# Patient Record
Sex: Female | Born: 1950
Health system: Southern US, Community
[De-identification: ages and names within clinical notes are randomized; demographics above are authoritative.]

## PROBLEM LIST (undated history)

## (undated) DIAGNOSIS — I10 Essential (primary) hypertension: Secondary | ICD-10-CM

## (undated) DIAGNOSIS — E7800 Pure hypercholesterolemia, unspecified: Secondary | ICD-10-CM

## (undated) HISTORY — PX: PARTIAL HYSTERECTOMY: SHX80

---

## 1998-03-11 ENCOUNTER — Ambulatory Visit (HOSPITAL_COMMUNITY): Admission: RE | Admit: 1998-03-11 | Discharge: 1998-03-11 | Payer: Self-pay | Admitting: Cardiology

## 1998-03-31 ENCOUNTER — Emergency Department (HOSPITAL_COMMUNITY): Admission: EM | Admit: 1998-03-31 | Discharge: 1998-03-31 | Payer: Self-pay | Admitting: Internal Medicine

## 1999-07-16 ENCOUNTER — Emergency Department (HOSPITAL_COMMUNITY): Admission: EM | Admit: 1999-07-16 | Discharge: 1999-07-16 | Payer: Self-pay | Admitting: Emergency Medicine

## 1999-11-15 ENCOUNTER — Encounter: Payer: Self-pay | Admitting: Cardiology

## 1999-11-15 ENCOUNTER — Ambulatory Visit (HOSPITAL_COMMUNITY): Admission: RE | Admit: 1999-11-15 | Discharge: 1999-11-15 | Payer: Self-pay | Admitting: Cardiology

## 1999-11-25 ENCOUNTER — Encounter: Admission: RE | Admit: 1999-11-25 | Discharge: 1999-11-25 | Payer: Self-pay | Admitting: Cardiology

## 1999-11-25 ENCOUNTER — Encounter: Payer: Self-pay | Admitting: Cardiology

## 2000-01-12 ENCOUNTER — Observation Stay (HOSPITAL_COMMUNITY): Admission: AD | Admit: 2000-01-12 | Discharge: 2000-01-13 | Payer: Self-pay | Admitting: *Deleted

## 2000-01-12 ENCOUNTER — Encounter: Payer: Self-pay | Admitting: *Deleted

## 2000-03-28 ENCOUNTER — Encounter: Payer: Self-pay | Admitting: Emergency Medicine

## 2000-03-28 ENCOUNTER — Inpatient Hospital Stay (HOSPITAL_COMMUNITY): Admission: EM | Admit: 2000-03-28 | Discharge: 2000-03-31 | Payer: Self-pay | Admitting: Emergency Medicine

## 2000-03-29 ENCOUNTER — Encounter: Payer: Self-pay | Admitting: Cardiovascular Disease

## 2000-03-30 ENCOUNTER — Encounter: Payer: Self-pay | Admitting: Cardiovascular Disease

## 2000-04-25 ENCOUNTER — Encounter: Payer: Self-pay | Admitting: Emergency Medicine

## 2000-04-25 ENCOUNTER — Emergency Department (HOSPITAL_COMMUNITY): Admission: EM | Admit: 2000-04-25 | Discharge: 2000-04-25 | Payer: Self-pay | Admitting: Emergency Medicine

## 2000-05-02 ENCOUNTER — Ambulatory Visit (HOSPITAL_COMMUNITY): Admission: RE | Admit: 2000-05-02 | Discharge: 2000-05-02 | Payer: Self-pay | Admitting: Cardiology

## 2001-01-20 ENCOUNTER — Observation Stay (HOSPITAL_COMMUNITY): Admission: EM | Admit: 2001-01-20 | Discharge: 2001-01-22 | Payer: Self-pay | Admitting: *Deleted

## 2001-01-22 ENCOUNTER — Encounter: Payer: Self-pay | Admitting: Cardiovascular Disease

## 2001-10-11 ENCOUNTER — Encounter: Payer: Self-pay | Admitting: Emergency Medicine

## 2001-10-11 ENCOUNTER — Emergency Department (HOSPITAL_COMMUNITY): Admission: EM | Admit: 2001-10-11 | Discharge: 2001-10-11 | Payer: Self-pay | Admitting: Emergency Medicine

## 2001-12-26 ENCOUNTER — Encounter: Payer: Self-pay | Admitting: Cardiology

## 2001-12-26 ENCOUNTER — Encounter: Admission: RE | Admit: 2001-12-26 | Discharge: 2001-12-26 | Payer: Self-pay | Admitting: Cardiology

## 2002-06-23 ENCOUNTER — Inpatient Hospital Stay (HOSPITAL_COMMUNITY): Admission: EM | Admit: 2002-06-23 | Discharge: 2002-07-02 | Payer: Self-pay | Admitting: Emergency Medicine

## 2002-06-23 ENCOUNTER — Encounter: Payer: Self-pay | Admitting: Emergency Medicine

## 2002-06-25 ENCOUNTER — Encounter: Payer: Self-pay | Admitting: Cardiology

## 2002-06-27 ENCOUNTER — Encounter: Payer: Self-pay | Admitting: Cardiology

## 2002-06-29 ENCOUNTER — Encounter: Payer: Self-pay | Admitting: Cardiology

## 2002-07-01 ENCOUNTER — Encounter: Payer: Self-pay | Admitting: Gastroenterology

## 2002-12-13 ENCOUNTER — Ambulatory Visit (HOSPITAL_COMMUNITY): Admission: RE | Admit: 2002-12-13 | Discharge: 2002-12-13 | Payer: Self-pay | Admitting: Gastroenterology

## 2003-07-09 ENCOUNTER — Encounter: Admission: RE | Admit: 2003-07-09 | Discharge: 2003-07-09 | Payer: Self-pay | Admitting: Cardiology

## 2004-04-24 ENCOUNTER — Emergency Department (HOSPITAL_COMMUNITY): Admission: EM | Admit: 2004-04-24 | Discharge: 2004-04-24 | Payer: Self-pay | Admitting: Emergency Medicine

## 2004-08-06 ENCOUNTER — Ambulatory Visit (HOSPITAL_COMMUNITY): Admission: RE | Admit: 2004-08-06 | Discharge: 2004-08-06 | Payer: Self-pay | Admitting: Cardiology

## 2005-06-13 ENCOUNTER — Ambulatory Visit (HOSPITAL_COMMUNITY): Admission: RE | Admit: 2005-06-13 | Discharge: 2005-06-13 | Payer: Self-pay | Admitting: Cardiology

## 2005-07-13 ENCOUNTER — Encounter: Admission: RE | Admit: 2005-07-13 | Discharge: 2005-07-13 | Payer: Self-pay | Admitting: Occupational Medicine

## 2005-07-18 ENCOUNTER — Ambulatory Visit (HOSPITAL_COMMUNITY): Admission: RE | Admit: 2005-07-18 | Discharge: 2005-07-18 | Payer: Self-pay | Admitting: Cardiology

## 2005-09-05 ENCOUNTER — Ambulatory Visit (HOSPITAL_COMMUNITY): Admission: RE | Admit: 2005-09-05 | Discharge: 2005-09-05 | Payer: Self-pay | Admitting: Sports Medicine

## 2005-09-26 ENCOUNTER — Encounter: Admission: RE | Admit: 2005-09-26 | Discharge: 2005-09-26 | Payer: Self-pay | Admitting: Sports Medicine

## 2005-10-31 ENCOUNTER — Encounter: Admission: RE | Admit: 2005-10-31 | Discharge: 2005-10-31 | Payer: Self-pay | Admitting: Cardiology

## 2006-05-21 ENCOUNTER — Inpatient Hospital Stay (HOSPITAL_COMMUNITY): Admission: EM | Admit: 2006-05-21 | Discharge: 2006-05-23 | Payer: Self-pay | Admitting: Emergency Medicine

## 2006-05-29 ENCOUNTER — Encounter: Admission: RE | Admit: 2006-05-29 | Discharge: 2006-05-29 | Payer: Self-pay | Admitting: Cardiology

## 2006-07-10 ENCOUNTER — Ambulatory Visit (HOSPITAL_BASED_OUTPATIENT_CLINIC_OR_DEPARTMENT_OTHER): Admission: RE | Admit: 2006-07-10 | Discharge: 2006-07-10 | Payer: Self-pay | Admitting: Cardiology

## 2006-07-16 ENCOUNTER — Ambulatory Visit: Payer: Self-pay | Admitting: Internal Medicine

## 2007-06-18 ENCOUNTER — Encounter: Admission: RE | Admit: 2007-06-18 | Discharge: 2007-06-18 | Payer: Self-pay | Admitting: Cardiology

## 2007-12-24 ENCOUNTER — Inpatient Hospital Stay (HOSPITAL_COMMUNITY): Admission: EM | Admit: 2007-12-24 | Discharge: 2007-12-27 | Payer: Self-pay | Admitting: Emergency Medicine

## 2008-01-11 ENCOUNTER — Ambulatory Visit (HOSPITAL_COMMUNITY): Admission: RE | Admit: 2008-01-11 | Discharge: 2008-01-11 | Payer: Self-pay | Admitting: Gastroenterology

## 2008-07-28 ENCOUNTER — Encounter: Admission: RE | Admit: 2008-07-28 | Discharge: 2008-07-28 | Payer: Self-pay | Admitting: Cardiology

## 2008-10-29 ENCOUNTER — Ambulatory Visit (HOSPITAL_COMMUNITY): Admission: RE | Admit: 2008-10-29 | Discharge: 2008-10-29 | Payer: Self-pay | Admitting: Cardiology

## 2009-09-28 ENCOUNTER — Encounter: Admission: RE | Admit: 2009-09-28 | Discharge: 2009-09-28 | Payer: Self-pay | Admitting: Cardiology

## 2010-07-08 ENCOUNTER — Emergency Department (HOSPITAL_COMMUNITY)
Admission: EM | Admit: 2010-07-08 | Discharge: 2010-07-09 | Payer: Self-pay | Source: Home / Self Care | Admitting: Emergency Medicine

## 2010-08-21 ENCOUNTER — Encounter: Payer: Self-pay | Admitting: Cardiology

## 2010-08-22 ENCOUNTER — Encounter: Payer: Self-pay | Admitting: Cardiology

## 2010-08-23 ENCOUNTER — Encounter: Payer: Self-pay | Admitting: Cardiology

## 2010-10-12 LAB — BASIC METABOLIC PANEL
CO2: 27 mEq/L (ref 19–32)
Calcium: 9.6 mg/dL (ref 8.4–10.5)
GFR calc Af Amer: 35 mL/min — ABNORMAL LOW (ref >60)
Sodium: 141 mEq/L (ref 135–145)

## 2010-10-12 LAB — DIFFERENTIAL
Eosinophils Absolute: 0.5 10*3/uL (ref 0.0–0.7)
Lymphocytes Relative: 34 % (ref 12–46)
Lymphs Abs: 2.5 10*3/uL (ref 0.7–4.0)
Monocytes Relative: 10 % (ref 3–12)
Neutro Abs: 3.7 10*3/uL (ref 1.7–7.7)
Neutrophils Relative %: 50 % (ref 43–77)

## 2010-10-12 LAB — CBC
Hemoglobin: 12.7 g/dL (ref 12.0–15.0)
MCH: 29.3 pg (ref 26.0–34.0)
RBC: 4.33 MIL/uL (ref 3.87–5.11)
WBC: 7.4 10*3/uL (ref 4.0–10.5)

## 2010-10-19 ENCOUNTER — Other Ambulatory Visit: Payer: Self-pay | Admitting: Cardiology

## 2010-10-19 DIAGNOSIS — Z1231 Encounter for screening mammogram for malignant neoplasm of breast: Secondary | ICD-10-CM

## 2010-10-27 ENCOUNTER — Ambulatory Visit
Admission: RE | Admit: 2010-10-27 | Discharge: 2010-10-27 | Disposition: A | Discharge status: Home / Self Care | Payer: PRIVATE HEALTH INSURANCE | Source: Ambulatory Visit | Attending: Cardiology | Admitting: Cardiology

## 2010-10-27 DIAGNOSIS — Z1231 Encounter for screening mammogram for malignant neoplasm of breast: Secondary | ICD-10-CM

## 2010-12-14 NOTE — Consult Note (Signed)
Tamara Friedman, REDDOCH                ACCOUNT NO.:  [id number]   MEDICAL RECORD NO.:  [id number]          PATIENT TYPE:  INP   LOCATION:  [id number]                         FACILITY:  [facility]   PHYSICIAN:  Tory Emerald. Benson Norway, MD    DATE OF BIRTH:  07-17-1951   DATE OF CONSULTATION:  12/26/2007  DATE OF DISCHARGE:                                 CONSULTATION   REFERRING PHYSICIAN:  Dr. Wallene Huh.   REASON FOR CONSULTATION:  Hematochezia.   HISTORY OF PRESENT ILLNESS:  This is a 60 year old female with a past  medical history of malignant hypertension and renal insufficiency who is  admitted to the hospital with complaints of acute nausea and vomiting.  Apparently the patient was at a cookout, and at that time she started to  feel ill.  She subsequently developed nausea and vomiting, and she  thought it would pass.  Unfortunately her symptoms continued to persist,  and she presented to the emergency room for further evaluation and  treatment at that time.  She was found to have an elevated blood  pressure with systolic at 123XX123 and diastolic at Q000111Q.  Because of her  symptoms she was subsequently admitted by Dr. Montez Morita, and transferred  to the intensive care unit for a hypertensive emergency.  Since being  hospitalized the patient does complain of having issues with  constipation and hematochezia that started approximately two months ago.  In the past she had a colonoscopy performed for her family history of  colon cancer.  During that examination she reports negative finding.  She denies having any prior hematochezia before this time, and she felt  that it would resolve on its own.  However, it continues to persist  every time she has a difficult bowel movement.  She does have a history  of constipation in the past.  At this time the patient does report  feeling better in regards to having her nausea and vomiting, although  nausea is still slightly persistent.  Additionally she complains of  having bilateral lower abdominal pain which started this past Sunday.  No prior pain before this hypertensive emergency.   PAST MEDICAL HISTORY/PAST SURGICAL HISTORY:  As stated above.   FAMILY HISTORY:  Significant for colon cancer in her mother.   SOCIAL HISTORY:  Negative for alcohol, tobacco or illicit drug use.   ALLERGIES:  No known drug allergies.   MEDICATIONS:  1. Avapro 300 mg p.o. daily.  2. Catapres 0.2 mg p.o. b.i.d.  3. Potassium chloride 20 mEq one p.o. daily.  4. Lasix 40 mg one p.o. daily.  5. Lovenox 70 mg subcu q.12 h.  6. Norvasc 5 mg one p.o. daily.  7. Tekturna 300 mg one p.o. daily.  8. Toprol 50 mg one p.o. b.i.d.  9. Ambien 10 mg one p.o. q.h.s.  10.Xanax 1 mg one p.o. t.i.d.  11.Zofran 4 mg IV q.6 h. p.r.n.   REVIEW OF SYSTEMS:  As stated above in the History of Present Illness;  otherwise, negative.   PHYSICAL EXAMINATION:  VITAL SIGNS:  Blood pressure 124/68, heart rate  60,  respirations 18, temperature 97.8.  GENERAL:  The patient is in no acute distress, alert and oriented.  HEENT:  Normocephalic, atraumatic, extraocular muscles intact.  NECK:  Supple, no lymphadenopathy.  LUNGS:  Clear to auscultation bilaterally.  CARDIOVASCULAR:  Regular rate and rhythm.  ABDOMEN:  Obese, soft, mild tenderness in the bilateral lower quadrants,  no rebound or rigidity, positive bowel sounds.  RECTAL:  Heme-negative.  There is solid stool palpated in the rectal  vault, no masses.  EXTREMITIES:  No clubbing, cyanosis or edema.   LABORATORY VALUES:  White blood cell count 7, hemoglobin 12.3, platelets  210.  Sodium 142, potassium 4.6, chloride 107, CO2 22, BUN 36,  creatinine 1.6, glucose 139.   IMPRESSION:  1. Hematochezia.  2. Constipation.  3. Family history of colon cancer.  4. Malignant hypertension.   PLAN:  1. At the time it appears that she is hemodynamically stable.  I was      not able to detect any blood at this time.  However, further       evaluation is required.  She does have a family history of colon      cancer.  I believe her bleeding is a result of her straining from      her constipation issues.  The pain is new in onset, and I do not      believe it is associated with her current complaints of      hematochezia.  I am not certain as to the exact origin of this pain      at this time.  Her blood pressure is under much better control.      However, I would like to ensure that this is on a consistent basis.      The GI workup does not have to be performed emergently as her      hemoglobin is relatively stable.  I feel that the drop from 15 to      12 in her hemoglobin is as a result of dilutional process and not      actual GI bleeding.  Plan at this time is to hold off on      colonoscopy until blood pressure is under stable control, and again      the colonoscopy will be performed on an outpatient basis.  2. During this hospitalization I will start her on MiraLax 17 g one      p.o. b.i.d. to help with the constipation issue.  At this time I      will follow along.      Tory Emerald Benson Norway, MD  Electronically Signed     PDH/MEDQ  D:  12/26/2007  T:  12/26/2007  Job:  [id number]   cc:   Ardyth Gal. Spruill, M.D.  Fax: [phone]

## 2010-12-17 NOTE — Discharge Summary (Signed)
NAME:  Tamara Friedman, Tamara Friedman                          ACCOUNT NO.:  [id number]   MEDICAL RECORD NO.:  [id number]                   PATIENT TYPE:  INP   LOCATION:  [id number]                                 FACILITY:  [facility]   PHYSICIAN:  Ardyth Gal. Spruill, M.D.             DATE OF BIRTH:  1951-04-29   DATE OF ADMISSION:  06/23/2002  DATE OF DISCHARGE:  07/02/2002                                 DISCHARGE SUMMARY   ADMISSION DIAGNOSES:  1. Malignant hypertension.  2. Acute pancreatitis.  3. Mild renal insufficiency.  4. Leg pain.  5. Ethanol abuse.   DISCHARGE DIAGNOSES:  1. Accelerated malignant hypertension.  2. Acute pancreatitis.  3. ETOH abuse.  4. Mild renal insufficiency.  5. Noncompliance.  6. Nonocclusive coronary artery disease.   BRIEF HISTORY/REASON FOR ADMISSION:  This 60 year old white woman who is  rather noncompliant presented to the hospital emergency room complaining of  abdominal pain.  The patient was admitted and evaluated by Dr. Kevan Ny in  my absence.  The patient had been prescribed medications of clonidine,  hydrochlorothiazide, and Toprol, of which she took sporadically.  On initial  presentation the patient was found to have a blood pressure of 254/154 and  only decreased to 170/110 after some initial treatment.  The patient  complained of some mild headache and some other minor difficulties on  presentation and also complained of some right lower quadrant abdominal  pain.  Due to this patient's history and her initial complaints, she was  admitted to the hospital for further evaluation.  The patient's laboratory  studies revealed the following:  On November 23 white blood cell count 5100,  hemoglobin 14.4, hematocrit 43.2.  At discharge the white blood cell count  was 4000, hemoglobin 12.6, and hematocrit 36.8.  The serum sodium on initial  presentation was 139, potassium 3.8, chloride 103, CO2 content 29, glucose  74, BUN 39, creatinine 1.9.  At discharge  the patient's serum sodium was  134, potassium 4.3, chloride 102, CO2 content 26, glucose 113, BUN 24.  Serum amylase on June 23, 2002 was 297.  Serum lipase was 63.  The  amylase initially increased from 237 to a high of 386, then began to  decrease on December 2 down to 303.   The patient had cardiac enzymes which were unremarkable.  Her lipid profile  revealed a low HDL of 39.  The patient had urine for metanephrines and  catecholamines, which were unremarkable.  Her urine creatinine was 71.  She  had a urine volume of 1200 cc.  The patient's initial urinalysis on June 23, 2002, revealed protein 100 mg.  Subsequent repeat on the following day  generally revealed evidence of protein.  She had greater than 100,000  colonies of urine on the urine culture.  Multiple species were identified,  however.   Electrocardiogram revealed normal sinus rhythm with a first degree AV block,  left atrial enlargement, left ventricular hypertrophy, and a T-wave  abnormality consistent with an inferior ischemia.  Subsequent  electrocardiograms failed to reveal any significant changes.   HOSPITAL COURSE:  The patient was admitted to the hospital with decelerated  hypertension.  Multiple medication adjustments were made to control the  patient's very high blood pressure.  She was initially admitted to the  intensive care unit for acute treatment and extensive follow-up.   The patient was admitted by Dr. Marlou Sa, started on nitroglycerin patches as  well as multiple other medications for her blood pressure.  She was given  clonidine 0.2 mg b.i.d., Norvasc 10 mg daily, hydrochlorothiazide 25 mg  daily, and Toprol XL 50 mg p.o. b.i.d.   A serum amylase was obtained as the patient has a family history of  alcoholism and did complain of some midepigastric and right lower quadrant  abdominal pain.   Additional efforts were made to evaluate whether or not the patient had some  treatable cause of  hypertension, and included vanillylmandelic acid  catecholamines, which were essentially unremarkable.   Diagnostic evaluation of the patient's problem also included radiologic  studies which included an abdominal ultrasound, which revealed no evidence  of gallstones and normal echogenic kidneys.  No specific problems were  identified, even on the patient's acute abdominal series, which revealed a  moderate amount of fecal material within the colon.  The patient's chest x-  ray, however, did reveal mild cardiomegaly, mild probable bronchitis.  No  acute disease was appreciated.   The patient gradually began to respond very slowly to medications for her  hypertension.  Her clonidine had to be increased to 0.4 mg at night and 0.2  mg in the day.  Her Toprol was also increased.   The patient had some mild renal insufficiency on initial presentation.  Eventually the patient's medication became quite effective and began to  control her pressure.   She gradually improved and was able to take in some nutrition, as initially  the patient had become quite anorexic.   The patient slowly began to improve with time.  She began to eat more  without additional episodes of abdominal pain, and it was felt the patient  had likely reached maximum hospital benefit.   The patient on a telemetry report, seemed to have experienced a short run of  semiventricular tachycardia which was seen in the hospitalization but was  not repeated.  This is noted on one occasion while hospitalized, and the  date of this tracing was reported as June 24, 2002.   The patient had no additional episodes of this particular phenomenon.   The patient at the time of discharge appeared stable.  Blood pressure was  stable.  She stated that she would not consume any additional alcohol and  would be sure to be compliant with her medication, as this particular episode frightened her.  The patient will be released home with  office  follow-up in approximately one week and additional evaluation and  adjustments on medications will be performed at that time.  She will be  discharged on Norvasc, clonidine, and Toprol at this particular time.  She  will also have potassium chloride 20 mg, hydrochlorothiazide 25 mg, Protonix  40 mg, Ativan 1 mg, Phenergan and Darvocet as well.  The patient was  scheduled to see Dr. Vennie Homans for additional evaluation after her discharge.   In brief summary this 60 year old woman was admitted to the hospital for  acute pancreatitis,  also found to have severe malignant hypertension.  All  of these problems were addressed while the patient was hospitalized and she  had  also some difficulty with alcohol abuse and depression, and some mild renal  insufficiency while hospitalized.  She has improved, was released home, will  be followed up by gastroenterologist, and will be seen in my office in  approximately 2-3 weeks.                                               Ardyth Gal. Spruill, M.D.    JOS/MEDQ  D:  08/14/2002  T:  08/14/2002  Job:  [id number]

## 2010-12-17 NOTE — Discharge Summary (Signed)
Tamara Friedman, Tamara Friedman                ACCOUNT NO.:  [id number]   MEDICAL RECORD NO.:  [id number]          PATIENT TYPE:  INP   LOCATION:  [place]                         FACILITY:  [facility]   PHYSICIAN:  Ardyth Gal. Spruill, M.D.DATE OF BIRTH:  02-Nov-1950   DATE OF ADMISSION:  05/21/2006  DATE OF DISCHARGE:  05/23/2006                               DISCHARGE SUMMARY   ADMISSION/PROVISIONAL DIAGNOSES:  1. Malignant hypertension.  2. Congestive heart failure.  3. Nausea and vomiting.  4. Headaches.  5. Hypokalemia.  6. Abnormal electrocardiogram.  7. Medications noncompliance.  8. Renal insufficiency.  9. Increased CBG's.   DISCHARGE DIAGNOSES:  1. Malignant hypertension.  2. Congestive heart failure.  3. Hypokalemia.  4. Abnormal electrocardiogram.  5. Renal insufficiency.   BRIEF HISTORY AND REASON FOR ADMISSION:  This is one of several  admissions for this 60 year old black woman with a history of severe  hypertension and noncompliance.  The patient has been on multiple  medications for her hypertension and episodically takes them.  She  reports that she had ran out of medicine approximately two weeks prior  to coming to the hospital.   The patient complained of mild to severe headache, also had complaints  of weakness and some dyspnea and some mild leg swelling.  Due to these  complaints, she was admitted to the hospital for further evaluation and  treatment. When the patient initially presented to the hospital  emergency room her pressure was found to be extremely high and was  recorded in the emergency department well over [id number] systolic and 123456  diastolic blood pressure.  Her weight was 144 pounds.  The patient's  blood pressure was actually 202/135.  By the time the patient left the  emergency department the blood pressure had dipped down to 159/99.   LABORATORY DATA:  Initial laboratory studies revealed the following:  Troponin-I was 0.08.  The urinalysis revealed trace  myoglobin, greater  than 300 mg of protein, a few epithelial cells.  The cholesterol was  recorded at 164 for the patient's total, the LDL cholesterol was 98.  The liver enzymes were all unremarkable.  The B-naturetic peptide was  1,690.  The white blood cell count on May 21, 2006 was 5.4,  hemoglobin 13.6, hematocrit 40.8, and at discharge the hemoglobin was  12.0, hematocrit 35.7, white blood cell count was 5,300.  On initial  presentation as well, on May 21, 2006, the serum sodium was 138,  potassium was 3.0, chloride 101, CO2 content 27, glucose 108, BUN 24,  creatinine 1.7.  At the time of discharge on May 23, 2006 the serum  sodium was 138, potassium 4.1, chloride 109, CO2 content 24, glucose  100, BUN 33, creatinine was 1.9.  The electrocardiogram on initial  presentation revealed biatrial enlargement, left ventricular  hypertrophy, T wave abnormalities consistent with inferior ischemia, T  wave abnormality possible anterior lateral ischemia as well, slightly  prolonged QT interval.   HOSPITAL COURSE:  The patient was admitted to the hospital by Dr. Kevan Ny in my absence.  Dr. Marlou Sa initially evaluated the patient with  baseline laboratory studies which are given in the body of the chart.  She was started on a full liquid diet and was started on a drip for her  blood pressure.  The patient was also given Diovan 320 mg daily,  Clonidine 0.2 mg p.o. b.i.d., Norvasc 5 mg p.o. daily and had a dramatic  decrease in her blood pressure.   She was also given medication for pain and as she was treated for her  blood pressure, her headaches and other symptoms improved.  The patient  was placed on a Nipride drip to keep her systolic between Q000111Q and 0000000  mmHg and diastolic between 90 and 123XX123.   Cardiac enzymes were obtained and did reveal a slight increase in the  patient's troponin-I but this was not thought to be due to myocardial  infarction but may be evidence rather of  heart failure and renal  insufficiency as contributing causes.   The patient's nitroprusside was subsequently tapered down and turned off  as she continued to improve.  She was also noted to have hypokalemia and  this was addressed by Dr. Marlou Sa and given IV potassium.   The patient was placed on Catapres and her blood pressure responded  quite well to this regimen.  She was subsequently transferred to  telemetry on May 22, 2006.  Foley catheter was discontinued.  Followup laboratory did reveal evidence of hypokalemia which was now  corrected and the patient felt she had reached maximal hospital benefit.   It is of note that the patient had a questionable mass in her breast.  A  mammogram was requested.  In addition to this, the nurses reported  apneic periods and an outpatient sleep study was also scheduled.   The patient gradually improved.  Her blood pressure came under better  control and it was felt that she could be released home with office  followup and sample administration by me in the office.  She was  discharged on May 23, 2006 on the following medications:   DISCHARGE MEDICATIONS:  1. Clonidine 0.2 mg p.o. b.i.d.  2. K-Dur 20 mEq p.o. b.i.d.  3. Norvasc 5 mg p.o. q.a.m.  4. Diovan 320 mg p.o. daily.  5. Lopressor 12.5 mg p.o. daily.  6. Protonix 40 mg p.o. daily.   DIET:  She also will be discharged on a 2 gram sodium restricted diet.  No fluid restrictions have been used at this time.   FOLLOWUP:  The patient was given instructions to come by my office for  samples and we will follow her up in my office within one to two weeks.  She will also be given home health care to monitor her blood pressure as  well.     Ardyth Gal. Spruill, M.D.  Electronically Signed    JOS/MEDQ  D:  06/07/2006  T:  06/08/2006  Job:  [id number]

## 2010-12-17 NOTE — Discharge Summary (Signed)
[facility]  Patient:    Tamara Friedman, Tamara Friedman                       MRN: [id number] Adm. Date:  [id number] Disc. Date: [id number] Attending:  Birdie Riddle CC:         Ardyth Gal. Spruill, M.D.   Discharge Summary  PRINCIPAL DIAGNOSES: 1. Accelerated hypertension. 2. Cervical and lumbar sprain. 3. Anxiety.  DISCHARGE MEDICATIONS: 1. Clonidine 0.2 mg 1 three times daily. 2. Hydrochlorothiazide 12.5 mg 1 daily. 3. Xanax 0.25 mg 1 daily. 4. K-Dur 10 mEq 1 daily. 5. Flexeril 10 mg 1/2 tablet 1 twice daily. 6. Percocet 1 p.o. q.i.d. p.r.n. 7. Norvasc 10 mg 1 p.o. daily. 8. Ambien 10 mg 1 at bedtime.  DISCHARGE ACTIVITY:  The patient is to ambulate as tolerated.  DISCHARGE DIET:  Low-fat, low-salt diet.  SPECIAL INSTRUCTIONS:  The patient is to use heating pad at medium heat for 30 minutes twice daily to low back area.  FOLLOWUP:  Dr. Awanda Mink Spruill in two weeks.  The patient is to call [phone] for an appointment.  HISTORY:  This 60 year old black female presented to the emergency room with dizziness, shoulder and back pain.  The patient was found to have blood pressure of 230/133.  She admitted to dietary noncompliance.  PHYSICAL EXAMINATION:  VITAL SIGNS:  Temperature 98, pulse 73, respirations 20, blood pressure 230/133.  Height 5 feet 6 inches, weight 135 pounds.  GENERAL:  The patient is alert and oriented x 3.  HEAD:  Normocephalic, atraumatic.  EYES: Dark Leven with pupils are equal and reactive to light.  Extraocular movements intact.  Conjunctivae pink.  Sclerae nonicteric.  EARS, NOSE, THROAT:  Mucous membranes pink and moist.  NECK:  No JVD, no carotid bruits.  LUNGS:  Clear to auscultation.  HEART:  Normal S1, S2 without murmur, gallop, or rub.  ABDOMEN:  Soft and nontender.  EXTREMITIES:  No edema, cyanosis, clubbing.  CNS:  Cranial nerves II-XII grossly intact.  Bilaterally full grips.  The patient moves all four  extremities.  LABORATORY DATA:  Normal hemoglobin, hematocrit, WBC and platelet count. Normal electrolytes.  BUN slightly high at 39.  Creatinine slightly high at 1.7.  Troponin I 0.03, CK 172, MB 2.9.  Subsequent CK and MB were low also.  Chest x-ray: No acute process.  Chest CT: No dissection.  X-ray of the C spine and lumbar spine essentially unremarkable with a mild degenerative joint disease.  HOSPITAL COURSE:  The patient was admitted to telemetry unit.  She was started on IV nitrite drip.  She responded very well to the nitrite drip overnight, and her home medications of Clonidine, Norvasc, and additionally Toprol XL were started.  The patient had good blood pressure control; however, she continued to have neck pain and back pain with suspicion of atypical angina. The patient underwent adenosine Cardiolite stress test that showed 30% ejection fraction with no perfusion defect.  The patient had daily counselling of dietary compliance; and as a matter of fact, she works in the dietary department.  Her medication doses were adjusted with the finding of first degree and second degree heart blocks, and on March 31, 2000, she was discharged home in satisfactory condition with followup by Dr. Wallene Huh in two weeks. DD:  03/31/00 TD:  04/01/00 Job: [id number]

## 2010-12-17 NOTE — Procedures (Signed)
[place]. [facility]  Patient:    Tamara Friedman, Tamara Friedman                       MRN: [id number] Proc. Date: 01/12/00 Adm. Date:  [id number] Disc. Date: [id number] Attending:  Woody Seller                           Procedure Report  SUBJECTIVE:  This pleasant 60 year old female was brought into the hospital for a family history of colon neoplasm.  The patient has been relatively stable, but a family member was recently diagnosed as having colon cancer. Because of this, the patient came in for an evaluation.  There has been no major history of any change in stool character or color.  No history of any weight loss or abnormalities that were noted at this time.  The family members was a first-generation relative to the patient, which made for increased concern for them from this standpoint.  OBJECTIVE:  She was a pleasant female in no acute distress.  Her vital signs were stable.  Her HEENT examination was anicteric.  The neck was supple.  The lungs were clear.  The heart had a regular rate and rhythm without any murmurs, rubs, or gallops.  The abdomen was soft with no tenderness and no hepatosplenomegaly.  The extremities were within normal limits.  PLAN:  I am presently going to proceed with the colonoscopic examination at this time to ensure no evidence of any neoplastic process that is ongoing. Depending on the results will determine the course of therapy.  INFORMED CONSENT:  The patient was advised of the procedure and the indications and risks involved.  A video was reviewed and consent form obtained.  PREOPERATIVE PREPARATION:  The patient was brought into the endoscopy unit where an IV for IV sedating medication was started.  A monitor was placed on the patient to monitor the patients vital signs and oxygen saturation.  Nasal oxygen at 2 L/min was used and after adequate sedation was performed, the procedure was begun.  BOWEL PREPARATION:  The patient was  given GoLYTELY and Reglan as a bowel prep. The patient tolerated the prep well without any complications.  The quality of the prep was excellent.  DESCRIPTION OF PROCEDURE:  The instrument was advanced with the patient lying in the left lateral position.  There were two polyps approximately 65-70 cm from the proximal colon to the cecal region.  This was confirmed by palpation, transillumination, as well as visualization of the ileoappendiceal orifice.  There appeared to be no gross abnormalities, such as masses, polyps, or stricture lesions appreciated.  The vascular pattern appeared to be well within normal limits throughout the entire colon.  The mucosal pattern showed no evidence of any granular changes and no diverticular changes at this time.  There was evidence of increased tortuosity, especially in the more distal portion of the colon area so that the regular colonoscope was difficult to manipulate.  After advancing approximately 30 cm, the instrument was retracted back and replaced with the pediatric colonoscope for advancement of the instrument all the way through to the cecal region.  The patient tolerated the procedure well with some discomfort, but no major complications noted at this time.  The instrument was removed per rectum without any difficulty.  No evidence of any internal or external hemorrhoids noted.  TREATMENT: 1. Conservative management at this time. 2. Will have  the patient follow up with me in the office. 3. Will recommend repeating the procedure in approximately three years during    the interim. DD:  01/12/00 TD:  01/17/00 Job: [id number]

## 2010-12-17 NOTE — H&P (Signed)
[place]. [facility]  Patient:    Tamara Friedman, Tamara Friedman                       MRN: [id number] Adm. Date:  [id number] Attending:  Woody Seller                         History and Physical  HISTORY OF PRESENT ILLNESS:  This pleasant 59 year old female is admitted into the hospital for 23-hour observation post colonoscopy. She was brought in for evaluation for a primary family history of colon cancer that is noted. She has had no major discomforts that is present at this time. She was unable to arrange transportation to come pick her up at this time; and thus, the patient will be admitted until such time that someone can get her and take her home between tonight and tomorrow morning.  PHYSICAL EXAMINATION:  GENERAL:  She is a pleasant female who appears to be in no acute distress at this point.  VITAL SIGNS:  Stable.  HEENT:  Anicteric.  NECK:  Supple.  LUNGS:  Clear.  HEART:  Regular rate and rhythm without any thrills, murmurs, or gallops.  ABDOMEN:  Soft. No tenderness. No hepatosplenomegaly.  EXTREMITIES:  Appear to be unremarkable.  PROCEDURE:  The patient underwent a colonoscopic examination. Results showed evidence of a normal colon but increased tortuosity that is noted at this time. No biopsies were taken at this time.  PLAN:  The patient will be admitted in for 23-hour observation at this time. She will be discharged when transportation arrives between tonight and tomorrow morning if there are no complications at this point. Routine labs will be obtained; and if the results are okay, discharge will be imminent. DD:  01/12/00 TD:  01/12/00 Job: [id number]

## 2010-12-17 NOTE — Op Note (Signed)
NAME:  Tamara Friedman, Tamara Friedman                          ACCOUNT NO.:  [id number]   MEDICAL RECORD NO.:  [id number]                   PATIENT TYPE:  AMB   LOCATION:  ENDO                                 FACILITY:  [facility]   PHYSICIAN:  Nelwyn Salisbury, M.D.               DATE OF BIRTH:  04/03/1951   DATE OF PROCEDURE:  12/13/2002  DATE OF DISCHARGE:                                 OPERATIVE REPORT   PROCEDURE:  Screening colonoscopy.   ENDOSCOPIST:  Nelwyn Salisbury, M.D.   INSTRUMENT USED:  Olympus video colonoscope.   INDICATION FOR PROCEDURE:  A 60 year old African-American female with a  family history of colon cancer and recent rectal bleeding.  Rule out colonic  polyps, masses, etc.   PREPROCEDURE PREPARATION:  Informed consent was procured from the patient.  The patient had fasted for eight hours prior to the procedure and prepped  with a bottle of magnesium citrate and a gallon of GoLYTELY the night prior  to the procedure.   PREPROCEDURE PHYSICAL:  VITAL SIGNS:  The patient had stable vital signs  except for a blood pressure of 170-180/116-120.  NECK:  Supple.  CHEST:  Clear to auscultation.  S1, S2 regular.  ABDOMEN:  Soft with normal bowel sounds.   DESCRIPTION OF PROCEDURE:  The patient was placed in the left lateral  decubitus position and sedated with 70 mg of Demerol and 7 mg of Versed  intravenously.  Once the patient was adequately sedate and maintained on low-  flow oxygen and continuous cardiac monitoring, the Olympus video colonoscope  was advanced from the rectum to the cecum without difficulties.  The  appendiceal orifice and the ileocecal valve were clearly visualized and  photographed.  Small internal hemorrhoids were seen on retroflexion.  No  masses, polyps, erosions, ulcerations, or diverticula were appreciated.   IMPRESSION:  Essentially normal colonoscopy up to the cecum except for small  internal hemorrhoids.   RECOMMENDATIONS:  1. A high-fiber diet has  been recommended with liberal fluid intake.  Stool     softeners are to be used for bouts of constipation as needed.  2. Repeat CRC screening is recommended in the next five years unless the     patient develops any abnormal symptoms in the interim.  3.     Outpatient follow-up in the next two weeks or earlier if need be.  4. Follow up with Dr. Wallene Huh ASAP for better control of blood     pressure and maybe adjustment of her antihypertensive medication.                                               Nelwyn Salisbury, M.D.    JNM/MEDQ  D:  12/13/2002  T:  12/14/2002  Job:  [id number]   cc:   Ardyth Gal. Spruill, M.D.  [address] [address]  Fax: [phone]

## 2010-12-17 NOTE — Discharge Summary (Signed)
NAMEALLAYA, SONG                ACCOUNT NO.:  [id number]   MEDICAL RECORD NO.:  [id number]          PATIENT TYPE:  INP   LOCATION:  [id number]                         FACILITY:  [facility]   PHYSICIAN:  Ardyth Gal. Spruill, M.D.DATE OF BIRTH:  1950-12-24   DATE OF ADMISSION:  12/24/2007  DATE OF DISCHARGE:  12/27/2007                               DISCHARGE SUMMARY   ADMISSION DIAGNOSES:  1. Malignant hypertension with hypertensive emergency.  2. Questionable renal artery stenosis.  3. Gastrointestinal bleeding.   DISCHARGE DIAGNOSIS:  1. Malignant hypertension.  2. Blood in the stool.  3. Nausea and vomiting.  4. Unspecified constipation.   BRIEF HISTORY AMD REASON FOR ADMISSION:  This is one of several  admissions for this 60 year old black woman who presented to the  hospital complaining of nausea and vomiting.  The patient was initially  evaluated by the emergency room physician with a complaint of abdominal  pain, nausea and vomiting.  While in the emergency room, she was found  to have a blood pressure of 275/172.  She denied any headache or chest  pain.  She was subsequently admitted to the hospital for control of her  severely elevated blood pressure and further evaluation of her abdominal  pain, and her complaint of blood in her stool.   LABORATORY STUDIES:  Laboratory studies revealed the following, the  patient's urinalysis on Dec 26, 2007 revealed small amount of leukocyte  esterase.  She had serum myoglobin on Dec 24, 2007 was 174, CK-MB was  2.6 and troponin was less than 0.05   The patient had VMA and metanephrine testing.  The vanillymandelic acid  was 4.0.  This is within the normal reference range of 1.8-6.7.  Urine  volume was 1350 mL.  Metanephrines in the urine revealed 102.  This was  in the normal range of 90-315 mcg per 24 hours, normal metanephrines  285, reference range 122-676 and total metanephrines in the urine was  387, normal range 224-832.   The  patient had a urine drug screen, which revealed a positive result  for benzodiazepines, a positive result for opiate.  Urine microalbumin  1.15 within the normal range, serum cholesterol revealed HDL cholesterol  of 34, VLDL cholesterol of 17 and a LDL cholesterol of 107, and  triglycerides 85.   Cardiac enzymes revealed total CK of 106, relative index was 3.0.  Subsequent CKs 116 in 1997, no evidence of myocardial infarction.   Serum sodium 142, potassium 4.6, chloride 107, CO2 content 22, glucose  139, BUN 36 and creatinine 1.60.  Estimated glomerular filtration rate  was 33 mL per minute.  Total protein was 8.1, albumin 4.2, AST 22, ALT  20, and ALP 74.  B-type natriuretic peptide 148, normal 0-100.   White blood cell count 10,700 on admission, 7000 at discharge.  Hemoglobin on admission 15, at discharge 12.3.  Serum platelet count 282  on admission, 210 on discharge.  D-dimer was less than 0.22.   RADIOLOGIC STUDIES:  The patient had a renal ultrasound on Dec 26, 2007,  revealed no hydronephrosis, echogenic kidney compatible with chronic  medical kidney renal disease.  Electrocardiogram revealed voltage  criteria for left ventricular hypertrophy, prolonged QT interval.   HOSPITAL COURSE:  The patient was admitted to the hospital to the  intensive care unit because when she was in the emergency room she was  started on IV medication to reduce her blood pressure.  She was started  on nitroprusside by the emergency room physician.   A telemetry bed was obtained.  She was placed in ICU because of the need  for the nitroprusside drip.  She was started on Toprol 50 mg p.o.  b.i.d., clonidine 0.2 mg p.o. t.i.d., Tekturna 300 mg p.o. q.a.m.,  amlodipine 5 mg p.o. q.a.m., Avapro 300 mg p.o. q.a.m., potassium 20 mg  p.o. daily and Lasix 40 mg p.o. q.a.m.  After the oral medication took  effect, the patient's nitroprusside was subsequently discontinued.   An MRI of the patient's renal  artery was requested, however, the patient  had some type of implant in her ear, which precluded her ability to get  the MRA of the renal arteries to exclude renal artery stenosis.   Urine metanephrines were obtained and were found to be essentially  unrevealing.  Eventually, the patient's blood pressure came under better  control.  Urine microalbumin was obtained and surprisingly not very  elevated.   The renal ultrasound was also obtained to assess the renal size and this  was essentially unremarkable.  No evidence of atrophic kidney.   On May 27, the patient's Foley was discontinued.  She began to improve.  She had no abdominal pain and plans were made to release the patient  from the hospital.   The patient did complain of some constipation.  She was given a soapsuds  enema, which was effective and relieved her abdominal pain and she was  released home.  She was started on some MiraLax by Dr. Benson Norway while  hospitalized.  The patient's overall condition improved as her blood  pressure came under better control.  The patient could be released home  and was discharged on the following medications.  1. Toprol XL 50 mg p.o. q.a.m.  2. Avapro 300 mg p.o. q.a.m.  3. Lasix 40 mg p.o. q.a.m.  4. K-Dur 20 mEq p.o. q.a.m.  5. Clonidine 0.2 mg p.o. b.i.d.  6. Tekturna 300 mg p.o. q.a.m.  7. Norvasc 5 mg p.o. q.a.m.   DISCHARGE DIET:  A 4-gram sodium restricted diet.  The patient was given  instructions to return to my office in approximately 2 weeks for  additional followup.   CONDITION ON DISCHARGE:  Improved.  The patient was discharged home  without sequelae.        Ardyth Gal. Spruill, M.D.  Electronically Signed     JOS/MEDQ  D:  01/23/2008  T:  01/24/2008  Job:  [id number]

## 2010-12-17 NOTE — Cardiovascular Report (Signed)
[place]. [facility]  Patient:    Tamara Friedman, Tamara Friedman                       MRN: [id number] Proc. Date: 05/02/00 Adm. Date:  [id number] Attending:  Clent Demark CC:         Ardyth Gal. Spruill, M.D.  Cardiac Catheterization Laboratory   Cardiac Catheterization  PROCEDURE: 1. Left cardiac catheterization with selective right and left coronary    angiography, left ventriculography via right groin using Judkins technique. 2. Aortography and bilateral renal angiogram.  INDICATION FOR PROCEDURE:  Tamara Friedman is a 60 year old black female with a past medical history significant for hypertension, dilated cardiomyopathy, referred by Dr. Montez Morita for cardiac catheterization.  The patient complains of right-sided chest pain radiating to both shoulders associated with nausea. 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  The patient denies any paroxysmal nocturnal dyspnea, orthopnea, or leg swelling.  Denies palpitations, lightheadedness or syncope.  The patient recently had Persantine Cardiolite which showed hypokinesia with EF of 32% with no evidence of ischemia.  The patient denies recent URI or alcohol abuse.  The patient denies fever, chills, cough.  Due to recurrent chest pain and significant LV dysfunction, the patient is referred for invasive evaluation.  PAST MEDICAL HISTORY:  As above.  PAST SURGICAL HISTORY:  She had partial hysterectomy in 1975.  She had ear surgery in 1980.  ALLERGIES:  She is allergic to PENICILLIN.  MEDICATIONS:  She takes Diovan 80/12.5 mg p.o. b.i.d., Toprol XL 25 mg p.o. q.d., Norvasc 10 mg p.o. q.d., clonidine 0.2 mg t.i.d., enteric-coated aspirin one daily, Ambien 10 mg p.o. h.s.  SOCIAL HISTORY:  She is widowed and has one daughter.  Denies tobacco abuse. Drinks a beer occasionally, socially.  Works in [employer].  Born and lives in [place].  FAMILY  HISTORY:  Mother died of colon cancer at the age of 11.  Father, she does not know his cause of death.  One brother is diabetic.  One sister has leukemia.  One son died of stomach cancer.  PHYSICAL EXAMINATION:  On examination, she is alert, awake and oriented x 3 in no acute distress.  Blood pressure was 180/100, pulse was 80, regular. Conjunctiva pink.   Neck:  Supple, no JVD, no bruits.  Lungs clear to auscultation without rhonchi or rales.  Cardiovascular examination: S1 and S1, S2 was normal.  There was a soft systolic murmur.  There was a S4 gallop at the apex.  Abdomen:  Soft, bowel sounds are present, nontender.  Extremities: There is no clubbing, cyanosis or edema.  IMPRESSION:  Recurrent chest pain due to coronary insufficiency, dilated cardiomyopathy.  Rule out ischemic cause.  Uncontrolled hypertension.  Mild renal insufficiency.  Discussed with patient regarding left catheterization, possible PTCA and stenting, its risks, i.e., death, MI, stroke, need for emergency CABG, risk of restenosis, local vascular complications, etc., and consented for the procedure.  DESCRIPTION OF PROCEDURE:  After obtaining the informed consent, the patient was brought to the catheterization lab and was placed on the fluoroscopy table.  The right groin was prepped and draped in the usual fashion. Xylocaine 2% was used for local anesthesia in the right groin.  With the help of thin-walled needle, a 6 French arterial sheath was placed.  The sheath was aspirated and flushed.  Next, a 6 French left Judkins catheter was advanced over the wire under fluoroscopic  guidance up to the ascending aorta.  The wire was pulled out.  The catheter was aspirated and connected to the manifold. The catheter was further advanced and engaged into the left coronary ostium. Multiple views of the left system were taken.  Next, the catheter was disengaged and was pulled out over the wire and was replaced with a 6  French right Judkins catheter which was advanced over the wire under fluoroscopic guidance up to the ascending aorta.  The wire was pulled out.  The catheter was aspirated and connected to the manifold.  The catheter was further advanced and engaged into the right coronary ostium.  Multiple views of the right system were taken.  Next, the catheter was disengaged and was pulled out over the wire and was replaced with a 6 French pigtail catheter which was advanced over the wire under fluoroscopic guidance up to the ascending aorta. The wire was pulled out.  The catheter was aspirated and connected to the manifold.  The catheter was further advanced across the aortic valve into the LV.  LV pressures were recorded.  Next, left ventriculography was done in 30 degree RAO position.  Post angiographic pressures were recorded from LV and then pullback pressures were recorded from aorta.  There was no gradient across the aortic valve.  Next, the pigtail catheter was pulled down into the descending aorta.  Aortography was done in PA position.  Then the pigtail catheter was pulled out over the wire.  The sheaths were aspirated and flushed.  The patient tolerated the procedure well.  FINDINGS:  LV:  The patient has global hypokinesia, EF of 35-40%.  Left main was patent.  LAD has 15-20% mid stenosis beyond diagonal #1.  Ramus was large which was patent.  Left circumflex was small which was patent.  Right coronary artery was patent.  AO-graphy showed bilateral patent renal arteries.  DISPOSITION:  The patient tolerated the procedure well.  There were no complications.  The patient was transferred to recovery in stable condition. DD:  05/02/00 TD:  05/02/00 Job: [id number]

## 2010-12-17 NOTE — Procedures (Signed)
NAME:  Tamara Friedman, Tamara Friedman                ACCOUNT NO.:  [id number]   MEDICAL RECORD NO.:  [id number]          PATIENT TYPE:  OUT   LOCATION:  SLEEP CENTER                 [facility]ACILITY[facility]  [facility]   PHYSICIAN:  Clinton D. Annamaria Boots, MD, FCCP, FACPDATE OF BIRTH:  01-[birth year]-1949   DATE OF STUDY:  07/10/2006                            NOCTURNAL POLYSOMNOGRAM   INDICATION FOR STUDY:  Hypersomnia with sleep apnea.   EPWORTH SLEEPINESS SCORE:  10/24, BMI 24, weight 150 pounds.   MEDICATIONS:  Home medications are listed and reviewed.   SLEEP ARCHITECTURE:  Total sleep time 303 minutes, with sleep efficiency  79%.  Stage I was 4%, stage II 79%, stages III and IV 1%, REM 17% of  total sleep time.  Sleep latency 18 minutes, REM latency 96 minutes,  awake after sleep onset 35 minutes, arousal index 10.5.  Patient took  Zolpidem, Ecotrin, Diclofenac, vitamin E, Exforge, Metoprolol and  Clonidine at 10:00 p.m.   RESPIRATORY DATA:  Apnea/hypopnea index (AHI, RDI), 2.4 obstructive  events per hour, which is within normal limits (normal range 0.5 per  hour).  There were  6 obstructive apneas and 6 hypopneas.  Events were  not positional.  REM AHI 10.6.  There were insufficient events to  qualify for CPAP titration on the study night.   OXYGEN DATA:  Mild to moderate snoring with oxygen desaturation to a  nadir of 88%.  Mean oxygen saturation through the study was 97% on room  air.   CARDIAC DATA:  Variable second-degree AV block, including intervals of  Wenckebach.   MOVEMENT-PARASOMNIA:  A total of 37 limb jerks were recorded of which 13  were associated with arousal or awakening for a periodic limb movement  with arousal index of 2.6 per hour, which is mildly increased and of  uncertain significance.   IMPRESSIONS-RECOMMENDATIONS:  1. Adequate sleep time at architecture.  2. Occasional sleep disorder breathing events, AHI 2.4 per hour      (normal range 0-5 per hour).  Events were not positional and were  associated with mild to moderate snoring with oxygen desaturation      transiently to a nadir of 88%.  3. Intervals of second-degree heart block, including Wenckebach.  4. Mild periodic limb movement with arousal, 2.6 per hour.      Clinton D. Annamaria Boots, MD, [facility], FACP  Diplomate, Tax adviser of Sleep Medicine  Electronically Signed     CDY/MEDQ  D:  07/16/2006 09:15:00  T:  07/16/2006 18:45:48  Job:  [id number]

## 2011-04-27 LAB — CARDIAC PANEL(CRET KIN+CKTOT+MB+TROPI)
CK, MB: 2.4
CK, MB: 3.2
Relative Index: 2.8 — ABNORMAL HIGH
Relative Index: INVALID
Total CK: 116
Total CK: 97

## 2011-04-27 LAB — DIFFERENTIAL
Basophils Absolute: 0.4 — ABNORMAL HIGH
Basophils Relative: 4 — ABNORMAL HIGH
Eosinophils Absolute: 0
Eosinophils Relative: 0
Lymphocytes Relative: 13
Lymphs Abs: 1.4
Monocytes Absolute: 0.3
Monocytes Relative: 3
Neutro Abs: 8.6 — ABNORMAL HIGH
Neutrophils Relative %: 80 — ABNORMAL HIGH

## 2011-04-27 LAB — LIPID PANEL
Cholesterol: 158
HDL: 34 — ABNORMAL LOW
LDL Cholesterol: 107 — ABNORMAL HIGH
Total CHOL/HDL Ratio: 4.6

## 2011-04-27 LAB — MICROALBUMIN, URINE: Microalb, Ur: 1.15

## 2011-04-27 LAB — RAPID URINE DRUG SCREEN, HOSP PERFORMED
Barbiturates: NOT DETECTED
Cocaine: NOT DETECTED

## 2011-04-27 LAB — D-DIMER, QUANTITATIVE

## 2011-04-27 LAB — COMPREHENSIVE METABOLIC PANEL
Albumin: 4.2
Alkaline Phosphatase: 74
BUN: 36 — ABNORMAL HIGH
Calcium: 10.2
Creatinine, Ser: 1.6 — ABNORMAL HIGH
Glucose, Bld: 139 — ABNORMAL HIGH
Total Protein: 8.1

## 2011-04-27 LAB — URINALYSIS, ROUTINE W REFLEX MICROSCOPIC
Ketones, ur: NEGATIVE
Nitrite: NEGATIVE
Protein, ur: NEGATIVE

## 2011-04-27 LAB — CBC
HCT: 36.5
HCT: 45.3
Hemoglobin: 12.3
Hemoglobin: 15
MCHC: 33.1
MCHC: 33.7
MCV: 85.4
MCV: 86.2
Platelets: 210
Platelets: 282
RBC: 4.23
RDW: 13.7
RDW: 14.1
WBC: 10.7 — ABNORMAL HIGH
WBC: 7

## 2011-04-27 LAB — METANEPHRINES, URINE, 24 HOUR
Metaneph Total, Ur: 387 ug/(24.h) (ref 224–832)
Metanephrines, Ur: 102 ug/(24.h) (ref 90–315)
Normetanephrine, 24H Ur: 285 ug/(24.h) (ref 122–676)
Volume, Urine-METAN: 1350

## 2011-04-27 LAB — LIPASE, BLOOD: Lipase: 52

## 2011-04-27 LAB — CK TOTAL AND CKMB (NOT AT ARMC)
CK, MB: 3.2
Relative Index: 3 — ABNORMAL HIGH
Total CK: 106

## 2011-04-27 LAB — B-NATRIURETIC PEPTIDE (CONVERTED LAB): Pro B Natriuretic peptide (BNP): 148 — ABNORMAL HIGH

## 2011-04-27 LAB — POCT CARDIAC MARKERS
Operator id: 294591
Troponin i, poc: <0.05

## 2011-04-27 LAB — TROPONIN I: Troponin I: 0.04

## 2011-04-27 LAB — URINE MICROSCOPIC-ADD ON

## 2011-06-09 ENCOUNTER — Encounter: Payer: Self-pay | Admitting: *Deleted

## 2011-06-09 ENCOUNTER — Emergency Department (HOSPITAL_COMMUNITY)
Admission: EM | Admit: 2011-06-09 | Discharge: 2011-06-09 | Disposition: A | Discharge status: Home / Self Care | Payer: Medicaid Other | Attending: Emergency Medicine | Admitting: Emergency Medicine

## 2011-06-09 DIAGNOSIS — R109 Unspecified abdominal pain: Secondary | ICD-10-CM | POA: Insufficient documentation

## 2011-06-09 DIAGNOSIS — N390 Urinary tract infection, site not specified: Secondary | ICD-10-CM | POA: Insufficient documentation

## 2011-06-09 DIAGNOSIS — I10 Essential (primary) hypertension: Secondary | ICD-10-CM | POA: Insufficient documentation

## 2011-06-09 DIAGNOSIS — Z79899 Other long term (current) drug therapy: Secondary | ICD-10-CM | POA: Insufficient documentation

## 2011-06-09 HISTORY — DX: Essential (primary) hypertension: I10

## 2011-06-09 LAB — URINALYSIS, ROUTINE W REFLEX MICROSCOPIC
Bilirubin Urine: NEGATIVE
Protein, ur: 100 mg/dL — AB
Specific Gravity, Urine: 1.01 (ref 1.005–1.030)
Urobilinogen, UA: 0.2 mg/dL (ref 0.0–1.0)

## 2011-06-09 LAB — URINE MICROSCOPIC-ADD ON

## 2011-06-09 MED ORDER — ONDANSETRON 8 MG PO TBDP
8.0000 mg | ORAL_TABLET | Freq: Once | ORAL | Status: AC
  Administered 2011-06-09: 8 mg via ORAL
  Filled 2011-06-09: qty 1

## 2011-06-09 MED ORDER — ONDANSETRON HCL 4 MG PO TABS: 4.0000 mg | ORAL_TABLET | Freq: Four times a day (QID) | ORAL | Status: AC

## 2011-06-09 MED ORDER — IBUPROFEN 800 MG PO TABS
800.0000 mg | ORAL_TABLET | Freq: Once | ORAL | Status: AC
  Administered 2011-06-09: 800 mg via ORAL
  Filled 2011-06-09: qty 1

## 2011-06-09 MED ORDER — SULFAMETHOXAZOLE-TMP DS 800-160 MG PO TABS
1.0000 | ORAL_TABLET | Freq: Once | ORAL | Status: AC
  Administered 2011-06-09: 1 via ORAL
  Filled 2011-06-09: qty 1

## 2011-06-09 MED ORDER — SULFAMETHOXAZOLE-TRIMETHOPRIM 800-160 MG PO TABS: 1.0000 | ORAL_TABLET | Freq: Two times a day (BID) | ORAL | Status: AC

## 2011-06-09 NOTE — ED Provider Notes (Signed)
History     CSN: [id number] Arrival date & time: 06/09/2011  1:22 AM   First MD Initiated Contact with Patient 06/09/11 0153      Chief Complaint  Patient presents with  . Flank Pain    (Consider location/radiation/quality/duration/timing/severity/associated sxs/prior treatment) HPI  60 year old female presents to emergency department with complaint of bilateral lower back pain and nausea for one week. Patient reports her last bowel movement was Saturday, 5 days ago. Patient reports she's had some pain with urination and has noticed increased frequency with decreased amounts of urine. No fevers no vomiting. Patient reports symptoms similar to prior urinary tract infections.  Past Medical History  Diagnosis Date  . Hypertension     History reviewed. No pertinent past surgical history.  History reviewed. No pertinent family history.  History  Substance Use Topics  . Smoking status: Never Smoker   . Smokeless tobacco: Not on file  . Alcohol Use: No    OB History    Grav Para Term Preterm Abortions TAB SAB Ect Mult Living                  Review of Systems  Constitutional: Negative.   HENT: Negative.   Eyes: Negative.   Respiratory: Negative.   Cardiovascular: Negative.   Gastrointestinal: Positive for nausea and constipation.  Genitourinary: Positive for dysuria, urgency, decreased urine volume and difficulty urinating.  Musculoskeletal: Negative.   Skin: Negative.   Neurological: Negative.   Hematological: Negative.   Psychiatric/Behavioral: Negative.   All other systems reviewed and are negative.    Allergies  Review of patient's allergies indicates no known allergies.  Home Medications   Current Outpatient Rx  Name Route Sig Dispense Refill  . AMLODIPINE BESYLATE PO Oral Take 30 mg by mouth once.     . ASPIRIN 81 MG PO TABS Oral Take 81 mg by mouth daily.     Marland Kitchen CALTRATE 600 PLUS-VIT D PO Oral Take 600 mg by mouth once.      Marland Kitchen CLONIDINE HCL PO Oral  Take 0.2 mg by mouth 3 (three) times daily.     Marland Kitchen LASIX PO Oral Take 40 mg by mouth once.     Marland Kitchen LISINOPRIL PO Oral Take 40 mg by mouth 2 (two) times daily.     Marland Kitchen COZAAR PO Oral Take 100 mg by mouth.     . METOPROLOL SUCCINATE 50 MG PO TB24 Oral Take 50 mg by mouth daily.      Marland Kitchen TEMAZEPAM PO Oral Take 15 mg by mouth once.       BP 109/75  Pulse 93  Temp(Src) 97.6 F (36.4 C) (Oral)  Resp 20  SpO2 100%  Physical Exam  Nursing note and vitals reviewed. Constitutional: She is oriented to person, place, and time. She appears well-developed and well-nourished.       Uncomfortable appearing  HENT:  Head: Normocephalic and atraumatic.  Right Ear: External ear normal.  Left Ear: External ear normal.  Nose: Nose normal.  Mouth/Throat: Oropharynx is clear and moist.  Eyes: Conjunctivae and EOM are normal. Pupils are equal, round, and reactive to light.  Neck: Normal range of motion. Neck supple. No JVD present. No tracheal deviation present. No thyromegaly present.  Cardiovascular: Normal rate, regular rhythm, normal heart sounds and intact distal pulses.  Exam reveals no gallop and no friction rub.   No murmur heard. Pulmonary/Chest: Effort normal and breath sounds normal. No stridor. No respiratory distress. She has no wheezes. She has  no rales. She exhibits no tenderness.  Abdominal: Soft. Bowel sounds are normal. She exhibits no distension and no mass. There is tenderness. There is no rebound and no guarding.       Mild tenderness in suprapubic area  Musculoskeletal: Normal range of motion. She exhibits no edema and no tenderness.  Lymphadenopathy:    She has no cervical adenopathy.  Neurological: She is oriented to person, place, and time. She has normal reflexes. No cranial nerve deficit. She exhibits normal muscle tone. Coordination normal.  Skin: Skin is dry. No rash noted. No erythema. No pallor.  Psychiatric: She has a normal mood and affect. Her behavior is normal. Judgment and  thought content normal.    ED Course  Procedures (including critical care time)  Labs Reviewed  URINALYSIS, ROUTINE W REFLEX MICROSCOPIC - Abnormal; Notable for the following:    Appearance CLOUDY (*)    Hgb urine dipstick SMALL (*)    Protein, ur 100 (*)    Leukocytes, UA SMALL (*)    All other components within normal limits  URINE MICROSCOPIC-ADD ON - Abnormal; Notable for the following:    Squamous Epithelial / LPF MANY (*)    Bacteria, UA MANY (*)    All other components within normal limits        MDM  60 year old female with findings consistent with urinary tract infection. Patient feeling better after medications. We'll discharge him with same       Kalman Drape, MD 06/09/11 [phone]

## 2011-06-09 NOTE — ED Notes (Signed)
Pt in c/o right flank pain with nausea x1 week

## 2011-07-11 ENCOUNTER — Ambulatory Visit: Payer: Medicaid Other | Admitting: Family Medicine

## 2011-08-15 ENCOUNTER — Ambulatory Visit: Payer: Medicaid Other | Admitting: Family Medicine

## 2011-09-19 ENCOUNTER — Ambulatory Visit: Payer: Medicaid Other | Admitting: Sports Medicine

## 2012-06-04 ENCOUNTER — Encounter (HOSPITAL_COMMUNITY): Payer: Self-pay | Admitting: *Deleted

## 2012-06-04 ENCOUNTER — Emergency Department (HOSPITAL_COMMUNITY)
Admission: EM | Admit: 2012-06-04 | Discharge: 2012-06-05 | Disposition: A | Discharge status: Home / Self Care | Payer: Medicaid Other | Attending: Emergency Medicine | Admitting: Emergency Medicine

## 2012-06-04 DIAGNOSIS — Z7982 Long term (current) use of aspirin: Secondary | ICD-10-CM | POA: Insufficient documentation

## 2012-06-04 DIAGNOSIS — R05 Cough: Secondary | ICD-10-CM | POA: Insufficient documentation

## 2012-06-04 DIAGNOSIS — R6883 Chills (without fever): Secondary | ICD-10-CM | POA: Insufficient documentation

## 2012-06-04 DIAGNOSIS — IMO0001 Reserved for inherently not codable concepts without codable children: Secondary | ICD-10-CM | POA: Insufficient documentation

## 2012-06-04 DIAGNOSIS — M545 Low back pain, unspecified: Secondary | ICD-10-CM | POA: Insufficient documentation

## 2012-06-04 DIAGNOSIS — M791 Myalgia, unspecified site: Secondary | ICD-10-CM

## 2012-06-04 DIAGNOSIS — Z79899 Other long term (current) drug therapy: Secondary | ICD-10-CM | POA: Insufficient documentation

## 2012-06-04 DIAGNOSIS — I10 Essential (primary) hypertension: Secondary | ICD-10-CM | POA: Insufficient documentation

## 2012-06-04 DIAGNOSIS — E7800 Pure hypercholesterolemia, unspecified: Secondary | ICD-10-CM | POA: Insufficient documentation

## 2012-06-04 DIAGNOSIS — R059 Cough, unspecified: Secondary | ICD-10-CM | POA: Insufficient documentation

## 2012-06-04 DIAGNOSIS — M25559 Pain in unspecified hip: Secondary | ICD-10-CM | POA: Insufficient documentation

## 2012-06-04 HISTORY — DX: Pure hypercholesterolemia, unspecified: E78.00

## 2012-06-04 NOTE — ED Notes (Signed)
Pt states has had a chest cold x 1 month, went to see her doctor x 1 month ago was put on medication but it has not gotten any better, Pt states "I just can't breathe", pt states coughing at times, denies any mucus, denies chest pain, states having L shoulder/back pain, pt states having nasal congestion and drainage.

## 2012-06-05 ENCOUNTER — Emergency Department (HOSPITAL_COMMUNITY): Payer: Medicaid Other

## 2012-06-05 LAB — URINE MICROSCOPIC-ADD ON

## 2012-06-05 LAB — URINALYSIS, ROUTINE W REFLEX MICROSCOPIC
Glucose, UA: NEGATIVE mg/dL
Nitrite: NEGATIVE
Specific Gravity, Urine: 1.013 (ref 1.005–1.030)
pH: 5.5 (ref 5.0–8.0)

## 2012-06-05 LAB — POCT I-STAT, CHEM 8
BUN: 37 mg/dL — ABNORMAL HIGH (ref 6–23)
Chloride: 110 mEq/L (ref 96–112)
HCT: 39 % (ref 36.0–46.0)
Sodium: 142 mEq/L (ref 135–145)

## 2012-06-05 MED ORDER — ONDANSETRON 8 MG PO TBDP
8.0000 mg | ORAL_TABLET | Freq: Once | ORAL | Status: AC
  Administered 2012-06-05: 8 mg via ORAL
  Filled 2012-06-05: qty 1

## 2012-06-05 MED ORDER — OXYCODONE-ACETAMINOPHEN 5-325 MG PO TABS
1.0000 | ORAL_TABLET | Freq: Once | ORAL | Status: AC
  Administered 2012-06-05: 1 via ORAL
  Filled 2012-06-05: qty 1

## 2012-06-05 MED ORDER — HYDROCODONE-ACETAMINOPHEN 5-325 MG PO TABS: 1.0000 | ORAL_TABLET | Freq: Four times a day (QID) | ORAL | Status: DC | PRN

## 2012-06-05 NOTE — ED Provider Notes (Signed)
History     CSN: [id number]  Arrival date & time 06/04/12  2326   First MD Initiated Contact with Patient 06/05/12 0124      Chief Complaint  Patient presents with  . Nasal Congestion  . Chills     The history is provided by the patient.   The patient reports coughing congestion for one month.  She reports her symptoms continue.  She's now having aches in her low back as well as her bilateral hips.  She also reports aching in her upper back this evening.  She denies neck pain.  She has no recent falls or trauma.  She has no active chest pain at this time.  She reports her chest hurts when she coughs.  She has a history of asthma or CBD.  She has a history of hypertension hypercholesterolemia.  No coronary artery disease history.  She has not called her primary care physician about this.  She denies dysuria and urinary frequency.  She denies nausea vomiting and diarrhea.  Her symptoms are mild in severity.  Nothing worsens or improves her symptoms   Past Medical History  Diagnosis Date  . Hypertension   . High cholesterol     Past Surgical History  Procedure Date  . Partial hysterectomy     History reviewed. No pertinent family history.  History  Substance Use Topics  . Smoking status: Never Smoker   . Smokeless tobacco: Never Used  . Alcohol Use: No    OB History    Grav Para Term Preterm Abortions TAB SAB Ect Mult Living                  Review of Systems  All other systems reviewed and are negative.    Allergies  Review of patient's allergies indicates no known allergies.  Home Medications   Current Outpatient Rx  Name  Route  Sig  Dispense  Refill  . AMLODIPINE BESYLATE 10 MG PO TABS   Oral   Take 10 mg by mouth daily.         . ASPIRIN 81 MG PO TABS   Oral   Take 81 mg by mouth daily.          . ATORVASTATIN CALCIUM 10 MG PO TABS   Oral   Take 10 mg by mouth daily.         Marland Kitchen CLONIDINE HCL 0.2 MG PO TABS   Oral   Take 0.2 mg by mouth 3  (three) times daily.         . FUROSEMIDE 40 MG PO TABS   Oral   Take 40 mg by mouth daily.         Marland Kitchen LISINOPRIL 40 MG PO TABS   Oral   Take 40 mg by mouth 2 (two) times daily.         Marland Kitchen LOSARTAN POTASSIUM 100 MG PO TABS   Oral   Take 100 mg by mouth daily.         Marland Kitchen METOPROLOL SUCCINATE ER 50 MG PO TB24   Oral   Take 50 mg by mouth daily.           Marland Kitchen TEMAZEPAM 15 MG PO CAPS   Oral   Take 15 mg by mouth at bedtime as needed. sleep           BP 110/65  Pulse 78  Temp 97.4 F (36.3 C) (Oral)  Resp 18  SpO2 99%  Physical Exam  Nursing note and vitals reviewed. Constitutional: She is oriented to person, place, and time. She appears well-developed and well-nourished. No distress.  HENT:  Head: Normocephalic and atraumatic.  Eyes: EOM are normal.  Neck: Normal range of motion.       Full range of motion of neck.  No cervical spine tenderness.  No meningeal signs.  Cardiovascular: Normal rate, regular rhythm and normal heart sounds.   Pulmonary/Chest: Effort normal and breath sounds normal.  Abdominal: Soft. She exhibits no distension. There is no tenderness.  Musculoskeletal: Normal range of motion.       Full range of motion bilateral hips.  No thoracic or lumbar tenderness.  Neurological: She is alert and oriented to person, place, and time.  Skin: Skin is warm and dry.  Psychiatric: She has a normal mood and affect. Judgment normal.    ED Course  Procedures (including critical care time)  Labs Reviewed  URINALYSIS, ROUTINE W REFLEX MICROSCOPIC - Abnormal; Notable for the following:    APPearance CLOUDY (*)     Protein, ur 30 (*)     Leukocytes, UA SMALL (*)     All other components within normal limits  POCT I-STAT, CHEM 8 - Abnormal; Notable for the following:    BUN 37 (*)     Creatinine, Ser 1.70 (*)     All other components within normal limits  URINE MICROSCOPIC-ADD ON - Abnormal; Notable for the following:    Squamous Epithelial / LPF FEW  (*)     All other components within normal limits   Dg Chest 2 View  06/05/2012  *RADIOLOGY REPORT*  Clinical Data: Chills and nasal congestion.  CHEST - 2 VIEW  Comparison: Chest radiograph performed 07/08/2010  Findings: The lungs are well-aerated and clear.  There is no evidence of focal opacification, pleural effusion or pneumothorax.  The heart is normal in size; the mediastinal contour is within normal limits.  No acute osseous abnormalities are seen.  A moderate hiatal hernia is noted.  IMPRESSION:  1.  No acute cardiopulmonary process seen. 2.  Moderate hiatal hernia noted.   Original Report Authenticated By: Santa Lighter, M.D.     I personally reviewed the imaging tests through PACS system  I reviewed available ER/hospitalization records thought the EMR  1. Cough   2. Myalgia       MDM  The patient's chest x-ray is clear.  She has had cough.  She also appears to have myalgias.  Much of this sounds viral in nature.  This is not ACS.  Abdomen abdominal exam is benign.  Discharge home with close PCP followup.        Hoy Morn, MD 06/05/12 [phone]

## 2013-07-19 ENCOUNTER — Emergency Department (HOSPITAL_COMMUNITY): Payer: Medicare Other

## 2013-07-19 ENCOUNTER — Encounter (HOSPITAL_COMMUNITY): Payer: Self-pay | Admitting: Emergency Medicine

## 2013-07-19 ENCOUNTER — Emergency Department (HOSPITAL_COMMUNITY)
Admission: EM | Admit: 2013-07-19 | Discharge: 2013-07-20 | Disposition: A | Discharge status: Home / Self Care | Payer: Medicare Other | Attending: Emergency Medicine | Admitting: Emergency Medicine

## 2013-07-19 DIAGNOSIS — N390 Urinary tract infection, site not specified: Secondary | ICD-10-CM | POA: Insufficient documentation

## 2013-07-19 DIAGNOSIS — Z79899 Other long term (current) drug therapy: Secondary | ICD-10-CM | POA: Insufficient documentation

## 2013-07-19 DIAGNOSIS — Z90710 Acquired absence of both cervix and uterus: Secondary | ICD-10-CM | POA: Insufficient documentation

## 2013-07-19 DIAGNOSIS — Z7982 Long term (current) use of aspirin: Secondary | ICD-10-CM | POA: Insufficient documentation

## 2013-07-19 DIAGNOSIS — I10 Essential (primary) hypertension: Secondary | ICD-10-CM | POA: Insufficient documentation

## 2013-07-19 DIAGNOSIS — K5289 Other specified noninfective gastroenteritis and colitis: Secondary | ICD-10-CM | POA: Insufficient documentation

## 2013-07-19 DIAGNOSIS — R51 Headache: Secondary | ICD-10-CM | POA: Insufficient documentation

## 2013-07-19 DIAGNOSIS — E7800 Pure hypercholesterolemia, unspecified: Secondary | ICD-10-CM | POA: Insufficient documentation

## 2013-07-19 DIAGNOSIS — K529 Noninfective gastroenteritis and colitis, unspecified: Secondary | ICD-10-CM

## 2013-07-19 LAB — URINE MICROSCOPIC-ADD ON

## 2013-07-19 LAB — COMPREHENSIVE METABOLIC PANEL
ALT: 13 U/L (ref 0–35)
AST: 13 U/L (ref 0–37)
Alkaline Phosphatase: 107 U/L (ref 39–117)
Calcium: 10.2 mg/dL (ref 8.4–10.5)
Glucose, Bld: 100 mg/dL — ABNORMAL HIGH (ref 70–99)
Potassium: 4.4 mEq/L (ref 3.5–5.1)
Sodium: 139 mEq/L (ref 135–145)
Total Protein: 8.1 g/dL (ref 6.0–8.3)

## 2013-07-19 LAB — URINALYSIS, ROUTINE W REFLEX MICROSCOPIC
Bilirubin Urine: NEGATIVE
Glucose, UA: NEGATIVE mg/dL
Specific Gravity, Urine: 1.012 (ref 1.005–1.030)
pH: 5.5 (ref 5.0–8.0)

## 2013-07-19 LAB — CBC WITH DIFFERENTIAL/PLATELET
Basophils Absolute: 0.1 10*3/uL (ref 0.0–0.1)
Eosinophils Absolute: 0.3 10*3/uL (ref 0.0–0.7)
Eosinophils Relative: 4 % (ref 0–5)
Lymphocytes Relative: 43 % (ref 12–46)
Lymphs Abs: 3.5 10*3/uL (ref 0.7–4.0)
MCH: 29.1 pg (ref 26.0–34.0)
MCV: 89.6 fL (ref 78.0–100.0)
Neutrophils Relative %: 44 % (ref 43–77)
Platelets: 298 10*3/uL (ref 150–400)
RBC: 4.7 MIL/uL (ref 3.87–5.11)
RDW: 13.3 % (ref 11.5–15.5)
WBC: 8.3 10*3/uL (ref 4.0–10.5)

## 2013-07-19 MED ORDER — IOHEXOL 300 MG/ML  SOLN
100.0000 mL | Freq: Once | INTRAMUSCULAR | Status: AC | PRN
  Administered 2013-07-19: 100 mL via INTRAVENOUS

## 2013-07-19 MED ORDER — MORPHINE SULFATE 4 MG/ML IJ SOLN
4.0000 mg | Freq: Once | INTRAMUSCULAR | Status: AC
  Administered 2013-07-19: 4 mg via INTRAVENOUS
  Filled 2013-07-19: qty 1

## 2013-07-19 MED ORDER — ONDANSETRON HCL 4 MG/2ML IJ SOLN
4.0000 mg | Freq: Once | INTRAMUSCULAR | Status: AC
  Administered 2013-07-20: 4 mg via INTRAVENOUS
  Filled 2013-07-19: qty 2

## 2013-07-19 MED ORDER — IOHEXOL 300 MG/ML  SOLN
25.0000 mL | Freq: Once | INTRAMUSCULAR | Status: AC | PRN
  Administered 2013-07-19: 25 mL via ORAL

## 2013-07-19 MED ORDER — PROCHLORPERAZINE EDISYLATE 5 MG/ML IJ SOLN
10.0000 mg | Freq: Once | INTRAMUSCULAR | Status: AC
  Administered 2013-07-20: 10 mg via INTRAVENOUS
  Filled 2013-07-19: qty 2

## 2013-07-19 MED ORDER — MORPHINE SULFATE 4 MG/ML IJ SOLN
4.0000 mg | Freq: Once | INTRAMUSCULAR | Status: AC
  Administered 2013-07-20: 4 mg via INTRAVENOUS
  Filled 2013-07-19: qty 1

## 2013-07-19 MED ORDER — ONDANSETRON HCL 4 MG/2ML IJ SOLN
4.0000 mg | Freq: Once | INTRAMUSCULAR | Status: AC
  Administered 2013-07-19: 4 mg via INTRAVENOUS
  Filled 2013-07-19: qty 2

## 2013-07-19 MED ORDER — SODIUM CHLORIDE 0.9 % IV BOLUS (SEPSIS)
1000.0000 mL | Freq: Once | INTRAVENOUS | Status: AC
  Administered 2013-07-19: 1000 mL via INTRAVENOUS

## 2013-07-19 NOTE — ED Notes (Signed)
Patient states that she is having abodominal pain with N/V/D

## 2013-07-19 NOTE — ED Provider Notes (Signed)
CSN: [id number]     Arrival date & time 07/19/13  1750 History   First MD Initiated Contact with Patient 07/19/13 2010     Chief Complaint  Patient presents with  . Emesis  . Abdominal Pain   (Consider location/radiation/quality/duration/timing/severity/associated sxs/prior Treatment) HPI Comments: Patient is a 62 year old female who presents today for nausea, vomiting, abdominal pain, and diarrhea. This began suddenly at 2 AM. She reports that she had similar symptoms one week ago. When she had the symptoms lasted approximately 2 days. She also reports that she has a headache. The headache has gradually worsened since yesterday. It is a pounding quality and radiates from the back of her head up to her face. She has not taken any medication prior to arrival. She believes her emesis and diarrhea are nonbloody, but states that she has been too sick to notice. She denies fever, chills, shortness of breath, chest pain, photophobia, visual disturbance, urinary urgency, urinary frequency, dysuria.  The history is provided by the patient. No language interpreter was used.    Past Medical History  Diagnosis Date  . Hypertension   . High cholesterol    Past Surgical History  Procedure Laterality Date  . Partial hysterectomy     History reviewed. No pertinent family history. History  Substance Use Topics  . Smoking status: Never Smoker   . Smokeless tobacco: Never Used  . Alcohol Use: No   OB History   Grav Para Term Preterm Abortions TAB SAB Ect Mult Living                 Review of Systems  Constitutional: Negative for fever and chills.  Eyes: Negative for photophobia and visual disturbance.  Respiratory: Negative for shortness of breath.   Cardiovascular: Negative for chest pain.  Gastrointestinal: Positive for nausea, vomiting, abdominal pain and diarrhea.  Neurological: Positive for headaches. Negative for weakness.  All other systems reviewed and are negative.    Allergies   Review of patient's allergies indicates no known allergies.  Home Medications   Current Outpatient Rx  Name  Route  Sig  Dispense  Refill  . amLODipine (NORVASC) 10 MG tablet   Oral   Take 10 mg by mouth daily.         Marland Kitchen aspirin 81 MG tablet   Oral   Take 81 mg by mouth daily.          Marland Kitchen atorvastatin (LIPITOR) 10 MG tablet   Oral   Take 10 mg by mouth daily.         . cloNIDine (CATAPRES) 0.2 MG tablet   Oral   Take 0.2 mg by mouth 3 (three) times daily.         . furosemide (LASIX) 40 MG tablet   Oral   Take 40 mg by mouth daily.         Marland Kitchen HYDROcodone-acetaminophen (NORCO/VICODIN) 5-325 MG per tablet   Oral   Take 1 tablet by mouth every 6 (six) hours as needed for pain.   8 tablet   0   . lisinopril (PRINIVIL,ZESTRIL) 40 MG tablet   Oral   Take 40 mg by mouth 2 (two) times daily.         Marland Kitchen losartan (COZAAR) 100 MG tablet   Oral   Take 100 mg by mouth daily.         . metoprolol (TOPROL-XL) 50 MG 24 hr tablet   Oral   Take 50 mg by mouth  daily.           . temazepam (RESTORIL) 15 MG capsule   Oral   Take 15 mg by mouth at bedtime as needed. sleep          BP 153/63  Pulse 81  Temp(Src) 98.3 F (36.8 C) (Oral)  Resp 18  SpO2 96% Physical Exam  Nursing note and vitals reviewed. Constitutional: She is oriented to person, place, and time. She appears well-developed and well-nourished. No distress.  HENT:  Head: Normocephalic and atraumatic.  Right Ear: External ear normal.  Left Ear: External ear normal.  Nose: Right sinus exhibits frontal sinus tenderness. Right sinus exhibits no maxillary sinus tenderness. Left sinus exhibits frontal sinus tenderness. Left sinus exhibits no maxillary sinus tenderness.  Mouth/Throat: Oropharynx is clear and moist.  No temporal artery tenderness  Eyes: Conjunctivae and EOM are normal. Pupils are equal, round, and reactive to light.  Neck: Normal range of motion.  No nuchal rigidity or meningeal  signs.   Cardiovascular: Normal rate, regular rhythm and normal heart sounds.   Pulmonary/Chest: Effort normal and breath sounds normal. No stridor. No respiratory distress. She has no wheezes. She has no rales.  Abdominal: Soft. She exhibits no distension. There is generalized tenderness.  Musculoskeletal: Normal range of motion.  Neurological: She is alert and oriented to person, place, and time. She has normal strength. Coordination and gait normal.  Skin: Skin is warm and dry. She is not diaphoretic. No erythema.  Psychiatric: She has a normal mood and affect. Her behavior is normal.    ED Course  Procedures (including critical care time) Labs Review Labs Reviewed  COMPREHENSIVE METABOLIC PANEL - Abnormal; Notable for the following:    Glucose, Bld 100 (*)    BUN 24 (*)    Creatinine, Ser 1.42 (*)    GFR calc non Af Amer 38 (*)    GFR calc Af Amer 44 (*)    All other components within normal limits  LIPASE, BLOOD - Abnormal; Notable for the following:    Lipase 74 (*)    All other components within normal limits  URINALYSIS, ROUTINE W REFLEX MICROSCOPIC - Abnormal; Notable for the following:    APPearance CLOUDY (*)    Hgb urine dipstick TRACE (*)    Protein, ur 30 (*)    Leukocytes, UA LARGE (*)    All other components within normal limits  URINE MICROSCOPIC-ADD ON - Abnormal; Notable for the following:    Bacteria, UA MANY (*)    All other components within normal limits  URINE CULTURE  CBC WITH DIFFERENTIAL   Imaging Review Ct Abdomen Pelvis W Contrast  07/19/2013   CLINICAL DATA:  62 year old female with abdominal pelvic pain, nausea and vomiting.  EXAM: CT ABDOMEN AND PELVIS WITH CONTRAST  TECHNIQUE: Multidetector CT imaging of the abdomen and pelvis was performed using the standard protocol following bolus administration of intravenous contrast.  CONTRAST:  100 cc intravenous Omnipaque 300  COMPARISON:  06/29/2002 CT  FINDINGS: The liver, gallbladder, spleen, pancreas  and adrenal glands are unremarkable.  Bilateral renal cortical atrophy is present.  A 2 x 2.2 cm lesion along the lateral mid left kidney measure 75 Hounsfield units and is indeterminate.  Smaller bilateral renal cysts are present.  There is no evidence of free fluid, enlarged lymph nodes, biliary dilation or abdominal aortic aneurysm.  The patient is status post hysterectomy.  Colonic diverticulosis noted without evidence of diverticulitis.  A small umbilical hernia containing a  loop of small bowel is noted without evidence of obstruction.  The appendix and bladder are unremarkable.  Upper limits of normal caliber small bowel loops within the upper abdomen are noted and could represent an enteritis.  There is no evidence of bowel obstruction, pneumoperitoneum or abscess.  No acute or suspicious bony abnormalities are identified.  IMPRESSION: Mildly distended proximal small bowel loops which could represent enteritis.  Moderate to large hiatal hernia.  2 x 2.2 cm indeterminate left renal lesion. Ultrasound may be helpful to determine solid/cystic nature.  Small umbilical hernia containing a loop of small bowel -no evidence of obstruction.   Electronically Signed   By: Hassan Rowan M.D.   On: 07/19/2013 23:28    EKG Interpretation   None       MDM   1. Gastroenteritis   2. UTI (lower urinary tract infection)   3. Headache    Patient is nontoxic, nonseptic appearing, in no apparent distress.  Patient's pain and other symptoms adequately managed in emergency department.  Fluid bolus given.  Labs, imaging and vitals reviewed.  Patient does not meet the SIRS or Sepsis criteria.  On repeat exam patient does not have a surgical abdomen and there are no peritoneal signs.  No indication of appendicitis, bowel obstruction, bowel perforation, cholecystitis, diverticulitis, PID or ectopic pregnancy.  Patient discharged home with symptomatic treatment and given strict instructions for follow-up with their primary care  physician.  I have also discussed reasons to return immediately to the ER.  Patient expresses understanding and agrees with plan. Patient has an appointment with her PCP on Monday. At that time she will schedule an outpatient ultrasound to follow up on renal lesion.  Patient also diagnosed with a UTI. Patient is hemodynamically stable, afebrile, tolerating PO fluids. Stable for discharge with PO abx. Return instructions given.         Elwyn Lade, PA-C 07/20/13 1329

## 2013-07-20 MED ORDER — PROMETHAZINE HCL 25 MG PO TABS: 25.0000 mg | ORAL_TABLET | Freq: Four times a day (QID) | ORAL | Status: DC | PRN

## 2013-07-20 MED ORDER — CEPHALEXIN 500 MG PO CAPS: ORAL_CAPSULE | ORAL | Status: DC

## 2013-07-20 NOTE — ED Provider Notes (Signed)
Medical screening examination/treatment/procedure(s) were performed by non-physician practitioner and as supervising physician I was immediately available for consultation/collaboration.  EKG Interpretation   None         Osvaldo Shipper, MD 07/20/13 2302

## 2013-07-22 ENCOUNTER — Other Ambulatory Visit (HOSPITAL_COMMUNITY): Payer: Self-pay | Admitting: Cardiology

## 2013-07-22 DIAGNOSIS — N281 Cyst of kidney, acquired: Secondary | ICD-10-CM

## 2013-07-22 LAB — URINE CULTURE: Colony Count: >=100000

## 2013-07-24 ENCOUNTER — Ambulatory Visit (HOSPITAL_COMMUNITY)
Admission: RE | Admit: 2013-07-24 | Discharge: 2013-07-24 | Disposition: A | Discharge status: Home / Self Care | Payer: Medicare Other | Source: Ambulatory Visit | Attending: Cardiology | Admitting: Cardiology

## 2013-07-24 DIAGNOSIS — N281 Cyst of kidney, acquired: Secondary | ICD-10-CM | POA: Insufficient documentation

## 2013-09-07 ENCOUNTER — Encounter (HOSPITAL_COMMUNITY): Payer: Self-pay | Admitting: Emergency Medicine

## 2013-09-07 ENCOUNTER — Emergency Department (HOSPITAL_COMMUNITY)
Admission: EM | Admit: 2013-09-07 | Discharge: 2013-09-07 | Disposition: A | Discharge status: Home / Self Care | Payer: Medicare Other | Attending: Emergency Medicine | Admitting: Emergency Medicine

## 2013-09-07 ENCOUNTER — Emergency Department (HOSPITAL_COMMUNITY): Payer: Medicare Other

## 2013-09-07 DIAGNOSIS — I10 Essential (primary) hypertension: Secondary | ICD-10-CM | POA: Insufficient documentation

## 2013-09-07 DIAGNOSIS — J069 Acute upper respiratory infection, unspecified: Secondary | ICD-10-CM

## 2013-09-07 DIAGNOSIS — Z79899 Other long term (current) drug therapy: Secondary | ICD-10-CM | POA: Insufficient documentation

## 2013-09-07 DIAGNOSIS — Z7982 Long term (current) use of aspirin: Secondary | ICD-10-CM | POA: Insufficient documentation

## 2013-09-07 DIAGNOSIS — R0789 Other chest pain: Secondary | ICD-10-CM | POA: Insufficient documentation

## 2013-09-07 DIAGNOSIS — E7800 Pure hypercholesterolemia, unspecified: Secondary | ICD-10-CM | POA: Insufficient documentation

## 2013-09-07 LAB — CBC
HCT: 40.6 % (ref 36.0–46.0)
HEMOGLOBIN: 13.5 g/dL (ref 12.0–15.0)
MCH: 29.7 pg (ref 26.0–34.0)
MCHC: 33.3 g/dL (ref 30.0–36.0)
MCV: 89.2 fL (ref 78.0–100.0)
PLATELETS: 262 10*3/uL (ref 150–400)
RBC: 4.55 MIL/uL (ref 3.87–5.11)
RDW: 13.2 % (ref 11.5–15.5)
WBC: 8.6 10*3/uL (ref 4.0–10.5)

## 2013-09-07 LAB — BASIC METABOLIC PANEL
BUN: 20 mg/dL (ref 6–23)
CALCIUM: 9.3 mg/dL (ref 8.4–10.5)
CHLORIDE: 100 meq/L (ref 96–112)
CO2: 25 meq/L (ref 19–32)
CREATININE: 1.67 mg/dL — AB (ref 0.50–1.10)
GFR calc Af Amer: 36 mL/min — ABNORMAL LOW (ref >90)
GFR calc non Af Amer: 31 mL/min — ABNORMAL LOW (ref >90)
GLUCOSE: 103 mg/dL — AB (ref 70–99)
Potassium: 3.8 mEq/L (ref 3.7–5.3)
Sodium: 140 mEq/L (ref 137–147)

## 2013-09-07 LAB — PRO B NATRIURETIC PEPTIDE: PRO B NATRI PEPTIDE: 130.7 pg/mL — AB (ref 0–125)

## 2013-09-07 LAB — POCT I-STAT TROPONIN I
TROPONIN I, POC: 0 ng/mL (ref 0.00–0.08)
Troponin i, poc: 0.01 ng/mL (ref 0.00–0.08)

## 2013-09-07 MED ORDER — SALINE SPRAY 0.65 % NA SOLN: 1.0000 | NASAL | Status: DC | PRN

## 2013-09-07 MED ORDER — GUAIFENESIN-CODEINE 100-10 MG/5ML PO SOLN: 5.0000 mL | Freq: Four times a day (QID) | ORAL | Status: DC | PRN

## 2013-09-07 MED ORDER — GUAIFENESIN-CODEINE 100-10 MG/5ML PO SOLN
5.0000 mL | Freq: Once | ORAL | Status: AC
Start: 2013-09-07 — End: 2013-09-07
  Administered 2013-09-07: 5 mL via ORAL
  Filled 2013-09-07: qty 5

## 2013-09-07 NOTE — ED Notes (Addendum)
Pt c/o chest congestion, nonproductive cough and vomiting. Pt stated chest congestion and cough started Thursday and then today she started vomiting. Also c/o pain when coughing. Pt was also seen at [facility] ED for gastritis last month. Stated that she has been SOB as well.

## 2013-09-07 NOTE — Discharge Instructions (Signed)
Please follow up with your primary care physician in 1-2 days. If you do not have one please call the [place] number listed above. Your chest x-ray was normal. Your blood work at today's visit was also normal. You have been diagnosed with a viral upper respiratory infection. Please take medications as prescribed. Please do not drive after taking the Robitussin with Codeine as it does contain a narcotic and may make you drowsy. Please read all discharge instructions and return precautions.   Upper Respiratory Infection, Adult An upper respiratory infection (URI) is also sometimes known as the common cold. The upper respiratory tract includes the nose, sinuses, throat, trachea, and bronchi. Bronchi are the airways leading to the lungs. Most people improve within 1 week, but symptoms can last up to 2 weeks. A residual cough may last even longer.  CAUSES Many different viruses can infect the tissues lining the upper respiratory tract. The tissues become irritated and inflamed and often become very moist. Mucus production is also common. A cold is contagious. You can easily spread the virus to others by oral contact. This includes kissing, sharing a glass, coughing, or sneezing. Touching your mouth or nose and then touching a surface, which is then touched by another person, can also spread the virus. SYMPTOMS  Symptoms typically develop 1 to 3 days after you come in contact with a cold virus. Symptoms vary from person to person. They may include:  Runny nose.  Sneezing.  Nasal congestion.  Sinus irritation.  Sore throat.  Loss of voice (laryngitis).  Cough.  Fatigue.  Muscle aches.  Loss of appetite.  Headache.  Low-grade fever. DIAGNOSIS  You might diagnose your own cold based on familiar symptoms, since most people get a cold 2 to 3 times a year. Your caregiver can confirm this based on your exam. Most importantly, your caregiver can check that your symptoms are  not due to another disease such as strep throat, sinusitis, pneumonia, asthma, or epiglottitis. Blood tests, throat tests, and X-rays are not necessary to diagnose a common cold, but they may sometimes be helpful in excluding other more serious diseases. Your caregiver will decide if any further tests are required. RISKS AND COMPLICATIONS  You may be at risk for a more severe case of the common cold if you smoke cigarettes, have chronic heart disease (such as heart failure) or lung disease (such as asthma), or if you have a weakened immune system. The very young and very old are also at risk for more serious infections. Bacterial sinusitis, middle ear infections, and bacterial pneumonia can complicate the common cold. The common cold can worsen asthma and chronic obstructive pulmonary disease (COPD). Sometimes, these complications can require emergency medical care and may be life-threatening. PREVENTION  The best way to protect against getting a cold is to practice good hygiene. Avoid oral or hand contact with people with cold symptoms. Wash your hands often if contact occurs. There is no clear evidence that vitamin C, vitamin E, echinacea, or exercise reduces the chance of developing a cold. However, it is always recommended to get plenty of rest and practice good nutrition. TREATMENT  Treatment is directed at relieving symptoms. There is no cure. Antibiotics are not effective, because the infection is caused by a virus, not by bacteria. Treatment may include:  Increased fluid intake. Sports drinks offer valuable electrolytes, sugars, and fluids.  Breathing heated mist or steam (vaporizer or shower).  Eating chicken soup or other clear broths, and  maintaining good nutrition.  Getting plenty of rest.  Using gargles or lozenges for comfort.  Controlling fevers with ibuprofen or acetaminophen as directed by your caregiver.  Increasing usage of your inhaler if you have asthma. Zinc gel and zinc  lozenges, taken in the first 24 hours of the common cold, can shorten the duration and lessen the severity of symptoms. Pain medicines may help with fever, muscle aches, and throat pain. A variety of non-prescription medicines are available to treat congestion and runny nose. Your caregiver can make recommendations and may suggest nasal or lung inhalers for other symptoms.  HOME CARE INSTRUCTIONS   Only take over-the-counter or prescription medicines for pain, discomfort, or fever as directed by your caregiver.  Use a warm mist humidifier or inhale steam from a shower to increase air moisture. This may keep secretions moist and make it easier to breathe.  Drink enough water and fluids to keep your urine clear or pale yellow.  Rest as needed.  Return to work when your temperature has returned to normal or as your caregiver advises. You may need to stay home longer to avoid infecting others. You can also use a face mask and careful hand washing to prevent spread of the virus. SEEK MEDICAL CARE IF:   After the first few days, you feel you are getting worse rather than better.  You need your caregiver's advice about medicines to control symptoms.  You develop chills, worsening shortness of breath, or Vonstein or red sputum. These may be signs of pneumonia.  You develop yellow or [place] nasal discharge or pain in the face, especially when you bend forward. These may be signs of sinusitis.  You develop a fever, swollen neck glands, pain with swallowing, or white areas in the back of your throat. These may be signs of strep throat. SEEK IMMEDIATE MEDICAL CARE IF:   You have a fever.  You develop severe or persistent headache, ear pain, sinus pain, or chest pain.  You develop wheezing, a prolonged cough, cough up blood, or have a change in your usual mucus (if you have chronic lung disease).  You develop sore muscles or a stiff neck. Document Released: 01/11/2001 Document Revised: 10/10/2011  Document Reviewed: 11/19/2010 [facility] Patient Information 2014 [place], [place].

## 2013-09-07 NOTE — ED Provider Notes (Signed)
CSN: [id number]     Arrival date & time 09/07/13  1652 History   First MD Initiated Contact with Patient 09/07/13 1813     Chief Complaint  Patient presents with  . Cough  . Nasal Congestion  . Nausea   (Consider location/radiation/quality/duration/timing/severity/associated sxs/prior Treatment) HPI Comments: Patient is a 63 year old female past medical history significant for hypertension, hypercholesterolemia presenting to the emergency department for 4 days of sinus pressure and nasal congestion with associated rhinorrhea, productive cough with posttussive chest tightness and nausea with subjective fevers and chills. Patient states her symptoms are worse in the evening and laying down. She denies any alleviating factors. Patient denies any sick contacts. Patient denies any shortness of breath, vomiting, abdominal pain, diarrhea. Patient states she did not take her medications today. Denies any history of echocardiograms, stress test, cardiac catheterizations.   Patient is a 63 y.o. female presenting with cough.  Cough Associated symptoms: chills (subjective), fever (subjective) and rhinorrhea     Past Medical History  Diagnosis Date  . Hypertension   . High cholesterol    Past Surgical History  Procedure Laterality Date  . Partial hysterectomy     No family history on file. History  Substance Use Topics  . Smoking status: Never Smoker   . Smokeless tobacco: Never Used  . Alcohol Use: No   OB History   Grav Para Term Preterm Abortions TAB SAB Ect Mult Living                 Review of Systems  Constitutional: Positive for fever (subjective) and chills (subjective).  HENT: Positive for congestion, rhinorrhea and sinus pressure.   Respiratory: Positive for cough and chest tightness.   Gastrointestinal: Positive for nausea. Negative for vomiting, abdominal pain and diarrhea.  All other systems reviewed and are negative.    Allergies  Review of patient's allergies  indicates no known allergies.  Home Medications   Current Outpatient Rx  Name  Route  Sig  Dispense  Refill  . amLODipine (NORVASC) 10 MG tablet   Oral   Take 10 mg by mouth daily.         Marland Kitchen aspirin 81 MG tablet   Oral   Take 81 mg by mouth daily.          Marland Kitchen atorvastatin (LIPITOR) 10 MG tablet   Oral   Take 10 mg by mouth daily.         . carvedilol (COREG) 12.5 MG tablet   Oral   Take 12.5 mg by mouth 2 (two) times daily with a meal.         . cloNIDine (CATAPRES) 0.2 MG tablet   Oral   Take 0.2 mg by mouth 3 (three) times daily.         . furosemide (LASIX) 40 MG tablet   Oral   Take 40 mg by mouth daily.         Marland Kitchen lisinopril (PRINIVIL,ZESTRIL) 40 MG tablet   Oral   Take 40 mg by mouth 2 (two) times daily.         Marland Kitchen losartan (COZAAR) 100 MG tablet   Oral   Take 100 mg by mouth daily.         . cephALEXin (KEFLEX) 500 MG capsule   Oral   Take 500 mg by mouth See admin instructions. 2 caps po bid x 7 . Finished on 07-03-13         . guaiFENesin-codeine 100-10 MG/5ML syrup  Oral   Take 5 mLs by mouth every 6 (six) hours as needed for cough (chest tightness).   120 mL   0   . sodium chloride (OCEAN) 0.65 % SOLN nasal spray   Each Nare   Place 1 spray into both nostrils as needed for congestion.   60 mL   1    BP 138/84  Pulse 99  Temp(Src) 98.6 F (37 C) (Oral)  Resp 23  Ht 5\' 6"  (1.676 m)  Wt 160 lb (72.576 kg)  BMI 25.84 kg/m2  SpO2 99% Physical Exam  Constitutional: She is oriented to person, place, and time. She appears well-developed and well-nourished. No distress.  HENT:  Head: Normocephalic and atraumatic.  Right Ear: External ear normal.  Left Ear: External ear normal.  Nose: Right sinus exhibits maxillary sinus tenderness and frontal sinus tenderness. Left sinus exhibits maxillary sinus tenderness and frontal sinus tenderness.  Mouth/Throat: Uvula is midline, oropharynx is clear and moist and mucous membranes are normal.  No oropharyngeal exudate.  Eyes: Conjunctivae are normal.  Neck: Normal range of motion. Neck supple.  Cardiovascular: Normal rate, regular rhythm and normal heart sounds.   Pulmonary/Chest: Effort normal and breath sounds normal. No accessory muscle usage. No respiratory distress. She has no wheezes. She has no rales. She exhibits tenderness. She exhibits no bony tenderness, no edema, no deformity, no swelling and no retraction.    Abdominal: Soft. Bowel sounds are normal. There is no tenderness.  Musculoskeletal: Normal range of motion.  Lymphadenopathy:    She has no cervical adenopathy.  Neurological: She is alert and oriented to person, place, and time.  Skin: Skin is warm and dry. She is not diaphoretic.    ED Course  Procedures (including critical care time) Medications  guaiFENesin-codeine 100-10 MG/5ML solution 5 mL (5 mLs Oral Given 09/07/13 1918)    Labs Review Labs Reviewed  BASIC METABOLIC PANEL - Abnormal; Notable for the following:    Glucose, Bld 103 (*)    Creatinine, Ser 1.67 (*)    GFR calc non Af Amer 31 (*)    GFR calc Af Amer 36 (*)    All other components within normal limits  PRO B NATRIURETIC PEPTIDE - Abnormal; Notable for the following:    Pro B Natriuretic peptide (BNP) 130.7 (*)    All other components within normal limits  CBC  POCT I-STAT TROPONIN I  POCT I-STAT TROPONIN I   Imaging Review Dg Chest 2 View  09/07/2013   CLINICAL DATA:  Cough, nasal congestion, chest pressure.  EXAM: CHEST  2 VIEW  COMPARISON:  DG PNEUMONIA CHEST 2V dated 07/08/2010  FINDINGS: Heart is upper limits normal in size. No confluent airspace opacities or effusions. No acute bony abnormality.  IMPRESSION: No active cardiopulmonary disease.   Electronically Signed   By: Rolm Baptise M.D.   On: 09/07/2013 17:50    EKG Interpretation    Date/Time:  Saturday September 07 2013 17:11:52 EST Ventricular Rate:  95 PR Interval:  178 QRS Duration: 84 QT Interval:  364 QTC  Calculation: 457 R Axis:   52 Text Interpretation:  Normal sinus rhythm Possible Left atrial enlargement Nonspecific ST abnormality Abnormal ECG Confirmed by MASNERI  MD, DAVID ([id number]) on 09/07/2013 6:50:35 PM            MDM   1. URI (upper respiratory infection)     Filed Vitals:   09/07/13 2153  BP: 138/84  Pulse: 99  Temp:   Resp: 23  Afebrile, NAD, non-toxic appearing, AAOx4.   Patient is to be discharged with recommendation to follow up with PCP in regards to today's hospital visit. Chest pain is likely chest wall pain related to URI symptoms and post-tussive in nature. It is not likely of cardiac or pulmonary etiology d/t presentation, perc negative, VSS, no tracheal deviation, no JVD or new murmur, RRR, breath sounds equal bilaterally, EKG with non-specific EKG changes but, negative delta troponin after two to three days of CP prior to arrival, and negative CXR. Chest tightness improved after Robitussin with codeine use. Pt has been advised to use symptomatic care and return to the ED is CP becomes exertional, associated with diaphoresis or nausea, radiates to left jaw/arm, worsens or becomes concerning in any way. Pt appears reliable for follow up and is agreeable to discharge.   Case has been discussed with and seen by Dr. Curly Rim who agrees with the above plan to discharge.      Harlow Mares, PA-C 09/08/13 [phone]

## 2013-09-08 NOTE — ED Provider Notes (Signed)
Medical screening examination/treatment/procedure(s) were conducted as a shared visit with non-physician practitioner(s) and myself.  I personally evaluated the patient during the encounter.  EKG Interpretation    Date/Time:  Saturday September 07 2013 17:11:52 EST Ventricular Rate:  95 PR Interval:  178 QRS Duration: 84 QT Interval:  364 QTC Calculation: 457 R Axis:   52 Text Interpretation:  Normal sinus rhythm Possible Left atrial enlargement Nonspecific ST abnormality Abnormal ECG Confirmed by Andriana Casa  MD, Kuulei Kleier ([id number]) on 09/07/2013 6:50:35 PM            Agree with documented note. Patient seen by me as well. Has 2-3 days of chest pain. 2 set troponin negative. Vital signs stable. Patient comfortable with discharge home and outpatient followup. ER precautions given.  Elmer Sow, MD 09/08/13 [phone]

## 2013-12-03 ENCOUNTER — Other Ambulatory Visit: Payer: Self-pay

## 2013-12-03 DIAGNOSIS — Z1231 Encounter for screening mammogram for malignant neoplasm of breast: Secondary | ICD-10-CM

## 2014-02-24 ENCOUNTER — Ambulatory Visit: Payer: Medicare Other

## 2014-10-06 DIAGNOSIS — E785 Hyperlipidemia, unspecified: Secondary | ICD-10-CM | POA: Diagnosis not present

## 2014-10-06 DIAGNOSIS — I5020 Unspecified systolic (congestive) heart failure: Secondary | ICD-10-CM | POA: Diagnosis not present

## 2014-10-06 DIAGNOSIS — I10 Essential (primary) hypertension: Secondary | ICD-10-CM | POA: Diagnosis not present

## 2014-10-06 DIAGNOSIS — N189 Chronic kidney disease, unspecified: Secondary | ICD-10-CM | POA: Diagnosis not present

## 2015-08-17 DIAGNOSIS — I1310 Hypertensive heart and chronic kidney disease without heart failure, with stage 1 through stage 4 chronic kidney disease, or unspecified chronic kidney disease: Secondary | ICD-10-CM | POA: Diagnosis not present

## 2015-08-17 DIAGNOSIS — E785 Hyperlipidemia, unspecified: Secondary | ICD-10-CM | POA: Diagnosis not present

## 2015-08-17 DIAGNOSIS — N189 Chronic kidney disease, unspecified: Secondary | ICD-10-CM | POA: Diagnosis not present

## 2015-11-16 DIAGNOSIS — N189 Chronic kidney disease, unspecified: Secondary | ICD-10-CM | POA: Diagnosis not present

## 2015-11-16 DIAGNOSIS — E785 Hyperlipidemia, unspecified: Secondary | ICD-10-CM | POA: Diagnosis not present

## 2015-11-16 DIAGNOSIS — I1310 Hypertensive heart and chronic kidney disease without heart failure, with stage 1 through stage 4 chronic kidney disease, or unspecified chronic kidney disease: Secondary | ICD-10-CM | POA: Diagnosis not present

## 2016-01-25 DIAGNOSIS — E782 Mixed hyperlipidemia: Secondary | ICD-10-CM | POA: Diagnosis not present

## 2016-01-25 DIAGNOSIS — K51911 Ulcerative colitis, unspecified with rectal bleeding: Secondary | ICD-10-CM | POA: Diagnosis not present

## 2016-01-25 DIAGNOSIS — Z79899 Other long term (current) drug therapy: Secondary | ICD-10-CM | POA: Diagnosis not present

## 2016-01-25 DIAGNOSIS — I10 Essential (primary) hypertension: Secondary | ICD-10-CM | POA: Diagnosis not present

## 2016-02-08 DIAGNOSIS — Z800 Family history of malignant neoplasm of digestive organs: Secondary | ICD-10-CM | POA: Diagnosis not present

## 2016-02-08 DIAGNOSIS — K625 Hemorrhage of anus and rectum: Secondary | ICD-10-CM | POA: Diagnosis not present

## 2016-02-08 DIAGNOSIS — R1084 Generalized abdominal pain: Secondary | ICD-10-CM | POA: Diagnosis not present

## 2016-03-03 DIAGNOSIS — K222 Esophageal obstruction: Secondary | ICD-10-CM | POA: Diagnosis not present

## 2016-03-03 DIAGNOSIS — R1084 Generalized abdominal pain: Secondary | ICD-10-CM | POA: Diagnosis not present

## 2016-03-03 DIAGNOSIS — K5730 Diverticulosis of large intestine without perforation or abscess without bleeding: Secondary | ICD-10-CM | POA: Diagnosis not present

## 2016-03-03 DIAGNOSIS — R1013 Epigastric pain: Secondary | ICD-10-CM | POA: Diagnosis not present

## 2016-03-03 DIAGNOSIS — K625 Hemorrhage of anus and rectum: Secondary | ICD-10-CM | POA: Diagnosis not present

## 2016-03-03 DIAGNOSIS — D123 Benign neoplasm of transverse colon: Secondary | ICD-10-CM | POA: Diagnosis not present

## 2016-03-03 DIAGNOSIS — K449 Diaphragmatic hernia without obstruction or gangrene: Secondary | ICD-10-CM | POA: Diagnosis not present

## 2016-05-09 DIAGNOSIS — N189 Chronic kidney disease, unspecified: Secondary | ICD-10-CM | POA: Diagnosis not present

## 2016-05-09 DIAGNOSIS — I10 Essential (primary) hypertension: Secondary | ICD-10-CM | POA: Diagnosis not present

## 2016-05-09 DIAGNOSIS — Z0000 Encounter for general adult medical examination without abnormal findings: Secondary | ICD-10-CM | POA: Diagnosis not present

## 2016-05-09 DIAGNOSIS — Z23 Encounter for immunization: Secondary | ICD-10-CM | POA: Diagnosis not present

## 2016-05-30 DIAGNOSIS — I10 Essential (primary) hypertension: Secondary | ICD-10-CM | POA: Diagnosis not present

## 2016-05-30 DIAGNOSIS — N189 Chronic kidney disease, unspecified: Secondary | ICD-10-CM | POA: Diagnosis not present

## 2016-05-30 DIAGNOSIS — K51911 Ulcerative colitis, unspecified with rectal bleeding: Secondary | ICD-10-CM | POA: Diagnosis not present

## 2016-07-04 DIAGNOSIS — N189 Chronic kidney disease, unspecified: Secondary | ICD-10-CM | POA: Diagnosis not present

## 2016-07-04 DIAGNOSIS — I1310 Hypertensive heart and chronic kidney disease without heart failure, with stage 1 through stage 4 chronic kidney disease, or unspecified chronic kidney disease: Secondary | ICD-10-CM | POA: Diagnosis not present

## 2016-07-04 DIAGNOSIS — E785 Hyperlipidemia, unspecified: Secondary | ICD-10-CM | POA: Diagnosis not present

## 2016-09-05 DIAGNOSIS — N189 Chronic kidney disease, unspecified: Secondary | ICD-10-CM | POA: Diagnosis not present

## 2016-09-05 DIAGNOSIS — I10 Essential (primary) hypertension: Secondary | ICD-10-CM | POA: Diagnosis not present

## 2016-09-05 DIAGNOSIS — M130 Polyarthritis, unspecified: Secondary | ICD-10-CM | POA: Diagnosis not present

## 2016-09-08 ENCOUNTER — Ambulatory Visit
Admission: RE | Admit: 2016-09-08 | Discharge: 2016-09-08 | Disposition: A | Discharge status: Home / Self Care | Payer: Medicare Other | Source: Ambulatory Visit | Attending: Family Medicine | Admitting: Family Medicine

## 2016-09-08 ENCOUNTER — Other Ambulatory Visit: Payer: Self-pay | Admitting: Family Medicine

## 2016-09-08 DIAGNOSIS — M25552 Pain in left hip: Secondary | ICD-10-CM

## 2016-09-09 DIAGNOSIS — M130 Polyarthritis, unspecified: Secondary | ICD-10-CM | POA: Diagnosis not present

## 2016-09-09 DIAGNOSIS — I10 Essential (primary) hypertension: Secondary | ICD-10-CM | POA: Diagnosis not present

## 2016-10-03 DIAGNOSIS — N189 Chronic kidney disease, unspecified: Secondary | ICD-10-CM | POA: Diagnosis not present

## 2016-10-03 DIAGNOSIS — I1310 Hypertensive heart and chronic kidney disease without heart failure, with stage 1 through stage 4 chronic kidney disease, or unspecified chronic kidney disease: Secondary | ICD-10-CM | POA: Diagnosis not present

## 2016-10-03 DIAGNOSIS — E785 Hyperlipidemia, unspecified: Secondary | ICD-10-CM | POA: Diagnosis not present

## 2016-11-07 ENCOUNTER — Other Ambulatory Visit: Payer: Self-pay | Admitting: Obstetrics and Gynecology

## 2016-11-07 DIAGNOSIS — Z1231 Encounter for screening mammogram for malignant neoplasm of breast: Secondary | ICD-10-CM

## 2016-12-03 DIAGNOSIS — N189 Chronic kidney disease, unspecified: Secondary | ICD-10-CM | POA: Diagnosis not present

## 2016-12-03 DIAGNOSIS — E785 Hyperlipidemia, unspecified: Secondary | ICD-10-CM | POA: Diagnosis not present

## 2016-12-03 DIAGNOSIS — I1310 Hypertensive heart and chronic kidney disease without heart failure, with stage 1 through stage 4 chronic kidney disease, or unspecified chronic kidney disease: Secondary | ICD-10-CM | POA: Diagnosis not present

## 2016-12-12 DIAGNOSIS — M130 Polyarthritis, unspecified: Secondary | ICD-10-CM | POA: Diagnosis not present

## 2016-12-12 DIAGNOSIS — I10 Essential (primary) hypertension: Secondary | ICD-10-CM | POA: Diagnosis not present

## 2017-01-16 ENCOUNTER — Ambulatory Visit: Payer: Medicare Other

## 2017-03-13 ENCOUNTER — Ambulatory Visit: Payer: Medicare Other

## 2017-03-13 DIAGNOSIS — I1310 Hypertensive heart and chronic kidney disease without heart failure, with stage 1 through stage 4 chronic kidney disease, or unspecified chronic kidney disease: Secondary | ICD-10-CM | POA: Diagnosis not present

## 2017-03-13 DIAGNOSIS — N189 Chronic kidney disease, unspecified: Secondary | ICD-10-CM | POA: Diagnosis not present

## 2017-03-13 DIAGNOSIS — E785 Hyperlipidemia, unspecified: Secondary | ICD-10-CM | POA: Diagnosis not present

## 2017-06-05 DIAGNOSIS — H2513 Age-related nuclear cataract, bilateral: Secondary | ICD-10-CM | POA: Diagnosis not present

## 2017-06-05 DIAGNOSIS — H35033 Hypertensive retinopathy, bilateral: Secondary | ICD-10-CM | POA: Diagnosis not present

## 2017-06-20 DIAGNOSIS — I1310 Hypertensive heart and chronic kidney disease without heart failure, with stage 1 through stage 4 chronic kidney disease, or unspecified chronic kidney disease: Secondary | ICD-10-CM | POA: Diagnosis not present

## 2017-06-20 DIAGNOSIS — E785 Hyperlipidemia, unspecified: Secondary | ICD-10-CM | POA: Diagnosis not present

## 2017-06-20 DIAGNOSIS — R002 Palpitations: Secondary | ICD-10-CM | POA: Diagnosis not present

## 2017-06-20 DIAGNOSIS — R55 Syncope and collapse: Secondary | ICD-10-CM | POA: Diagnosis not present

## 2017-06-20 DIAGNOSIS — I509 Heart failure, unspecified: Secondary | ICD-10-CM | POA: Diagnosis not present

## 2017-06-20 DIAGNOSIS — N189 Chronic kidney disease, unspecified: Secondary | ICD-10-CM | POA: Diagnosis not present

## 2017-06-26 DIAGNOSIS — E119 Type 2 diabetes mellitus without complications: Secondary | ICD-10-CM | POA: Diagnosis not present

## 2017-06-26 DIAGNOSIS — I10 Essential (primary) hypertension: Secondary | ICD-10-CM | POA: Diagnosis not present

## 2017-06-26 DIAGNOSIS — E785 Hyperlipidemia, unspecified: Secondary | ICD-10-CM | POA: Diagnosis not present

## 2017-07-17 DIAGNOSIS — I10 Essential (primary) hypertension: Secondary | ICD-10-CM | POA: Diagnosis not present

## 2017-08-07 DIAGNOSIS — I10 Essential (primary) hypertension: Secondary | ICD-10-CM | POA: Diagnosis not present

## 2017-08-24 ENCOUNTER — Other Ambulatory Visit: Payer: Self-pay

## 2017-09-02 ENCOUNTER — Encounter (HOSPITAL_COMMUNITY): Payer: Self-pay | Admitting: Emergency Medicine

## 2017-09-02 ENCOUNTER — Emergency Department (HOSPITAL_COMMUNITY)
Admission: EM | Admit: 2017-09-02 | Discharge: 2017-09-02 | Disposition: A | Discharge status: Home / Self Care | Payer: Medicare Other | Attending: Emergency Medicine | Admitting: Emergency Medicine

## 2017-09-02 ENCOUNTER — Emergency Department (HOSPITAL_COMMUNITY): Payer: Medicare Other

## 2017-09-02 DIAGNOSIS — E7800 Pure hypercholesterolemia, unspecified: Secondary | ICD-10-CM | POA: Diagnosis not present

## 2017-09-02 DIAGNOSIS — N3001 Acute cystitis with hematuria: Secondary | ICD-10-CM | POA: Diagnosis not present

## 2017-09-02 DIAGNOSIS — S3993XA Unspecified injury of pelvis, initial encounter: Secondary | ICD-10-CM | POA: Diagnosis not present

## 2017-09-02 DIAGNOSIS — M79651 Pain in right thigh: Secondary | ICD-10-CM | POA: Insufficient documentation

## 2017-09-02 DIAGNOSIS — M5440 Lumbago with sciatica, unspecified side: Secondary | ICD-10-CM | POA: Insufficient documentation

## 2017-09-02 DIAGNOSIS — M545 Low back pain: Secondary | ICD-10-CM

## 2017-09-02 DIAGNOSIS — Z79899 Other long term (current) drug therapy: Secondary | ICD-10-CM | POA: Diagnosis not present

## 2017-09-02 DIAGNOSIS — Z7982 Long term (current) use of aspirin: Secondary | ICD-10-CM | POA: Diagnosis not present

## 2017-09-02 DIAGNOSIS — M79652 Pain in left thigh: Secondary | ICD-10-CM | POA: Diagnosis not present

## 2017-09-02 DIAGNOSIS — I10 Essential (primary) hypertension: Secondary | ICD-10-CM | POA: Diagnosis not present

## 2017-09-02 LAB — CBC WITH DIFFERENTIAL/PLATELET
BASOS ABS: 0 10*3/uL (ref 0.0–0.1)
Basophils Relative: 1 %
Eosinophils Absolute: 0.2 10*3/uL (ref 0.0–0.7)
Eosinophils Relative: 3 %
HEMATOCRIT: 40.3 % (ref 36.0–46.0)
Hemoglobin: 12.6 g/dL (ref 12.0–15.0)
LYMPHS ABS: 2.7 10*3/uL (ref 0.7–4.0)
LYMPHS PCT: 45 %
MCH: 28.9 pg (ref 26.0–34.0)
MCHC: 31.3 g/dL (ref 30.0–36.0)
MCV: 92.4 fL (ref 78.0–100.0)
MONO ABS: 0.4 10*3/uL (ref 0.1–1.0)
MONOS PCT: 6 %
Neutro Abs: 2.7 10*3/uL (ref 1.7–7.7)
Neutrophils Relative %: 45 %
PLATELETS: 294 10*3/uL (ref 150–400)
RBC: 4.36 MIL/uL (ref 3.87–5.11)
RDW: 13.3 % (ref 11.5–15.5)
WBC: 6 10*3/uL (ref 4.0–10.5)

## 2017-09-02 LAB — URINALYSIS, ROUTINE W REFLEX MICROSCOPIC
BILIRUBIN URINE: NEGATIVE
Glucose, UA: NEGATIVE mg/dL
Ketones, ur: NEGATIVE mg/dL
Nitrite: POSITIVE — AB
Protein, ur: NEGATIVE mg/dL
SPECIFIC GRAVITY, URINE: 1.009 (ref 1.005–1.030)
pH: 5 (ref 5.0–8.0)

## 2017-09-02 LAB — I-STAT CHEM 8, ED
BUN: 25 mg/dL — AB (ref 6–20)
CHLORIDE: 109 mmol/L (ref 101–111)
Calcium, Ion: 1.21 mmol/L (ref 1.15–1.40)
Creatinine, Ser: 1.8 mg/dL — ABNORMAL HIGH (ref 0.44–1.00)
Glucose, Bld: 92 mg/dL (ref 65–99)
HCT: 39 % (ref 36.0–46.0)
Hemoglobin: 13.3 g/dL (ref 12.0–15.0)
Potassium: 4 mmol/L (ref 3.5–5.1)
SODIUM: 142 mmol/L (ref 135–145)
TCO2: 22 mmol/L (ref 22–32)

## 2017-09-02 MED ORDER — HYDROCODONE-ACETAMINOPHEN 5-325 MG PO TABS
1.0000 | ORAL_TABLET | Freq: Once | ORAL | Status: AC
  Administered 2017-09-02: 1 via ORAL
  Filled 2017-09-02: qty 1

## 2017-09-02 MED ORDER — CEPHALEXIN 500 MG PO CAPS: 500.0000 mg | ORAL_CAPSULE | Freq: Two times a day (BID) | ORAL | 0 refills | Status: AC

## 2017-09-02 MED ORDER — HYDROCODONE-ACETAMINOPHEN 5-325 MG PO TABS: 2.0000 | ORAL_TABLET | ORAL | 0 refills | Status: DC | PRN

## 2017-09-02 NOTE — ED Notes (Signed)
Pain much improved

## 2017-09-02 NOTE — Discharge Instructions (Signed)
Please see the information and instructions below regarding your visit.  Your diagnoses today include:  1. Pain in both thighs   2. Acute bilateral low back pain, with sciatica presence unspecified   3. Acute cystitis with hematuria    About diagnosis. Most episodes of acute low back pain, hip and upper leg pain are self-limited. Your exam was reassuring today that the source of your pain is not affecting the spinal cord and nerves that originate in the spinal cord.   If you have a history of disc herniation or arthritis in your spine, the nerves exiting the spine on one side get inflamed. This can cause severe pain. We call this radiculopathy. We do not always know what causes the sudden inflammation.  Tests performed today include: See side panel of your discharge paperwork for testing performed today. Vital signs are listed at the bottom of these instructions.   Medications prescribed:    Take any prescribed medications only as prescribed, and any over the counter medications only as directed on the packaging.  You have been prescribed Norco for pain. This is an opioid pain medication. You may take this medication every 4-6 hours as needed for pain. Only take this medication if you need it for breakthrough pain.   Do not combine this medication with Tylenol, as it may increase the risk of liver problems.  Do not combine this medication with alcohol.  Please be advised to avoid driving or operating heavy machinery while taking this medication, as it may make you drowsy or impair judgment.   Please take all of your antibiotics until finished.   You may develop abdominal discomfort or nausea from the antibiotic. If this occurs, you may take it with food. Some patients also get diarrhea with antibiotics. You may help offset this with probiotics which you can buy or get in yogurt. Do not eat or take the probiotics until 2 hours after your antibiotic. Some women develop vaginal yeast  infections after antibiotics. If you develop unusual vaginal discharge after being on this medication, please see your primary care provider.   Some people develop allergies to antibiotics. Symptoms of antibiotic allergy can be mild and include a flat rash and itching. They can also be more serious and include:  ?Hives - Hives are raised, red patches of skin that are usually very itchy.  ?Lip or tongue swelling  ?Trouble swallowing or breathing  ?Blistering of the skin or mouth.  If you have any of these serious symptoms, please seek emergency medical care immediately.  Home care instructions:   Low back pain gets worse the longer you stay stationary. Please keep moving and walking as tolerated. There are exercises included in this packet to perform as tolerated for your low back pain.   Apply heat to the areas that are painful. Avoid twisting or bending your trunk to lift something. Do not lift anything above 25 lbs while recovering from this flare of low back pain.  Please ensure you are maintaining hydration so that you can clear  this bladder infection.  Please follow any educational materials contained in this packet.   Follow-up instructions: Please follow-up with your primary care provider in one week for further evaluation of your symptoms if they are not completely improved.   Return instructions:  Please return to the Emergency Department if you experience worsening symptoms.  Please return for any fever or chills in the setting of your back pain, weakness in the muscles of the legs,  numbness in your legs and feet that is new or changing, numbness in the area where you wipe, retention of your urine, loss of bowel or bladder control, or problems with walking. Please return for any abdominal pain, or nausea or vomiting that is preventing you from anything down. Please return if you have any other emergent concerns.  Additional Information:   Your vital signs today  were: BP (!) 177/110    Pulse (!) 59    Temp 97.6 F (36.4 C) (Oral)    Resp 18    Ht 5\' 6"  (1.676 m)    Wt 72.6 kg (160 lb)    SpO2 100%    BMI 25.82 kg/m  If your blood pressure (BP) was elevated on multiple readings during this visit above 130 for the top number or above 80 for the bottom number, please have this repeated by your primary care provider within one month. --------------  Thank you for allowing [place] to participate in your care today.

## 2017-09-02 NOTE — ED Notes (Signed)
Pt retujrned from xray

## 2017-09-02 NOTE — ED Notes (Signed)
To x-ray

## 2017-09-02 NOTE — ED Notes (Signed)
The pt is  C/o lower abd and rt thigh pain for 3 days she has seen her doctor who prescribed her tylenol which she reports is not helping  Alert oriented skin warm and dry  No distress she last took tylenol yesterfday

## 2017-09-02 NOTE — ED Triage Notes (Signed)
Pt presents to ED for assessment of bilateral thigh pain radiating up to her hips and lower back.  Patient states she spoke to Dr. Terrence Dupont yesterday, who recommended Tylenol.  Patient states she is now having difficulty walking due to the pain.

## 2017-09-02 NOTE — ED Notes (Signed)
Daughter contacted  Message left on her cell  She is at work  Pt brought over by her grandson

## 2017-09-02 NOTE — ED Notes (Signed)
The pt reports that she fell 3 weeks ago and that is some of the pain she is having

## 2017-09-02 NOTE — ED Provider Notes (Signed)
[facility] Provider Note   CSN: [id number] Arrival date & time: 09/02/17  1536     History   Chief Complaint Chief Complaint  Patient presents with  . Leg Pain  . Abdominal Pain    HPI Tamara Friedman is a 67 y.o. female.  HPI   Patient is a 67 year old female with a history of hypertension, hypercholesterolemia presenting for bilateral thigh pain radiating to her buttocks and low back.  Patient denies ever having similar pain.  Patient reports that she began having the pain 3 days ago when she went to bed.  Patient  describes the pain is sharp and constant and interfering with her ambulation due to the pain.  Patient reports that she called her primary care provider yesterday who recommended she take Tylenol, but it did not provide her relief last night.  Patient reports that she had a fall down a couple steps 3 weeks ago where she fell onto her buttocks and also skinned her bilateral knees.  Patient was able to ambulate after the incident and otherwise recovered well.  Patient denies known history of degenerative disc disease.  Patient denies any fevers, chills, numbness in distal lower extremities, weakness in her distal lower extremities, saddle anesthesia, loss of bowel or bladder control, history of cancer, or IVDU.  Patient denies any color change to her distal lower extremities.  Of note, patient denies taking her antihypertensives today.  Past Medical History:  Diagnosis Date  . High cholesterol   . Hypertension     There are no active problems to display for this patient.   Past Surgical History:  Procedure Laterality Date  . PARTIAL HYSTERECTOMY      OB History    No data available       Home Medications    Prior to Admission medications   Medication Sig Start Date End Date Taking? Authorizing Provider  amLODipine (NORVASC) 10 MG tablet Take 10 mg by mouth daily.    [provider]  aspirin 81 MG tablet Take  81 mg by mouth daily.     [provider]  atorvastatin (LIPITOR) 10 MG tablet Take 10 mg by mouth daily.    [provider]  carvedilol (COREG) 12.5 MG tablet Take 12.5 mg by mouth 2 (two) times daily with a meal.    [provider]  cephALEXin (KEFLEX) 500 MG capsule Take 500 mg by mouth See admin instructions. 2 caps po bid x 7 . Finished on 07-03-13 07/20/13   Cleatrice Burke, PA-C  cloNIDine (CATAPRES) 0.2 MG tablet Take 0.2 mg by mouth 3 (three) times daily.    [provider]  furosemide (LASIX) 40 MG tablet Take 40 mg by mouth daily.    [provider]  guaiFENesin-codeine 100-10 MG/5ML syrup Take 5 mLs by mouth every 6 (six) hours as needed for cough (chest tightness). 09/07/13   Piepenbrink, Anderson Malta, PA-C  lisinopril (PRINIVIL,ZESTRIL) 40 MG tablet Take 40 mg by mouth 2 (two) times daily.    [provider]  losartan (COZAAR) 100 MG tablet Take 100 mg by mouth daily.    [provider]  sodium chloride (OCEAN) 0.65 % SOLN nasal spray Place 1 spray into both nostrils as needed for congestion. 09/07/13   PiepenbrinkAnderson Malta, PA-C    Family History History reviewed. No pertinent family history.  Social History Social History   Tobacco Use  . Smoking status: Never Smoker  . Smokeless tobacco: Never Used  Substance Use Topics  . Alcohol use: No  . Drug use: No     Allergies   Patient has no known allergies.   Review of Systems Review of Systems  Constitutional: Negative for chills and fever.  Respiratory: Negative for chest tightness and shortness of breath.   Cardiovascular: Negative for chest pain and leg swelling.  Gastrointestinal: Negative for abdominal pain, nausea and vomiting.  Genitourinary: Negative for flank pain.  Musculoskeletal: Positive for arthralgias and back pain. Negative for joint swelling.  Skin: Negative for color change and wound.  Neurological: Negative for weakness and numbness.  All  other systems reviewed and are negative.    Physical Exam Updated Vital Signs BP (!) 170/102 (BP Location: Left Arm)   Pulse 82   Temp 97.6 F (36.4 C) (Oral)   Resp 18   Ht 5\' 6"  (1.676 m)   Wt 72.6 kg (160 lb)   SpO2 100%   BMI 25.82 kg/m   Physical Exam  Constitutional: She appears well-developed and well-nourished.  Anxious appearing.  HENT:  Head: Normocephalic and atraumatic.  Mouth/Throat: Oropharynx is clear and moist.  Eyes: Conjunctivae and EOM are normal. Pupils are equal, round, and reactive to light.  Neck: Normal range of motion. Neck supple.  Cardiovascular: Normal rate, regular rhythm, S1 normal and S2 normal.  No murmur heard. Pulmonary/Chest: Effort normal and breath sounds normal. She has no wheezes. She has no rales.  Abdominal: Soft. She exhibits no distension. There is no tenderness. There is no guarding.  Musculoskeletal: Normal range of motion. She exhibits no edema or deformity.  Spine Exam: Inspection/Palpation: No midline tenderness to palpation of cervical, thoracic, or lumbar spine.  There is right paraspinal muscular tenderness.  Additionally, patient exhibits tenderness radiating down to the issue tuberosity bilaterally.  No crepitus.  No step-off. Strength: 5/5 throughout LE bilaterally (hip flexion/extension, adduction/abduction; knee flexion/extension; foot dorsiflexion/plantarflexion, inversion/eversion; great toe inversion) Sensation: Intact to light touch in proximal and distal LE bilaterally Reflexes: 2+ quadriceps and achilles reflexes Compartments of the distal lower extremities are soft and well perfused. Patient walks with a slightly antalgic gait favoring the right, but no evidence of weakness or foot drop.  Lymphadenopathy:    She has no cervical adenopathy.  Neurological: She is alert.  Cranial nerves grossly intact. Patient moves extremities symmetrically and with good coordination.  Skin: Skin is warm and dry. No rash noted. No  erythema.  Psychiatric: She has a normal mood and affect. Her behavior is normal. Judgment and thought content normal.  Nursing note and vitals reviewed.    ED Treatments / Results  Labs (all labs ordered are listed, but only abnormal results are displayed) Labs Reviewed  CBC WITH DIFFERENTIAL/PLATELET  I-STAT CHEM 8, ED    EKG  EKG Interpretation None       Radiology No results found.  Procedures Procedures (including critical care time)  Medications Ordered in ED Medications  HYDROcodone-acetaminophen (NORCO/VICODIN) 5-325 MG per tablet 1 tablet (not administered)     Initial Impression / Assessment and Plan / ED Course  I have reviewed the triage vital signs and the nursing notes.  Pertinent labs & imaging results that were available during my care of the patient were reviewed by me and considered in my medical decision making (see chart for details).  Clinical Course as of Sep 03 225  Sat Sep 02, 2017  1922 Bacteria, UA: (!) RARE [AM]    Clinical Course User Index [AM] Albesa Seen, [place]  Final Clinical Impressions(s) / ED Diagnoses   Final diagnoses:  Pain in both thighs  Acute bilateral low back pain, with sciatica presence unspecified  Acute cystitis with hematuria   Patient is nontoxic-appearing, afebrile, but anxious regarding her symptoms.  Patient denies any abdominal pain at this time.  Patient reports that she is having thigh pain that is radiating upwards to her spine.  Patient with a history of a fall 3 weeks ago.  Concern for possible new compression fractures causing radicular symptoms at this time.  Differential diagnosis also includes degenerative disc disease and radicular symptoms. Abdomen is soft and minimally tender in suprapubic region.  Therefore doubt acute abdominal pathologies as cause of patient's back, buttock and leg pain.  Also doubt acute arterial occlusion as cause of patient's thigh pain, as distal extremities are well  perfused, DP pulses are 2+, and there is no pallor to distal lower extremities.  Doubt septic arthritis or avascular necrosis of the hips, as there is no erythema, edema of the hips, and will assess for leukocytosis.  There are no red flag signs or symptoms suggestive of cauda equina.  Will initiate CBC, BMP, x-ray lumbar spine as well as of pelvis.  Will await urinalysis to assess suprapubic pain.  Patient case discussed with Dr. Dene Gentry.  X-rays of the lumbar spine and pelvis demonstrate no acute findings.  Patient noted to have TNTC WBCs as well as positive nitrite on urinalysis, but minimal bacteria.  We will treat this empirically and culture the urine.  Additionally, patient noted to have slightly elevated creatinine on i-STAT Chem-8.  I discussed with patient that in a follow-up with her primary care provider, I would like creatinine rechecked.  Also recommended recheck of urinalysis to ensure resolution of slight hematuria.  Patient demonstrates clinical improvement with Norco.  Patient verified safe ride prior to prescribing Norco in the emergency department.  I have reviewed the patient's information in the [facility] for the past 12 months and found them to have no Rx in past 2 years.  Opiates were prescribed for an acute, painful condition. The patient was given information on side effects and encouraged to use other, non-opiate pain medication primary, only using opiate medicine sparingly for severe pain.  Elevated blood pressure noted today.  Patient did not take any of her antihypertensives today.  Patient not experiencing any shortness of breath, chest pain, or encephalopathic symptoms.  Patient reporting she will take her blood pressure medication when she returns home.  ED Discharge Orders    None       Tamala Julian 09/03/17 [id number]    Valarie Merino, MD 09/04/17 [phone]

## 2017-09-04 DIAGNOSIS — N3090 Cystitis, unspecified without hematuria: Secondary | ICD-10-CM | POA: Diagnosis not present

## 2017-09-04 DIAGNOSIS — I1310 Hypertensive heart and chronic kidney disease without heart failure, with stage 1 through stage 4 chronic kidney disease, or unspecified chronic kidney disease: Secondary | ICD-10-CM | POA: Diagnosis not present

## 2017-09-04 DIAGNOSIS — N189 Chronic kidney disease, unspecified: Secondary | ICD-10-CM | POA: Diagnosis not present

## 2017-09-04 DIAGNOSIS — I509 Heart failure, unspecified: Secondary | ICD-10-CM | POA: Diagnosis not present

## 2017-09-04 DIAGNOSIS — E785 Hyperlipidemia, unspecified: Secondary | ICD-10-CM | POA: Diagnosis not present

## 2017-09-04 LAB — URINE CULTURE

## 2017-09-11 DIAGNOSIS — E785 Hyperlipidemia, unspecified: Secondary | ICD-10-CM | POA: Diagnosis not present

## 2017-09-11 DIAGNOSIS — I10 Essential (primary) hypertension: Secondary | ICD-10-CM | POA: Diagnosis not present

## 2017-09-11 DIAGNOSIS — N3091 Cystitis, unspecified with hematuria: Secondary | ICD-10-CM | POA: Diagnosis not present

## 2017-10-03 ENCOUNTER — Other Ambulatory Visit: Payer: Self-pay | Admitting: Cardiology

## 2017-10-03 DIAGNOSIS — Z1231 Encounter for screening mammogram for malignant neoplasm of breast: Secondary | ICD-10-CM

## 2017-10-09 DIAGNOSIS — I10 Essential (primary) hypertension: Secondary | ICD-10-CM | POA: Diagnosis not present

## 2017-10-09 DIAGNOSIS — R7309 Other abnormal glucose: Secondary | ICD-10-CM | POA: Diagnosis not present

## 2017-10-09 DIAGNOSIS — E782 Mixed hyperlipidemia: Secondary | ICD-10-CM | POA: Diagnosis not present

## 2017-10-09 DIAGNOSIS — R739 Hyperglycemia, unspecified: Secondary | ICD-10-CM | POA: Diagnosis not present

## 2017-10-09 DIAGNOSIS — N189 Chronic kidney disease, unspecified: Secondary | ICD-10-CM | POA: Diagnosis not present

## 2018-01-08 ENCOUNTER — Ambulatory Visit
Admission: RE | Admit: 2018-01-08 | Discharge: 2018-01-08 | Disposition: A | Discharge status: Home / Self Care | Payer: Medicare Other | Source: Ambulatory Visit | Attending: Cardiology | Admitting: Cardiology

## 2018-01-08 DIAGNOSIS — Z1231 Encounter for screening mammogram for malignant neoplasm of breast: Secondary | ICD-10-CM

## 2018-01-22 DIAGNOSIS — M189 Osteoarthritis of first carpometacarpal joint, unspecified: Secondary | ICD-10-CM | POA: Diagnosis not present

## 2018-01-22 DIAGNOSIS — R739 Hyperglycemia, unspecified: Secondary | ICD-10-CM | POA: Diagnosis not present

## 2018-01-22 DIAGNOSIS — N189 Chronic kidney disease, unspecified: Secondary | ICD-10-CM | POA: Diagnosis not present

## 2018-01-22 DIAGNOSIS — I10 Essential (primary) hypertension: Secondary | ICD-10-CM | POA: Diagnosis not present

## 2018-01-22 DIAGNOSIS — E782 Mixed hyperlipidemia: Secondary | ICD-10-CM | POA: Diagnosis not present

## 2018-02-26 DIAGNOSIS — M1990 Unspecified osteoarthritis, unspecified site: Secondary | ICD-10-CM | POA: Diagnosis not present

## 2018-02-26 DIAGNOSIS — N189 Chronic kidney disease, unspecified: Secondary | ICD-10-CM | POA: Diagnosis not present

## 2018-02-26 DIAGNOSIS — I509 Heart failure, unspecified: Secondary | ICD-10-CM | POA: Diagnosis not present

## 2018-02-26 DIAGNOSIS — E785 Hyperlipidemia, unspecified: Secondary | ICD-10-CM | POA: Diagnosis not present

## 2018-02-26 DIAGNOSIS — I10 Essential (primary) hypertension: Secondary | ICD-10-CM | POA: Diagnosis not present

## 2018-04-22 ENCOUNTER — Emergency Department (HOSPITAL_COMMUNITY)
Admission: EM | Admit: 2018-04-22 | Discharge: 2018-04-23 | Disposition: A | Discharge status: Home / Self Care | Payer: Medicare Other | Attending: Emergency Medicine | Admitting: Emergency Medicine

## 2018-04-22 ENCOUNTER — Other Ambulatory Visit: Payer: Self-pay

## 2018-04-22 ENCOUNTER — Encounter (HOSPITAL_COMMUNITY): Payer: Self-pay | Admitting: Emergency Medicine

## 2018-04-22 DIAGNOSIS — Z79899 Other long term (current) drug therapy: Secondary | ICD-10-CM | POA: Insufficient documentation

## 2018-04-22 DIAGNOSIS — J40 Bronchitis, not specified as acute or chronic: Secondary | ICD-10-CM | POA: Diagnosis not present

## 2018-04-22 DIAGNOSIS — I440 Atrioventricular block, first degree: Secondary | ICD-10-CM | POA: Diagnosis not present

## 2018-04-22 DIAGNOSIS — Z7982 Long term (current) use of aspirin: Secondary | ICD-10-CM | POA: Insufficient documentation

## 2018-04-22 DIAGNOSIS — R0989 Other specified symptoms and signs involving the circulatory and respiratory systems: Secondary | ICD-10-CM | POA: Diagnosis not present

## 2018-04-22 DIAGNOSIS — R0981 Nasal congestion: Secondary | ICD-10-CM | POA: Diagnosis present

## 2018-04-22 DIAGNOSIS — I10 Essential (primary) hypertension: Secondary | ICD-10-CM | POA: Diagnosis not present

## 2018-04-22 NOTE — ED Triage Notes (Signed)
C/o headache, runny nose, nasal congestion, and dry cough x 1 week.  Taking Tylenol and Benadryl without relief.

## 2018-04-23 ENCOUNTER — Emergency Department (HOSPITAL_COMMUNITY): Payer: Medicare Other

## 2018-04-23 ENCOUNTER — Other Ambulatory Visit: Payer: Self-pay

## 2018-04-23 DIAGNOSIS — R0989 Other specified symptoms and signs involving the circulatory and respiratory systems: Secondary | ICD-10-CM | POA: Diagnosis not present

## 2018-04-23 MED ORDER — BENZONATATE 100 MG PO CAPS: 100.0000 mg | ORAL_CAPSULE | Freq: Three times a day (TID) | ORAL | 0 refills | Status: DC | PRN

## 2018-04-23 MED ORDER — PREDNISONE 20 MG PO TABS: 40.0000 mg | ORAL_TABLET | Freq: Every day | ORAL | 0 refills | Status: DC

## 2018-04-23 MED ORDER — AMOXICILLIN 500 MG PO CAPS: 1000.0000 mg | ORAL_CAPSULE | Freq: Two times a day (BID) | ORAL | 0 refills | Status: DC

## 2018-04-23 MED ORDER — FLUTICASONE PROPIONATE 50 MCG/ACT NA SUSP: 1.0000 | Freq: Every day | NASAL | 0 refills | Status: DC

## 2018-04-23 NOTE — ED Provider Notes (Signed)
[facility] DEPART[facility] Provider Note   CSN: [id number] Arrival date & time: 04/22/18  2153     History   Chief Complaint Chief Complaint  Patient presents with  . Hypertension  . Nasal Congestion  . Headache  . Cough    HPI Tamara Friedman is a 67 y.o. female.  Patient reports that she has been sick for a week.  She has had severe nasal congestion, headache, cough and chest congestion.  Cough is nonproductive.  She has been taking Tylenol and Benadryl but cannot get any relief.  She has not had nausea, vomiting or diarrhea.  She reports that her granddaughter was diagnosed with strep yesterday.     Past Medical History:  Diagnosis Date  . High cholesterol   . Hypertension     There are no active problems to display for this patient.   Past Surgical History:  Procedure Laterality Date  . PARTIAL HYSTERECTOMY       OB History   None      Home Medications    Prior to Admission medications   Medication Sig Start Date End Date Taking? Authorizing Provider  aspirin 81 MG tablet Take 81 mg by mouth daily.    Yes [provider]  atorvastatin (LIPITOR) 10 MG tablet Take 10 mg by mouth daily.   Yes [provider]  carvedilol (COREG) 25 MG tablet Take 25 mg by mouth 2 (two) times daily with a meal.   Yes [provider]  cloNIDine (CATAPRES) 0.2 MG tablet Take 0.2 mg by mouth 3 (three) times daily.   Yes [provider]  amoxicillin (AMOXIL) 500 MG capsule Take 2 capsules (1,000 mg total) by mouth 2 (two) times daily. 04/23/18   Orpah Greek, MD  benzonatate (TESSALON) 100 MG capsule Take 1 capsule (100 mg total) by mouth 3 (three) times daily as needed for cough. 04/23/18   Orpah Greek, MD  fluticasone (FLONASE) 50 MCG/ACT nasal spray Place 1 spray into both nostrils daily. 04/23/18   Orpah Greek, MD  predniSONE (DELTASONE) 20 MG tablet Take 2 tablets (40 mg total) by mouth  daily with breakfast. 04/23/18   Reagann Dolce, Gwenyth Allegra, MD    Family History No family history on file.  Social History Social History   Tobacco Use  . Smoking status: Never Smoker  . Smokeless tobacco: Never Used  Substance Use Topics  . Alcohol use: No  . Drug use: No     Allergies   Patient has no known allergies.   Review of Systems Review of Systems  HENT: Positive for congestion and sore throat.   Respiratory: Positive for cough.      Physical Exam Updated Vital Signs BP (!) 187/96 (BP Location: Right Arm)   Pulse (!) 56   Temp 97.8 F (36.6 C) (Oral)   Resp 18   Ht 5\' 6"  (1.676 m)   Wt 72.6 kg   SpO2 99%   BMI 25.82 kg/m   Physical Exam  Constitutional: She is oriented to person, place, and time. She appears well-developed and well-nourished. No distress.  HENT:  Head: Normocephalic and atraumatic.  Right Ear: Hearing normal.  Left Ear: Hearing normal.  Nose: Mucosal edema present. Right sinus exhibits maxillary sinus tenderness and frontal sinus tenderness. Left sinus exhibits maxillary sinus tenderness and frontal sinus tenderness.  Mouth/Throat: Oropharynx is clear and moist and mucous membranes are normal.  Eyes: Pupils are equal, round, and reactive to light.  Conjunctivae and EOM are normal.  Neck: Normal range of motion. Neck supple.  Cardiovascular: Regular rhythm, S1 normal and S2 normal. Exam reveals no gallop and no friction rub.  No murmur heard. Pulmonary/Chest: Effort normal and breath sounds normal. No respiratory distress. She exhibits no tenderness.  Abdominal: Soft. Normal appearance and bowel sounds are normal. There is no hepatosplenomegaly. There is no tenderness. There is no rebound, no guarding, no tenderness at McBurney's point and negative Murphy's sign. No hernia.  Musculoskeletal: Normal range of motion.  Neurological: She is alert and oriented to person, place, and time. She has normal strength. No cranial nerve deficit or  sensory deficit. Coordination normal. GCS eye subscore is 4. GCS verbal subscore is 5. GCS motor subscore is 6.  Skin: Skin is warm, dry and intact. No rash noted. No cyanosis.  Psychiatric: She has a normal mood and affect. Her speech is normal and behavior is normal. Thought content normal.  Nursing note and vitals reviewed.    ED Treatments / Results  Labs (all labs ordered are listed, but only abnormal results are displayed) Labs Reviewed - No data to display  EKG EKG Interpretation  Date/Time:  Sunday April 22 2018 22:47:35 EDT Ventricular Rate:  61 PR Interval:  216 QRS Duration: 86 QT Interval:  434 QTC Calculation: 436 R Axis:   70 Text Interpretation:  Sinus rhythm with 1st degree A-V block Moderate voltage criteria for LVH, may be normal variant Borderline ECG Confirmed by ,  J ([id number]) on 04/23/2018 4:30:32 AM   Radiology Dg Chest 2 View  Result Date: 04/23/2018 CLINICAL DATA:  67 y/o  F; congestion and cold. EXAM: CHEST - 2 VIEW COMPARISON:  09/07/2013 chest radiograph FINDINGS: Stable normal cardiac silhouette given projection and technique. Aortic atherosclerosis with calcification. Clear lungs. No pleural effusion or pneumothorax. Mild S-shaped curvature of the spine. No acute osseous abnormality identified. IMPRESSION: No acute pulmonary process identified. Electronically Signed   By: Lance  Furusawa-Stratton M.D.   On: 04/23/2018 05:48    Procedures Procedures (including critical care time)  Medications Ordered in ED Medications - No data to display   Initial Impression / Assessment and Plan / ED Course  I have reviewed the triage vital signs and the nursing notes.  Pertinent labs & imaging results that were available during my care of the patient were reviewed by me and considered in my medical decision making (see chart for details).     Patient presents to the emergency department for evaluation of nasal congestion, headache, cough.   Symptoms ongoing for a week.  She has been taking Benadryl and Tylenol without relief.  Patient noted to be hypertensive here in the ER.  She is not taking any OTC decongestants.  She did miss nighttime doses of her hypertension medications while she was in the waiting room.  Blood pressure has come down somewhat here in the ER, does not require emergent treatment.  She can take her meds when she gets home.  Chest x-ray was performed read no evidence of pneumonia or other significant abnormality.  Final Clinical Impressions(s) / ED Diagnoses   Final diagnoses:  Bronchitis    ED Discharge Orders         Ordered    fluticasone (FLONASE) 50 MCG/ACT nasal spray  Daily     04/23/18 0616    predniSONE (DELTASONE) 20 MG tablet  Daily with breakfast     09 /23/19 0616    amoxicillin (AMOXIL) 500 MG capsule  2  times daily     04/23/18 0616    benzonatate (TESSALON) 100 MG capsule  3 times daily PRN     04/23/18 0616           Orpah Greek, MD 04/23/18 [phone]

## 2018-04-23 NOTE — ED Notes (Signed)
ED Provider at bedside. 

## 2018-04-23 NOTE — ED Notes (Signed)
Patient transported to X-ray 

## 2018-06-18 DIAGNOSIS — E785 Hyperlipidemia, unspecified: Secondary | ICD-10-CM | POA: Diagnosis not present

## 2018-06-18 DIAGNOSIS — N189 Chronic kidney disease, unspecified: Secondary | ICD-10-CM | POA: Diagnosis not present

## 2018-06-18 DIAGNOSIS — I509 Heart failure, unspecified: Secondary | ICD-10-CM | POA: Diagnosis not present

## 2018-06-18 DIAGNOSIS — I10 Essential (primary) hypertension: Secondary | ICD-10-CM | POA: Diagnosis not present

## 2018-06-18 DIAGNOSIS — M1990 Unspecified osteoarthritis, unspecified site: Secondary | ICD-10-CM | POA: Diagnosis not present

## 2018-07-30 DIAGNOSIS — M130 Polyarthritis, unspecified: Secondary | ICD-10-CM | POA: Diagnosis not present

## 2018-07-30 DIAGNOSIS — N189 Chronic kidney disease, unspecified: Secondary | ICD-10-CM | POA: Diagnosis not present

## 2018-07-30 DIAGNOSIS — I10 Essential (primary) hypertension: Secondary | ICD-10-CM | POA: Diagnosis not present

## 2018-07-30 DIAGNOSIS — R600 Localized edema: Secondary | ICD-10-CM | POA: Diagnosis not present

## 2018-08-06 ENCOUNTER — Other Ambulatory Visit: Payer: Self-pay | Admitting: Family Medicine

## 2018-08-06 DIAGNOSIS — R600 Localized edema: Secondary | ICD-10-CM

## 2018-08-06 DIAGNOSIS — M130 Polyarthritis, unspecified: Secondary | ICD-10-CM

## 2018-08-13 ENCOUNTER — Other Ambulatory Visit: Payer: Self-pay | Admitting: Family Medicine

## 2018-08-13 ENCOUNTER — Ambulatory Visit
Admission: RE | Admit: 2018-08-13 | Discharge: 2018-08-13 | Disposition: A | Discharge status: Home / Self Care | Payer: Medicare Other | Source: Ambulatory Visit | Attending: Family Medicine | Admitting: Family Medicine

## 2018-08-13 DIAGNOSIS — M79661 Pain in right lower leg: Secondary | ICD-10-CM | POA: Diagnosis not present

## 2018-08-13 DIAGNOSIS — M7989 Other specified soft tissue disorders: Secondary | ICD-10-CM | POA: Diagnosis not present

## 2018-08-13 DIAGNOSIS — R52 Pain, unspecified: Secondary | ICD-10-CM

## 2018-08-27 DIAGNOSIS — I872 Venous insufficiency (chronic) (peripheral): Secondary | ICD-10-CM | POA: Diagnosis not present

## 2018-08-27 DIAGNOSIS — E782 Mixed hyperlipidemia: Secondary | ICD-10-CM | POA: Diagnosis not present

## 2018-08-27 DIAGNOSIS — Z0000 Encounter for general adult medical examination without abnormal findings: Secondary | ICD-10-CM | POA: Diagnosis not present

## 2018-08-27 DIAGNOSIS — M130 Polyarthritis, unspecified: Secondary | ICD-10-CM | POA: Diagnosis not present

## 2018-08-27 DIAGNOSIS — R600 Localized edema: Secondary | ICD-10-CM | POA: Diagnosis not present

## 2018-11-30 ENCOUNTER — Other Ambulatory Visit: Payer: Self-pay | Admitting: Cardiology

## 2018-11-30 DIAGNOSIS — Z1231 Encounter for screening mammogram for malignant neoplasm of breast: Secondary | ICD-10-CM

## 2019-01-21 DIAGNOSIS — I10 Essential (primary) hypertension: Secondary | ICD-10-CM | POA: Diagnosis not present

## 2019-01-21 DIAGNOSIS — R1310 Dysphagia, unspecified: Secondary | ICD-10-CM | POA: Diagnosis not present

## 2019-01-21 DIAGNOSIS — I509 Heart failure, unspecified: Secondary | ICD-10-CM | POA: Diagnosis not present

## 2019-01-21 DIAGNOSIS — E785 Hyperlipidemia, unspecified: Secondary | ICD-10-CM | POA: Diagnosis not present

## 2019-01-21 DIAGNOSIS — N189 Chronic kidney disease, unspecified: Secondary | ICD-10-CM | POA: Diagnosis not present

## 2019-02-04 ENCOUNTER — Ambulatory Visit: Payer: Medicare Other

## 2019-02-11 DIAGNOSIS — E782 Mixed hyperlipidemia: Secondary | ICD-10-CM | POA: Diagnosis not present

## 2019-02-11 DIAGNOSIS — I10 Essential (primary) hypertension: Secondary | ICD-10-CM | POA: Diagnosis not present

## 2019-02-11 DIAGNOSIS — R739 Hyperglycemia, unspecified: Secondary | ICD-10-CM | POA: Diagnosis not present

## 2019-02-11 DIAGNOSIS — N189 Chronic kidney disease, unspecified: Secondary | ICD-10-CM | POA: Diagnosis not present

## 2019-02-11 DIAGNOSIS — R7309 Other abnormal glucose: Secondary | ICD-10-CM | POA: Diagnosis not present

## 2019-02-11 DIAGNOSIS — R239 Unspecified skin changes: Secondary | ICD-10-CM | POA: Diagnosis not present

## 2019-04-15 DIAGNOSIS — E785 Hyperlipidemia, unspecified: Secondary | ICD-10-CM | POA: Diagnosis not present

## 2019-04-15 DIAGNOSIS — I10 Essential (primary) hypertension: Secondary | ICD-10-CM | POA: Diagnosis not present

## 2019-04-22 DIAGNOSIS — N189 Chronic kidney disease, unspecified: Secondary | ICD-10-CM | POA: Diagnosis not present

## 2019-04-22 DIAGNOSIS — I509 Heart failure, unspecified: Secondary | ICD-10-CM | POA: Diagnosis not present

## 2019-04-22 DIAGNOSIS — I10 Essential (primary) hypertension: Secondary | ICD-10-CM | POA: Diagnosis not present

## 2019-04-22 DIAGNOSIS — E785 Hyperlipidemia, unspecified: Secondary | ICD-10-CM | POA: Diagnosis not present

## 2019-04-22 DIAGNOSIS — M1990 Unspecified osteoarthritis, unspecified site: Secondary | ICD-10-CM | POA: Diagnosis not present

## 2019-06-10 DIAGNOSIS — I10 Essential (primary) hypertension: Secondary | ICD-10-CM | POA: Diagnosis not present

## 2019-06-10 DIAGNOSIS — Z23 Encounter for immunization: Secondary | ICD-10-CM | POA: Diagnosis not present

## 2019-06-10 DIAGNOSIS — E782 Mixed hyperlipidemia: Secondary | ICD-10-CM | POA: Diagnosis not present

## 2019-06-10 DIAGNOSIS — M130 Polyarthritis, unspecified: Secondary | ICD-10-CM | POA: Diagnosis not present

## 2019-07-22 DIAGNOSIS — E785 Hyperlipidemia, unspecified: Secondary | ICD-10-CM | POA: Diagnosis not present

## 2019-07-22 DIAGNOSIS — I509 Heart failure, unspecified: Secondary | ICD-10-CM | POA: Diagnosis not present

## 2019-07-22 DIAGNOSIS — N189 Chronic kidney disease, unspecified: Secondary | ICD-10-CM | POA: Diagnosis not present

## 2019-07-22 DIAGNOSIS — I10 Essential (primary) hypertension: Secondary | ICD-10-CM | POA: Diagnosis not present

## 2019-07-22 DIAGNOSIS — M1990 Unspecified osteoarthritis, unspecified site: Secondary | ICD-10-CM | POA: Diagnosis not present

## 2019-10-28 DIAGNOSIS — E785 Hyperlipidemia, unspecified: Secondary | ICD-10-CM | POA: Diagnosis not present

## 2019-10-28 DIAGNOSIS — N189 Chronic kidney disease, unspecified: Secondary | ICD-10-CM | POA: Diagnosis not present

## 2019-10-28 DIAGNOSIS — I509 Heart failure, unspecified: Secondary | ICD-10-CM | POA: Diagnosis not present

## 2019-10-28 DIAGNOSIS — I10 Essential (primary) hypertension: Secondary | ICD-10-CM | POA: Diagnosis not present

## 2019-10-28 DIAGNOSIS — M1990 Unspecified osteoarthritis, unspecified site: Secondary | ICD-10-CM | POA: Diagnosis not present

## 2019-11-18 DIAGNOSIS — R7309 Other abnormal glucose: Secondary | ICD-10-CM | POA: Diagnosis not present

## 2019-11-18 DIAGNOSIS — I10 Essential (primary) hypertension: Secondary | ICD-10-CM | POA: Diagnosis not present

## 2019-11-18 DIAGNOSIS — R739 Hyperglycemia, unspecified: Secondary | ICD-10-CM | POA: Diagnosis not present

## 2019-11-18 DIAGNOSIS — M130 Polyarthritis, unspecified: Secondary | ICD-10-CM | POA: Diagnosis not present

## 2019-11-18 DIAGNOSIS — N189 Chronic kidney disease, unspecified: Secondary | ICD-10-CM | POA: Diagnosis not present

## 2019-11-27 ENCOUNTER — Other Ambulatory Visit: Payer: Self-pay | Admitting: Cardiology

## 2019-11-27 DIAGNOSIS — Z1231 Encounter for screening mammogram for malignant neoplasm of breast: Secondary | ICD-10-CM

## 2019-12-19 DIAGNOSIS — I10 Essential (primary) hypertension: Secondary | ICD-10-CM | POA: Diagnosis not present

## 2020-01-04 ENCOUNTER — Ambulatory Visit: Payer: Medicare Other | Attending: Internal Medicine

## 2020-02-07 DIAGNOSIS — N189 Chronic kidney disease, unspecified: Secondary | ICD-10-CM | POA: Diagnosis not present

## 2020-02-07 DIAGNOSIS — E785 Hyperlipidemia, unspecified: Secondary | ICD-10-CM | POA: Diagnosis not present

## 2020-02-07 DIAGNOSIS — I509 Heart failure, unspecified: Secondary | ICD-10-CM | POA: Diagnosis not present

## 2020-02-07 DIAGNOSIS — M1990 Unspecified osteoarthritis, unspecified site: Secondary | ICD-10-CM | POA: Diagnosis not present

## 2020-02-07 DIAGNOSIS — I10 Essential (primary) hypertension: Secondary | ICD-10-CM | POA: Diagnosis not present

## 2020-02-10 ENCOUNTER — Inpatient Hospital Stay: Admission: RE | Admit: 2020-02-10 | Payer: Medicare Other | Source: Ambulatory Visit

## 2020-03-10 ENCOUNTER — Other Ambulatory Visit: Payer: Self-pay

## 2020-03-10 ENCOUNTER — Ambulatory Visit
Admission: RE | Admit: 2020-03-10 | Discharge: 2020-03-10 | Disposition: A | Discharge status: Home / Self Care | Payer: Medicare Other | Source: Ambulatory Visit | Attending: Cardiology | Admitting: Cardiology

## 2020-03-10 DIAGNOSIS — Z1231 Encounter for screening mammogram for malignant neoplasm of breast: Secondary | ICD-10-CM | POA: Diagnosis not present

## 2020-03-19 DIAGNOSIS — I10 Essential (primary) hypertension: Secondary | ICD-10-CM | POA: Diagnosis not present

## 2020-03-19 DIAGNOSIS — Z0000 Encounter for general adult medical examination without abnormal findings: Secondary | ICD-10-CM | POA: Diagnosis not present

## 2020-03-19 DIAGNOSIS — N189 Chronic kidney disease, unspecified: Secondary | ICD-10-CM | POA: Diagnosis not present

## 2020-03-19 DIAGNOSIS — E782 Mixed hyperlipidemia: Secondary | ICD-10-CM | POA: Diagnosis not present

## 2020-06-12 DIAGNOSIS — I509 Heart failure, unspecified: Secondary | ICD-10-CM | POA: Diagnosis not present

## 2020-06-12 DIAGNOSIS — N189 Chronic kidney disease, unspecified: Secondary | ICD-10-CM | POA: Diagnosis not present

## 2020-06-12 DIAGNOSIS — I10 Essential (primary) hypertension: Secondary | ICD-10-CM | POA: Diagnosis not present

## 2020-06-12 DIAGNOSIS — E785 Hyperlipidemia, unspecified: Secondary | ICD-10-CM | POA: Diagnosis not present

## 2020-08-29 DIAGNOSIS — I10 Essential (primary) hypertension: Secondary | ICD-10-CM | POA: Diagnosis not present

## 2020-08-29 DIAGNOSIS — E782 Mixed hyperlipidemia: Secondary | ICD-10-CM | POA: Diagnosis not present

## 2020-09-18 DIAGNOSIS — I10 Essential (primary) hypertension: Secondary | ICD-10-CM | POA: Diagnosis not present

## 2020-09-18 DIAGNOSIS — E785 Hyperlipidemia, unspecified: Secondary | ICD-10-CM | POA: Diagnosis not present

## 2020-09-25 DIAGNOSIS — I10 Essential (primary) hypertension: Secondary | ICD-10-CM | POA: Diagnosis not present

## 2020-09-25 DIAGNOSIS — E785 Hyperlipidemia, unspecified: Secondary | ICD-10-CM | POA: Diagnosis not present

## 2020-09-25 DIAGNOSIS — M1990 Unspecified osteoarthritis, unspecified site: Secondary | ICD-10-CM | POA: Diagnosis not present

## 2020-09-25 DIAGNOSIS — I509 Heart failure, unspecified: Secondary | ICD-10-CM | POA: Diagnosis not present

## 2020-11-02 DIAGNOSIS — I509 Heart failure, unspecified: Secondary | ICD-10-CM | POA: Diagnosis not present

## 2020-11-02 DIAGNOSIS — N189 Chronic kidney disease, unspecified: Secondary | ICD-10-CM | POA: Diagnosis not present

## 2020-11-02 DIAGNOSIS — E785 Hyperlipidemia, unspecified: Secondary | ICD-10-CM | POA: Diagnosis not present

## 2020-11-02 DIAGNOSIS — I10 Essential (primary) hypertension: Secondary | ICD-10-CM | POA: Diagnosis not present

## 2020-11-02 DIAGNOSIS — M1990 Unspecified osteoarthritis, unspecified site: Secondary | ICD-10-CM | POA: Diagnosis not present

## 2020-11-09 ENCOUNTER — Other Ambulatory Visit: Payer: Self-pay | Admitting: Family Medicine

## 2020-11-09 ENCOUNTER — Other Ambulatory Visit (HOSPITAL_COMMUNITY)
Admission: RE | Admit: 2020-11-09 | Discharge: 2020-11-09 | Disposition: A | Discharge status: Home / Self Care | Payer: Medicare Other | Source: Ambulatory Visit | Attending: Family Medicine | Admitting: Family Medicine

## 2020-11-09 DIAGNOSIS — Z124 Encounter for screening for malignant neoplasm of cervix: Secondary | ICD-10-CM | POA: Diagnosis present

## 2020-11-09 DIAGNOSIS — E782 Mixed hyperlipidemia: Secondary | ICD-10-CM | POA: Diagnosis not present

## 2020-11-09 DIAGNOSIS — Z1151 Encounter for screening for human papillomavirus (HPV): Secondary | ICD-10-CM | POA: Insufficient documentation

## 2020-11-09 DIAGNOSIS — R239 Unspecified skin changes: Secondary | ICD-10-CM | POA: Diagnosis not present

## 2020-11-09 DIAGNOSIS — N189 Chronic kidney disease, unspecified: Secondary | ICD-10-CM | POA: Diagnosis not present

## 2020-11-09 DIAGNOSIS — R7303 Prediabetes: Secondary | ICD-10-CM | POA: Diagnosis not present

## 2020-11-09 DIAGNOSIS — R1030 Lower abdominal pain, unspecified: Secondary | ICD-10-CM | POA: Diagnosis not present

## 2020-11-09 DIAGNOSIS — I10 Essential (primary) hypertension: Secondary | ICD-10-CM | POA: Diagnosis not present

## 2020-11-09 DIAGNOSIS — R7309 Other abnormal glucose: Secondary | ICD-10-CM | POA: Diagnosis not present

## 2020-11-10 ENCOUNTER — Other Ambulatory Visit: Payer: Self-pay | Admitting: Family Medicine

## 2020-11-10 DIAGNOSIS — R1030 Lower abdominal pain, unspecified: Secondary | ICD-10-CM

## 2020-11-16 LAB — CYTOLOGY - PAP
Chlamydia: NEGATIVE
Comment: NEGATIVE
Comment: NEGATIVE
Comment: NEGATIVE
Comment: NORMAL
Diagnosis: NEGATIVE
Diagnosis: REACTIVE
High risk HPV: NEGATIVE
Neisseria Gonorrhea: NEGATIVE
Trichomonas: NEGATIVE

## 2020-11-24 ENCOUNTER — Other Ambulatory Visit: Payer: Self-pay

## 2020-11-24 ENCOUNTER — Ambulatory Visit
Admission: RE | Admit: 2020-11-24 | Discharge: 2020-11-24 | Disposition: A | Discharge status: Home / Self Care | Payer: Medicare Other | Source: Ambulatory Visit | Attending: Family Medicine | Admitting: Family Medicine

## 2020-11-24 DIAGNOSIS — R1030 Lower abdominal pain, unspecified: Secondary | ICD-10-CM | POA: Diagnosis not present

## 2021-01-01 DIAGNOSIS — I10 Essential (primary) hypertension: Secondary | ICD-10-CM | POA: Diagnosis not present

## 2021-01-01 DIAGNOSIS — N189 Chronic kidney disease, unspecified: Secondary | ICD-10-CM | POA: Diagnosis not present

## 2021-01-01 DIAGNOSIS — I509 Heart failure, unspecified: Secondary | ICD-10-CM | POA: Diagnosis not present

## 2021-01-01 DIAGNOSIS — E785 Hyperlipidemia, unspecified: Secondary | ICD-10-CM | POA: Diagnosis not present

## 2021-01-12 ENCOUNTER — Encounter (HOSPITAL_COMMUNITY): Payer: Self-pay | Admitting: Emergency Medicine

## 2021-01-12 ENCOUNTER — Emergency Department (HOSPITAL_COMMUNITY)
Admission: EM | Admit: 2021-01-12 | Discharge: 2021-01-12 | Disposition: A | Discharge status: Home / Self Care | Payer: Medicare Other | Attending: Emergency Medicine | Admitting: Emergency Medicine

## 2021-01-12 ENCOUNTER — Emergency Department (HOSPITAL_COMMUNITY): Payer: Medicare Other

## 2021-01-12 ENCOUNTER — Other Ambulatory Visit: Payer: Self-pay

## 2021-01-12 DIAGNOSIS — R200 Anesthesia of skin: Secondary | ICD-10-CM | POA: Insufficient documentation

## 2021-01-12 DIAGNOSIS — N3090 Cystitis, unspecified without hematuria: Secondary | ICD-10-CM

## 2021-01-12 DIAGNOSIS — K449 Diaphragmatic hernia without obstruction or gangrene: Secondary | ICD-10-CM | POA: Diagnosis not present

## 2021-01-12 DIAGNOSIS — Z7982 Long term (current) use of aspirin: Secondary | ICD-10-CM | POA: Diagnosis not present

## 2021-01-12 DIAGNOSIS — I10 Essential (primary) hypertension: Secondary | ICD-10-CM | POA: Diagnosis not present

## 2021-01-12 DIAGNOSIS — Z79899 Other long term (current) drug therapy: Secondary | ICD-10-CM | POA: Insufficient documentation

## 2021-01-12 DIAGNOSIS — K5730 Diverticulosis of large intestine without perforation or abscess without bleeding: Secondary | ICD-10-CM | POA: Diagnosis not present

## 2021-01-12 DIAGNOSIS — I6782 Cerebral ischemia: Secondary | ICD-10-CM | POA: Diagnosis not present

## 2021-01-12 DIAGNOSIS — K429 Umbilical hernia without obstruction or gangrene: Secondary | ICD-10-CM | POA: Diagnosis not present

## 2021-01-12 DIAGNOSIS — R112 Nausea with vomiting, unspecified: Secondary | ICD-10-CM | POA: Diagnosis not present

## 2021-01-12 DIAGNOSIS — R1030 Lower abdominal pain, unspecified: Secondary | ICD-10-CM | POA: Diagnosis present

## 2021-01-12 DIAGNOSIS — R110 Nausea: Secondary | ICD-10-CM | POA: Insufficient documentation

## 2021-01-12 DIAGNOSIS — I6529 Occlusion and stenosis of unspecified carotid artery: Secondary | ICD-10-CM | POA: Diagnosis not present

## 2021-01-12 DIAGNOSIS — R0789 Other chest pain: Secondary | ICD-10-CM | POA: Diagnosis not present

## 2021-01-12 LAB — CBC WITH DIFFERENTIAL/PLATELET
Abs Immature Granulocytes: 0.02 10*3/uL (ref 0.00–0.07)
Basophils Absolute: 0 10*3/uL (ref 0.0–0.1)
Basophils Relative: 0 %
Eosinophils Absolute: 0.1 10*3/uL (ref 0.0–0.5)
Eosinophils Relative: 1 %
HCT: 40.6 % (ref 36.0–46.0)
Hemoglobin: 12.4 g/dL (ref 12.0–15.0)
Immature Granulocytes: 0 %
Lymphocytes Relative: 25 %
Lymphs Abs: 2 10*3/uL (ref 0.7–4.0)
MCH: 25.5 pg — ABNORMAL LOW (ref 26.0–34.0)
MCHC: 30.5 g/dL (ref 30.0–36.0)
MCV: 83.4 fL (ref 80.0–100.0)
Monocytes Absolute: 0.4 10*3/uL (ref 0.1–1.0)
Monocytes Relative: 5 %
Neutro Abs: 5.4 10*3/uL (ref 1.7–7.7)
Neutrophils Relative %: 69 %
Platelets: 371 10*3/uL (ref 150–400)
RBC: 4.87 MIL/uL (ref 3.87–5.11)
RDW: 14.5 % (ref 11.5–15.5)
WBC: 7.9 10*3/uL (ref 4.0–10.5)
nRBC: 0 % (ref 0.0–0.2)

## 2021-01-12 LAB — BASIC METABOLIC PANEL
Anion gap: 14 (ref 5–15)
BUN: 23 mg/dL (ref 8–23)
CO2: 17 mmol/L — ABNORMAL LOW (ref 22–32)
Calcium: 10.1 mg/dL (ref 8.9–10.3)
Chloride: 108 mmol/L (ref 98–111)
Creatinine, Ser: 1.88 mg/dL — ABNORMAL HIGH (ref 0.44–1.00)
GFR, Estimated: 28 mL/min — ABNORMAL LOW (ref >60)
Glucose, Bld: 150 mg/dL — ABNORMAL HIGH (ref 70–99)
Potassium: 4.6 mmol/L (ref 3.5–5.1)
Sodium: 139 mmol/L (ref 135–145)

## 2021-01-12 LAB — PROTIME-INR
INR: 1 (ref 0.8–1.2)
Prothrombin Time: 13.4 seconds (ref 11.4–15.2)

## 2021-01-12 LAB — URINALYSIS, ROUTINE W REFLEX MICROSCOPIC
Bilirubin Urine: NEGATIVE
Glucose, UA: NEGATIVE mg/dL
Ketones, ur: 5 mg/dL — AB
Nitrite: NEGATIVE
Protein, ur: >=300 mg/dL — AB
Specific Gravity, Urine: 1.011 (ref 1.005–1.030)
WBC, UA: >50 WBC/hpf — ABNORMAL HIGH (ref 0–5)
pH: 5 (ref 5.0–8.0)

## 2021-01-12 LAB — HEPATIC FUNCTION PANEL
ALT: 13 U/L (ref 0–44)
AST: 21 U/L (ref 15–41)
Albumin: 4.6 g/dL (ref 3.5–5.0)
Alkaline Phosphatase: 116 U/L (ref 38–126)
Bilirubin, Direct: 0.1 mg/dL (ref 0.0–0.2)
Indirect Bilirubin: 0.6 mg/dL (ref 0.3–0.9)
Total Bilirubin: 0.7 mg/dL (ref 0.3–1.2)
Total Protein: 9.1 g/dL — ABNORMAL HIGH (ref 6.5–8.1)

## 2021-01-12 LAB — CBG MONITORING, ED: Glucose-Capillary: 131 mg/dL — ABNORMAL HIGH (ref 70–99)

## 2021-01-12 LAB — TROPONIN I (HIGH SENSITIVITY): Troponin I (High Sensitivity): 11 ng/L (ref <18)

## 2021-01-12 MED ORDER — CLONIDINE HCL 0.1 MG PO TABS
0.2000 mg | ORAL_TABLET | Freq: Once | ORAL | Status: AC
  Administered 2021-01-12: 0.2 mg via ORAL
  Filled 2021-01-12: qty 2

## 2021-01-12 MED ORDER — SODIUM CHLORIDE 0.9 % IV SOLN
6.2500 mg | Freq: Once | INTRAVENOUS | Status: DC
  Filled 2021-01-12 (×4): qty 0.25

## 2021-01-12 MED ORDER — METOCLOPRAMIDE HCL 5 MG/ML IJ SOLN
10.0000 mg | Freq: Once | INTRAMUSCULAR | Status: AC
  Administered 2021-01-12: 10 mg via INTRAVENOUS
  Filled 2021-01-12: qty 2

## 2021-01-12 MED ORDER — ONDANSETRON HCL 4 MG/2ML IJ SOLN
4.0000 mg | Freq: Once | INTRAMUSCULAR | Status: AC
  Administered 2021-01-12: 4 mg via INTRAVENOUS
  Filled 2021-01-12: qty 2

## 2021-01-12 MED ORDER — DICYCLOMINE HCL 20 MG PO TABS: 20.0000 mg | ORAL_TABLET | Freq: Two times a day (BID) | ORAL | 0 refills | Status: DC | PRN

## 2021-01-12 MED ORDER — CARVEDILOL 12.5 MG PO TABS
25.0000 mg | ORAL_TABLET | Freq: Two times a day (BID) | ORAL | Status: DC
  Administered 2021-01-12: 25 mg via ORAL
  Filled 2021-01-12: qty 2

## 2021-01-12 MED ORDER — ONDANSETRON 4 MG PO TBDP: 4.0000 mg | ORAL_TABLET | Freq: Three times a day (TID) | ORAL | 0 refills | Status: DC | PRN

## 2021-01-12 MED ORDER — CEPHALEXIN 500 MG PO CAPS: 500.0000 mg | ORAL_CAPSULE | Freq: Two times a day (BID) | ORAL | 0 refills | Status: AC

## 2021-01-12 MED ORDER — HYDRALAZINE HCL 20 MG/ML IJ SOLN
10.0000 mg | Freq: Once | INTRAMUSCULAR | Status: AC
  Administered 2021-01-12: 10 mg via INTRAVENOUS
  Filled 2021-01-12: qty 1

## 2021-01-12 MED ORDER — SODIUM CHLORIDE 0.9 % IV SOLN
1.0000 g | Freq: Once | INTRAVENOUS | Status: AC
  Administered 2021-01-12: 1 g via INTRAVENOUS
  Filled 2021-01-12: qty 10

## 2021-01-12 NOTE — ED Notes (Signed)
Back from CT

## 2021-01-12 NOTE — Discharge Instructions (Addendum)
You were diagnosed with your infection today.  You were given IV antibiotics in the ER.  You need to fill and continue taking the Keflex antibiotics at home for the next 5 days.  Your next dose is due tomorrow morning.  I also prescribed Bentyl, which is a medication helps with cramping in your abdomen.  Please continue to drink water at home.  Your blood pressure was very high in the ER today.  This was likely related to your pain and you not taking your home medications.  With your nausea getting better, please make an effort to take your pills at home.  It's very important you monitor your blood pressure.  High pressure can lead to stroke.  Check your blood pressure at home every day and keep a diary.    Please call your primary care provider's office today and ask for a follow up appointment this week to see how you're doing.

## 2021-01-12 NOTE — ED Triage Notes (Signed)
Pt arrives POV with son. Pt complains of weakness, numbness on left side, nausea, and hypertension. Pt BP in triage is 210/120.

## 2021-01-12 NOTE — ED Notes (Signed)
To CT via stretcher

## 2021-01-12 NOTE — ED Notes (Signed)
Patient was given apple juice to drink and is tolerating it well.

## 2021-01-12 NOTE — ED Provider Notes (Signed)
[facility] DEPT Provider Note   CSN: [id number] Arrival date & time: 01/12/21  1633     History Chief Complaint  Patient presents with   Hypertension   Numbness    Tamara Friedman is a 70 y.o. female with history of hypertension, high cholesterol, presenting to the emergency department with nausea and lower abdominal pain for 3 days, and left sided paresthesia for 1 day. Patient reports that she received a booster dose of the COVID-vaccine 4 days ago on Friday.  She states that within 24 hours she began to feel nauseated, generally fatigued.  She has had lower abdominal cramping pain and nausea continuously for the past 3 days.  She reports difficulty keeping down food or fluids due to this.  This morning she woke up around 8 AM and felt like her left arm and leg were "numb".  She also feels like there is a discrepancy in the left side of her face.  She denies a headache, blurred vision, imbalance issues.  She denies ever having these symptoms before.  She denies chest pain, chest pressure, shortness of breath, or leg swelling.  She was not able to take any of her medications today including her blood pressure pills.  She denies any prior history of stroke.  She denies any history of smoking.  She is here with her grandson at the bedside.  HPI     Past Medical History:  Diagnosis Date   High cholesterol    Hypertension     There are no problems to display for this patient.   Past Surgical History:  Procedure Laterality Date   PARTIAL HYSTERECTOMY       OB History   No obstetric history on file.     History reviewed. No pertinent family history.  Social History   Tobacco Use   Smoking status: Never   Smokeless tobacco: Never  Substance Use Topics   Alcohol use: No   Drug use: No    Home Medications Prior to Admission medications   Medication Sig Start Date End Date Taking? Authorizing Provider  cephALEXin (KEFLEX) 500 MG capsule  Take 1 capsule (500 mg total) by mouth 2 (two) times daily for 5 days. 01/13/21 01/18/21 Yes Platon Arocho, Carola Rhine, MD  dicyclomine (BENTYL) 20 MG tablet Take 1 tablet (20 mg total) by mouth 2 (two) times daily as needed for up to 20 doses for spasms. Abdominal cramping 01/12/21  Yes Desani Sprung, Carola Rhine, MD  ondansetron (ZOFRAN ODT) 4 MG disintegrating tablet Take 1 tablet (4 mg total) by mouth every 8 (eight) hours as needed for up to 15 doses for nausea or vomiting. 01/12/21  Yes Edu On, Carola Rhine, MD  amoxicillin (AMOXIL) 500 MG capsule Take 2 capsules (1,000 mg total) by mouth 2 (two) times daily. 04/23/18   Orpah Greek, MD  aspirin 81 MG tablet Take 81 mg by mouth daily.     [provider]  atorvastatin (LIPITOR) 10 MG tablet Take 10 mg by mouth daily.    [provider]  benzonatate (TESSALON) 100 MG capsule Take 1 capsule (100 mg total) by mouth 3 (three) times daily as needed for cough. 04/23/18   Orpah Greek, MD  carvedilol (COREG) 25 MG tablet Take 25 mg by mouth 2 (two) times daily with a meal.    [provider]  cloNIDine (CATAPRES) 0.2 MG tablet Take 0.2 mg by mouth 3 (three) times daily.    [provider]  fluticasone (  FLONASE) 50 MCG/ACT nasal spray Place 1 spray into both nostrils daily. 04/23/18   Orpah Greek, MD  predniSONE (DELTASONE) 20 MG tablet Take 2 tablets (40 mg total) by mouth daily with breakfast. 04/23/18   Pollina, Gwenyth Allegra, MD    Allergies    Patient has no known allergies.  Review of Systems   Review of Systems  Constitutional:  Negative for chills and fever.  HENT:  Negative for ear pain and sore throat.   Eyes:  Negative for photophobia and visual disturbance.  Respiratory:  Negative for cough and shortness of breath.   Cardiovascular:  Negative for chest pain and palpitations.  Gastrointestinal:  Positive for abdominal pain, nausea and vomiting. Negative for constipation and diarrhea.   Genitourinary:  Negative for dysuria and hematuria.  Musculoskeletal:  Negative for arthralgias and back pain.  Skin:  Negative for color change and rash.  Neurological:  Positive for numbness. Negative for dizziness, seizures, syncope, speech difficulty, weakness, light-headedness and headaches.  All other systems reviewed and are negative.  Physical Exam Updated Vital Signs BP (!) 150/100   Pulse 89   Temp 98.2 F (36.8 C) (Oral)   Resp 20   SpO2 98%   Physical Exam Constitutional:      General: She is not in acute distress. HENT:     Head: Normocephalic and atraumatic.  Eyes:     Conjunctiva/sclera: Conjunctivae normal.     Pupils: Pupils are equal, round, and reactive to light.  Cardiovascular:     Rate and Rhythm: Normal rate and regular rhythm.     Pulses: Normal pulses.  Pulmonary:     Effort: Pulmonary effort is normal. No respiratory distress.  Abdominal:     General: There is no distension.     Tenderness: There is no abdominal tenderness. There is no guarding or rebound. Negative signs include Murphy's sign and McBurney's sign.  Skin:    General: Skin is warm and dry.  Neurological:     General: No focal deficit present.     Mental Status: She is alert and oriented to person, place, and time. Mental status is at baseline.     Cranial Nerves: No cranial nerve deficit.     Sensory: No sensory deficit.     Motor: No weakness.     Gait: Gait normal.  Psychiatric:        Mood and Affect: Mood normal.        Behavior: Behavior normal.    ED Results / Procedures / Treatments   Labs (all labs ordered are listed, but only abnormal results are displayed) Labs Reviewed  BASIC METABOLIC PANEL - Abnormal; Notable for the following components:      Result Value   CO2 17 (*)    Glucose, Bld 150 (*)    Creatinine, Ser 1.88 (*)    GFR, Estimated 28 (*)    All other components within normal limits  CBC WITH DIFFERENTIAL/PLATELET - Abnormal; Notable for the following  components:   MCH 25.5 (*)    All other components within normal limits  HEPATIC FUNCTION PANEL - Abnormal; Notable for the following components:   Total Protein 9.1 (*)    All other components within normal limits  URINALYSIS, ROUTINE W REFLEX MICROSCOPIC - Abnormal; Notable for the following components:   APPearance CLOUDY (*)    Hgb urine dipstick SMALL (*)    Ketones, ur 5 (*)    Protein, ur >=300 (*)    Leukocytes,Ua LARGE (*)  WBC, UA >50 (*)    Bacteria, UA MANY (*)    All other components within normal limits  CBG MONITORING, ED - Abnormal; Notable for the following components:   Glucose-Capillary 131 (*)    All other components within normal limits  RESP PANEL BY RT-PCR (FLU A&B, COVID) [id number]  URINE CULTURE  PROTIME-INR  TROPONIN I (HIGH SENSITIVITY)    EKG EKG Interpretation  Date/Time:  Tuesday January 12 2021 17:09:37 EDT Ventricular Rate:  70 PR Interval:  183 QRS Duration: 87 QT Interval:  407 QTC Calculation: 440 R Axis:   45 Text Interpretation: Sinus rhythm Left ventricular hypertrophy No STEMI Confirmed by Octaviano Glow [phone]) on 01/12/2021 5:33:30 PM  Radiology CT ABDOMEN PELVIS WO CONTRAST  Result Date: 01/12/2021 CLINICAL DATA:  Lower abdominal pain and nausea for 3 days. Suspected diverticulitis. EXAM: CT ABDOMEN AND PELVIS WITHOUT CONTRAST TECHNIQUE: Multidetector CT imaging of the abdomen and pelvis was performed following the standard protocol without IV contrast. COMPARISON:  07/19/2013 FINDINGS: Lower chest: Mild atelectasis in the lung bases. Large esophageal hiatal hernia. Hepatobiliary: No focal liver abnormality is seen. No gallstones, gallbladder wall thickening, or biliary dilatation. Pancreas: Unremarkable. No pancreatic ductal dilatation or surrounding inflammatory changes. Spleen: Normal in size without focal abnormality. Adrenals/Urinary Tract: No adrenal gland nodules. Exophytic mass suggested off of the midpole left kidney measuring 2.3  cm diameter. This was present on the previous study without interval change. Interval ultrasound from 07/24/13 suggested this to represent a cyst. No hydronephrosis or hydroureter. Small amount of gas in the bladder. This could come from infection or instrumentation. No wall thickening or filling defect. Stomach/Bowel: Stomach, small bowel, and colon are not abnormally distended. Diverticulosis of the sigmoid colon. No inflammatory change to suggest diverticulitis. Appendix is not identified. Vascular/Lymphatic: Aortic atherosclerosis. No enlarged abdominal or pelvic lymph nodes. Reproductive: Status post hysterectomy. No adnexal masses. Other: No free air or free fluid in the abdomen. Small periumbilical hernia containing fat. Musculoskeletal: No acute or significant osseous findings. IMPRESSION: 1. Diverticulosis of the sigmoid colon. No evidence to suggest diverticulitis. 2. Exophytic mass suggested off of the midpole of the left kidney is unchanged since prior study and suggested to represent a cyst on previous ultrasound. 3. Gas in the bladder may represent instrumentation or cystitis. 4. Large esophageal hiatal hernia. Electronically Signed   By: Lucienne Capers M.D.   On: 01/12/2021 20:40   CT Head Wo Contrast  Result Date: 01/12/2021 CLINICAL DATA:  Left arm and leg numbness hypertension EXAM: CT HEAD WITHOUT CONTRAST TECHNIQUE: Contiguous axial images were obtained from the base of the skull through the vertex without intravenous contrast. COMPARISON:  CT 12/24/2007 FINDINGS: Brain: No acute territorial infarction, hemorrhage or intracranial mass. Patchy white matter hypodensity consistent with chronic small vessel ischemic change. The ventricles are nonenlarged. Vascular: No hyperdense vessels.  Carotid vascular calcification Skull: No fracture Sinuses/Orbits: Sinuses are clear. Probable small middle ear implants. Other: None IMPRESSION: 1. No CT evidence for acute intracranial abnormality. 2. Mild  chronic small vessel ischemic change of the white matter Electronically Signed   By: Donavan Foil M.D.   On: 01/12/2021 18:40   DG Chest Portable 1 View  Result Date: 01/12/2021 CLINICAL DATA:  Chest discomfort EXAM: PORTABLE CHEST 1 VIEW COMPARISON:  04/23/2018, CT 07/19/2013 FINDINGS: The heart size and mediastinal contours are within normal limits. Both lungs are clear. The visualized skeletal structures are unremarkable. Retrocardiac opacity likely due to hiatal hernia. IMPRESSION: No active disease.  Hiatal  hernia Electronically Signed   By: Donavan Foil M.D.   On: 01/12/2021 17:34    Procedures .Critical Care  Date/Time: 01/13/2021 9:26 AM Performed by: Wyvonnia Dusky, MD Authorized by: Wyvonnia Dusky, MD   Critical care provider statement:    Critical care time (minutes):  45   Critical care was necessary to treat or prevent imminent or life-threatening deterioration of the following conditions:  Circulatory failure   Critical care was time spent personally by me on the following activities:  Discussions with consultants, evaluation of patient's response to treatment, examination of patient, ordering and performing treatments and interventions, ordering and review of laboratory studies, ordering and review of radiographic studies, pulse oximetry, re-evaluation of patient's condition, obtaining history from patient or surrogate and review of old charts Comments:     Repeat neurological exams, IV blood pressure control for hypertensive urgency   Medications Ordered in ED Medications  ondansetron (ZOFRAN) injection 4 mg (4 mg Intravenous Given 01/12/21 1713)  hydrALAZINE (APRESOLINE) injection 10 mg (10 mg Intravenous Given 01/12/21 1712)  metoCLOPramide (REGLAN) injection 10 mg (10 mg Intravenous Given 01/12/21 1844)  cloNIDine (CATAPRES) tablet 0.2 mg (0.2 mg Oral Given 01/12/21 1903)  hydrALAZINE (APRESOLINE) injection 10 mg (10 mg Intravenous Given 01/12/21 1939)  cefTRIAXone  (ROCEPHIN) 1 g in sodium chloride 0.9 % 100 mL IVPB (0 g Intravenous Stopped 01/12/21 2331)  cloNIDine (CATAPRES) tablet 0.2 mg (0.2 mg Oral Given 01/12/21 2256)    ED Course  I have reviewed the triage vital signs and the nursing notes.  Pertinent labs & imaging results that were available during my care of the patient were reviewed by me and considered in my medical decision making (see chart for details).  70 yo female presenting to ED with persistent nausea after receiving a Covid vaccine, now x 3 days.    Ddx includes UTI vs colitis vs kidney stone vs diverticulitis vs side effect to vaccine vs other  She was hypertensive on arrival - likely related to pain, stress, and not taking home medications (clonidine 0.2 mg and coreg 25 mg BID).  We attempted to give PO meds but she vomited them.  I gave instead two rounds of IV hydralazine, along with nausea and pain medications.  Her BP subsequently improved throughout her stay in the ED with her symptoms improving.  Her labs were reviewed and otherwise showed unremarkable CBC and BMP (levels near baseline).  NO acute anemia, no evidence of infection.  Trop and ECG unremarkable -no sign of ACS.  Doubt PE.  CTH and CT abdomen reviewed -no acute infarct noted on CT imaging of head, and CT abdomen notable only for some gas in bladder.  UA subsequently showed likely sign of UTI.  We gave IV rocephin and I decided to treat her empirically for UTI.  On my repeat neurological assessments she continued to have no objective neurological deficits to suggest LVO or CVA.  After her initial presentation, she reports resolution of her left sided paresthesia.  She had no signs or symptoms of aortic dissection.  We discussed the possibility that this was related to hypertensive urgency, but I did feel with improvement of her BP and symptoms, there was no indication for hospitalization at this time.  Close return precautions were given to the patient.  Her grandson  will pick her up.  Clinical Course as of 01/13/21 0917  Tue Jan 12, 2021  1703 Pt assessed on arrival.  No signs of LVO.  Onset  of symptoms 0800 today - outside window for tPA/stroke intervention.  I have ordered 10 mg hydralazine for hypertensive (BP 230/150) and zofran for nausea. [MT]  1922 Patient vomited immediately after be given her p.o. medications.  I will give her another dose of IV hydralazine.  She just got IV Reglan for nausea.  Also obtain a CT scan of the abdomen if she is having persistent nausea [MT]  1924 She reports her left sided "coldness" has resolved but continues to be uncomfortable due to nausea and cramping in her abdomen. [MT]  2047 IMPRESSION: 1. Diverticulosis of the sigmoid colon. No evidence to suggest diverticulitis. 2. Exophytic mass suggested off of the midpole of the left kidney is unchanged since prior study and suggested to represent a cyst on previous ultrasound. 3. Gas in the bladder may represent instrumentation or cystitis. 4. Large esophageal hiatal hernia. [MT]  2245 UA suggestive of UTI - rocephin ordered, will try PO clonidine again as her nausea has improved [MT]  2322 Patient feels significantly better.  She was able to tolerate p.o. and keep down her medications.  Her blood pressure has come down somewhat, although remains hypertensive.  I do think at this point to be reasonable to discharge her home on antibiotics to treat for UTI.  I explained that she does need to monitor her blood pressure at home and keep down her medications.  However I expect she will feel better with the management of her infection [MT]  2323 She continues to deny any left-sided numbness or weakness.   It does not appear that she was ever truly "numb" on the left side, but that she experienced some kind of paresthesias.  I don't see since of CVA on her CT scan. [MT]    Clinical Course User Index [MT] Brad Mcgaughy, Carola Rhine, MD    Final Clinical Impression(s) / ED  Diagnoses Final diagnoses:  Cystitis  Hypertension, unspecified type  Nausea    Rx / DC Orders ED Discharge Orders          Ordered    cephALEXin (KEFLEX) 500 MG capsule  2 times daily        01/12/21 2332    dicyclomine (BENTYL) 20 MG tablet  2 times daily PRN        01/12/21 2332    ondansetron (ZOFRAN ODT) 4 MG disintegrating tablet  Every 8 hours PRN        01/12/21 2332             Wyvonnia Dusky, MD 01/13/21 [phone]

## 2021-01-12 NOTE — ED Notes (Signed)
Pure wick is placed will give urine sample when able.

## 2021-01-12 NOTE — ED Notes (Signed)
Patient ambulated to the hall bathroom with assistance for urine sample. Patient also stated she was not nauseous anymore, so phenergan was not given.

## 2021-01-12 NOTE — ED Notes (Signed)
To CT

## 2021-01-15 DIAGNOSIS — N399 Disorder of urinary system, unspecified: Secondary | ICD-10-CM | POA: Diagnosis not present

## 2021-01-16 LAB — URINE CULTURE: Culture: >=100000 — AB

## 2021-01-22 DIAGNOSIS — N399 Disorder of urinary system, unspecified: Secondary | ICD-10-CM | POA: Diagnosis not present

## 2021-02-08 DIAGNOSIS — E785 Hyperlipidemia, unspecified: Secondary | ICD-10-CM | POA: Diagnosis not present

## 2021-02-08 DIAGNOSIS — I509 Heart failure, unspecified: Secondary | ICD-10-CM | POA: Diagnosis not present

## 2021-02-08 DIAGNOSIS — N189 Chronic kidney disease, unspecified: Secondary | ICD-10-CM | POA: Diagnosis not present

## 2021-02-08 DIAGNOSIS — I10 Essential (primary) hypertension: Secondary | ICD-10-CM | POA: Diagnosis not present

## 2021-02-10 ENCOUNTER — Other Ambulatory Visit: Payer: Self-pay | Admitting: Cardiology

## 2021-02-10 DIAGNOSIS — Z1231 Encounter for screening mammogram for malignant neoplasm of breast: Secondary | ICD-10-CM

## 2021-02-11 DIAGNOSIS — E7800 Pure hypercholesterolemia, unspecified: Secondary | ICD-10-CM | POA: Diagnosis not present

## 2021-02-11 DIAGNOSIS — I10 Essential (primary) hypertension: Secondary | ICD-10-CM | POA: Diagnosis not present

## 2021-03-23 DIAGNOSIS — H2513 Age-related nuclear cataract, bilateral: Secondary | ICD-10-CM | POA: Diagnosis not present

## 2021-03-23 DIAGNOSIS — H35033 Hypertensive retinopathy, bilateral: Secondary | ICD-10-CM | POA: Diagnosis not present

## 2021-04-07 ENCOUNTER — Ambulatory Visit: Payer: Medicare Other

## 2021-04-27 ENCOUNTER — Observation Stay (HOSPITAL_COMMUNITY)
Admission: EM | Admit: 2021-04-27 | Discharge: 2021-04-29 | Disposition: A | Discharge status: Home / Self Care | Payer: Medicare Other | Attending: Emergency Medicine | Admitting: Emergency Medicine

## 2021-04-27 ENCOUNTER — Encounter (HOSPITAL_COMMUNITY): Payer: Self-pay

## 2021-04-27 ENCOUNTER — Emergency Department (HOSPITAL_COMMUNITY): Payer: Medicare Other

## 2021-04-27 ENCOUNTER — Other Ambulatory Visit: Payer: Self-pay

## 2021-04-27 DIAGNOSIS — N179 Acute kidney failure, unspecified: Secondary | ICD-10-CM | POA: Insufficient documentation

## 2021-04-27 DIAGNOSIS — K449 Diaphragmatic hernia without obstruction or gangrene: Secondary | ICD-10-CM | POA: Diagnosis not present

## 2021-04-27 DIAGNOSIS — K529 Noninfective gastroenteritis and colitis, unspecified: Secondary | ICD-10-CM | POA: Diagnosis not present

## 2021-04-27 DIAGNOSIS — R778 Other specified abnormalities of plasma proteins: Secondary | ICD-10-CM | POA: Insufficient documentation

## 2021-04-27 DIAGNOSIS — R112 Nausea with vomiting, unspecified: Secondary | ICD-10-CM | POA: Diagnosis present

## 2021-04-27 DIAGNOSIS — N189 Chronic kidney disease, unspecified: Secondary | ICD-10-CM | POA: Diagnosis not present

## 2021-04-27 DIAGNOSIS — Z79899 Other long term (current) drug therapy: Secondary | ICD-10-CM | POA: Insufficient documentation

## 2021-04-27 DIAGNOSIS — K439 Ventral hernia without obstruction or gangrene: Secondary | ICD-10-CM | POA: Diagnosis not present

## 2021-04-27 DIAGNOSIS — I10 Essential (primary) hypertension: Secondary | ICD-10-CM

## 2021-04-27 DIAGNOSIS — R410 Disorientation, unspecified: Secondary | ICD-10-CM | POA: Diagnosis not present

## 2021-04-27 DIAGNOSIS — I509 Heart failure, unspecified: Secondary | ICD-10-CM

## 2021-04-27 DIAGNOSIS — Z7982 Long term (current) use of aspirin: Secondary | ICD-10-CM | POA: Diagnosis not present

## 2021-04-27 DIAGNOSIS — I130 Hypertensive heart and chronic kidney disease with heart failure and stage 1 through stage 4 chronic kidney disease, or unspecified chronic kidney disease: Secondary | ICD-10-CM | POA: Insufficient documentation

## 2021-04-27 DIAGNOSIS — A419 Sepsis, unspecified organism: Secondary | ICD-10-CM

## 2021-04-27 DIAGNOSIS — Z20822 Contact with and (suspected) exposure to covid-19: Secondary | ICD-10-CM | POA: Insufficient documentation

## 2021-04-27 DIAGNOSIS — R7989 Other specified abnormal findings of blood chemistry: Secondary | ICD-10-CM

## 2021-04-27 DIAGNOSIS — I160 Hypertensive urgency: Secondary | ICD-10-CM | POA: Insufficient documentation

## 2021-04-27 DIAGNOSIS — N184 Chronic kidney disease, stage 4 (severe): Secondary | ICD-10-CM | POA: Diagnosis not present

## 2021-04-27 DIAGNOSIS — R519 Headache, unspecified: Secondary | ICD-10-CM | POA: Diagnosis not present

## 2021-04-27 DIAGNOSIS — N281 Cyst of kidney, acquired: Secondary | ICD-10-CM | POA: Diagnosis not present

## 2021-04-27 DIAGNOSIS — R6510 Systemic inflammatory response syndrome (SIRS) of non-infectious origin without acute organ dysfunction: Secondary | ICD-10-CM | POA: Insufficient documentation

## 2021-04-27 DIAGNOSIS — R000 Tachycardia, unspecified: Secondary | ICD-10-CM | POA: Diagnosis not present

## 2021-04-27 DIAGNOSIS — I161 Hypertensive emergency: Secondary | ICD-10-CM

## 2021-04-27 DIAGNOSIS — K429 Umbilical hernia without obstruction or gangrene: Secondary | ICD-10-CM | POA: Diagnosis not present

## 2021-04-27 LAB — LACTIC ACID, PLASMA
Lactic Acid, Venous: 2.2 mmol/L (ref 0.5–1.9)
Lactic Acid, Venous: 2.8 mmol/L (ref 0.5–1.9)
Lactic Acid, Venous: 1.6 mmol/L (ref 0.5–1.9)

## 2021-04-27 LAB — URINALYSIS, ROUTINE W REFLEX MICROSCOPIC
Bilirubin Urine: NEGATIVE
Glucose, UA: NEGATIVE mg/dL
Ketones, ur: NEGATIVE mg/dL
Leukocytes,Ua: NEGATIVE
Nitrite: NEGATIVE
Protein, ur: 100 mg/dL — AB
Specific Gravity, Urine: 1.025 (ref 1.005–1.030)
WBC, UA: >50 WBC/hpf — ABNORMAL HIGH (ref 0–5)
pH: 6 (ref 5.0–8.0)

## 2021-04-27 LAB — COMPREHENSIVE METABOLIC PANEL
ALT: 17 U/L (ref 0–44)
AST: 28 U/L (ref 15–41)
Albumin: 4.5 g/dL (ref 3.5–5.0)
Alkaline Phosphatase: 117 U/L (ref 38–126)
Anion gap: 16 — ABNORMAL HIGH (ref 5–15)
BUN: 27 mg/dL — ABNORMAL HIGH (ref 8–23)
CO2: 22 mmol/L (ref 22–32)
Calcium: 10.2 mg/dL (ref 8.9–10.3)
Chloride: 105 mmol/L (ref 98–111)
Creatinine, Ser: 2.18 mg/dL — ABNORMAL HIGH (ref 0.44–1.00)
GFR, Estimated: 23 mL/min — ABNORMAL LOW (ref >60)
Glucose, Bld: 141 mg/dL — ABNORMAL HIGH (ref 70–99)
Potassium: 4.7 mmol/L (ref 3.5–5.1)
Sodium: 143 mmol/L (ref 135–145)
Total Bilirubin: 0.8 mg/dL (ref 0.3–1.2)
Total Protein: 9.5 g/dL — ABNORMAL HIGH (ref 6.5–8.1)

## 2021-04-27 LAB — CBC
HCT: 44.7 % (ref 36.0–46.0)
Hemoglobin: 13.5 g/dL (ref 12.0–15.0)
MCH: 25 pg — ABNORMAL LOW (ref 26.0–34.0)
MCHC: 30.2 g/dL (ref 30.0–36.0)
MCV: 82.6 fL (ref 80.0–100.0)
Platelets: 422 10*3/uL — ABNORMAL HIGH (ref 150–400)
RBC: 5.41 MIL/uL — ABNORMAL HIGH (ref 3.87–5.11)
RDW: 15.3 % (ref 11.5–15.5)
WBC: 10.7 10*3/uL — ABNORMAL HIGH (ref 4.0–10.5)
nRBC: 0 % (ref 0.0–0.2)

## 2021-04-27 LAB — LIPASE, BLOOD: Lipase: 86 U/L — ABNORMAL HIGH (ref 11–51)

## 2021-04-27 LAB — CBG MONITORING, ED: Glucose-Capillary: 107 mg/dL — ABNORMAL HIGH (ref 70–99)

## 2021-04-27 LAB — TROPONIN I (HIGH SENSITIVITY)
Troponin I (High Sensitivity): 24 ng/L — ABNORMAL HIGH (ref <18)
Troponin I (High Sensitivity): 53 ng/L — ABNORMAL HIGH (ref <18)

## 2021-04-27 LAB — RESP PANEL BY RT-PCR (FLU A&B, COVID) ARPGX2
Influenza A by PCR: NEGATIVE
Influenza B by PCR: NEGATIVE
SARS Coronavirus 2 by RT PCR: NEGATIVE

## 2021-04-27 MED ORDER — DIPHENHYDRAMINE HCL 50 MG/ML IJ SOLN
12.5000 mg | Freq: Once | INTRAMUSCULAR | Status: AC
  Administered 2021-04-27: 12.5 mg via INTRAVENOUS
  Filled 2021-04-27: qty 1

## 2021-04-27 MED ORDER — AMLODIPINE BESYLATE 5 MG PO TABS
10.0000 mg | ORAL_TABLET | Freq: Once | ORAL | Status: AC
  Administered 2021-04-27: 10 mg via ORAL
  Filled 2021-04-27: qty 2

## 2021-04-27 MED ORDER — ONDANSETRON HCL 4 MG/2ML IJ SOLN
4.0000 mg | Freq: Once | INTRAMUSCULAR | Status: AC
  Administered 2021-04-27: 4 mg via INTRAVENOUS
  Filled 2021-04-27: qty 2

## 2021-04-27 MED ORDER — SODIUM CHLORIDE 0.9 % IV SOLN
1.0000 g | INTRAVENOUS | Status: DC
  Filled 2021-04-27: qty 10

## 2021-04-27 MED ORDER — ENOXAPARIN SODIUM 30 MG/0.3ML IJ SOSY
30.0000 mg | PREFILLED_SYRINGE | INTRAMUSCULAR | Status: DC
  Administered 2021-04-28 (×2): 30 mg via SUBCUTANEOUS
  Filled 2021-04-27 (×2): qty 0.3

## 2021-04-27 MED ORDER — LACTATED RINGERS IV SOLN: INTRAVENOUS | Status: DC

## 2021-04-27 MED ORDER — ENOXAPARIN SODIUM 40 MG/0.4ML IJ SOSY: 40.0000 mg | PREFILLED_SYRINGE | INTRAMUSCULAR | Status: DC

## 2021-04-27 MED ORDER — MORPHINE SULFATE (PF) 4 MG/ML IV SOLN
4.0000 mg | Freq: Once | INTRAVENOUS | Status: AC
  Administered 2021-04-27: 4 mg via INTRAVENOUS
  Filled 2021-04-27: qty 1

## 2021-04-27 MED ORDER — ONDANSETRON HCL 4 MG/2ML IJ SOLN
4.0000 mg | Freq: Four times a day (QID) | INTRAMUSCULAR | Status: DC | PRN
  Administered 2021-04-28 – 2021-04-29 (×5): 4 mg via INTRAVENOUS
  Filled 2021-04-27 (×5): qty 2

## 2021-04-27 MED ORDER — CARVEDILOL 25 MG PO TABS
25.0000 mg | ORAL_TABLET | Freq: Two times a day (BID) | ORAL | Status: DC
  Administered 2021-04-28: 25 mg via ORAL
  Filled 2021-04-27: qty 1

## 2021-04-27 MED ORDER — AMLODIPINE BESYLATE 10 MG PO TABS: 10.0000 mg | ORAL_TABLET | Freq: Every day | ORAL | Status: DC

## 2021-04-27 MED ORDER — DROPERIDOL 2.5 MG/ML IJ SOLN
0.6250 mg | Freq: Once | INTRAMUSCULAR | Status: AC
  Administered 2021-04-27: 0.625 mg via INTRAVENOUS
  Filled 2021-04-27: qty 2

## 2021-04-27 MED ORDER — CLONIDINE HCL 0.1 MG PO TABS
0.2000 mg | ORAL_TABLET | Freq: Three times a day (TID) | ORAL | Status: DC
  Administered 2021-04-28: 0.2 mg via ORAL
  Filled 2021-04-27: qty 2

## 2021-04-27 MED ORDER — HYDRALAZINE HCL 20 MG/ML IJ SOLN
10.0000 mg | Freq: Once | INTRAMUSCULAR | Status: AC
  Administered 2021-04-27: 10 mg via INTRAVENOUS
  Filled 2021-04-27: qty 1

## 2021-04-27 MED ORDER — LABETALOL HCL 5 MG/ML IV SOLN: 5.0000 mg | INTRAVENOUS | Status: DC | PRN

## 2021-04-27 MED ORDER — LACTATED RINGERS IV BOLUS
1000.0000 mL | Freq: Once | INTRAVENOUS | Status: AC
  Administered 2021-04-27: 1000 mL via INTRAVENOUS

## 2021-04-27 MED ORDER — ASPIRIN EC 81 MG PO TBEC
81.0000 mg | DELAYED_RELEASE_TABLET | Freq: Every day | ORAL | Status: DC
  Administered 2021-04-28 – 2021-04-29 (×2): 81 mg via ORAL
  Filled 2021-04-27 (×2): qty 1

## 2021-04-27 MED ORDER — ATORVASTATIN CALCIUM 10 MG PO TABS
10.0000 mg | ORAL_TABLET | Freq: Every day | ORAL | Status: DC
  Administered 2021-04-28 – 2021-04-29 (×2): 10 mg via ORAL
  Filled 2021-04-27 (×2): qty 1

## 2021-04-27 MED ORDER — SODIUM CHLORIDE 0.9 % IV SOLN
1.0000 g | Freq: Once | INTRAVENOUS | Status: AC
  Administered 2021-04-27: 1 g via INTRAVENOUS
  Filled 2021-04-27: qty 10

## 2021-04-27 MED ORDER — ONDANSETRON HCL 4 MG/2ML IJ SOLN
4.0000 mg | Freq: Once | INTRAMUSCULAR | Status: DC
  Administered 2021-04-27: 4 mg via INTRAVENOUS
  Filled 2021-04-27: qty 2

## 2021-04-27 MED ORDER — SODIUM CHLORIDE 0.9 % IV BOLUS
1000.0000 mL | Freq: Once | INTRAVENOUS | Status: AC
  Administered 2021-04-27: 1000 mL via INTRAVENOUS

## 2021-04-27 MED ORDER — FENTANYL CITRATE PF 50 MCG/ML IJ SOSY
50.0000 ug | PREFILLED_SYRINGE | Freq: Once | INTRAMUSCULAR | Status: AC
  Administered 2021-04-27: 50 ug via INTRAVENOUS
  Filled 2021-04-27: qty 1

## 2021-04-27 NOTE — ED Triage Notes (Signed)
Pt reports going to a cookout Saturday. Pt woke up feeling bad Sunday with N/V and unable to tolerate PO intake since.   Pt reports hx of hypertension but unable to keep medications down due to emesis.   Denies abdominal pain or chest pain.   A/Ox4 Ambulatory in triage.

## 2021-04-27 NOTE — ED Notes (Signed)
Pt was able to ambulate to and from restroom with minimal assistance.

## 2021-04-27 NOTE — ED Notes (Signed)
[place], EDP aware patient's HR is sustaining in the 130-140s.

## 2021-04-27 NOTE — H&P (Addendum)
History and Physical    OMEGA DURANTE LEX:[id number] DOB: 11/01/1950 DOA: 04/27/2021  PCP: Lucianne Lei, MD  Patient coming from: Home  I have personally briefly reviewed patient's old medical records in [place]  Chief Complaint: nausea, vomiting  HPI: Tamara Friedman is a 70 y.o. female with medical history significant for CHF, hypertension, CKD, hyperlipidemia who presents with concerns of nausea and vomiting.  Patient unable to provide history as she was lethargic and extremely drowsy after receiving IV Benadryl in the ED.  Only able to tell me she has been feeling sick since Sunday.  Per ED PA report, patient was at a cookout about 2 days ago and the following day began to have nausea and vomiting and abdominal pain.  Has not been able to take her blood pressure medication.  Patient was afebrile and presented with blood pressure of 205/143.  She later also became more tachycardic up to heart rate of 130s that ED provider thought could be secondary to anxiety.  She was given droperidol, IV Benadryl.  CBC showed leukocytosis of 10.7, hemoglobin of 13.5, platelet of 422.  Lactate of 2.2.  Sodium of 143, potassium of 4.7, creatinine of 2.18 from a prior of 1.88, BG of 141.  Gap of 16.  LFTs were normal.  Lipase of 86.  Troponin of 24. Sign UA negative for nitrite, leukocyte but have many bacteria and also 6-10 squamous epithelial cells. EKG showing LVH and ST depression in the inferior leads.  She was given 2 L of IV fluids, Zofran, 50 mcg of fentanyl and started on IV Rocephin.  She was also resumed on her home amlodipine and given IV hydralazine 10 mg in the ED for her hypertension.  Hospitalist was then called for admission  Review of Systems: Unable to obtain since patient was lethargic after receiving IV medication  Past Medical History:  Diagnosis Date   High cholesterol    Hypertension     Past Surgical History:  Procedure Laterality Date   PARTIAL HYSTERECTOMY        reports that she has never smoked. She has never used smokeless tobacco. She reports that she does not drink alcohol and does not use drugs. Social History  No Known Allergies  Family Hx Unknown to obtain since pt was lethargic    Prior to Admission medications   Medication Sig Start Date End Date Taking? Authorizing Provider  amLODipine (NORVASC) 10 MG tablet Take 10 mg by mouth daily. 03/30/21  Yes [provider]  aspirin 81 MG tablet Take 81 mg by mouth daily.    Yes [provider]  atorvastatin (LIPITOR) 10 MG tablet Take 10 mg by mouth daily.   Yes [provider]  carvedilol (COREG) 25 MG tablet Take 25 mg by mouth 2 (two) times daily with a meal.   Yes [provider]  cloNIDine (CATAPRES) 0.2 MG tablet Take 0.2 mg by mouth 3 (three) times daily.   Yes [provider]  ibuprofen (ADVIL) 200 MG tablet Take 400 mg by mouth every 6 (six) hours as needed for moderate pain.   Yes [provider]  lisinopril (ZESTRIL) 40 MG tablet Take 40 mg by mouth 2 (two) times daily. 03/30/21  Yes [provider]  dicyclomine (BENTYL) 20 MG tablet Take 1 tablet (20 mg total) by mouth 2 (two) times daily as needed for up to 20 doses for spasms. Abdominal cramping Patient not taking: Reported on 04/27/2021 01/12/21   Trifan,  Carola Rhine, MD  ondansetron (ZOFRAN ODT) 4 MG disintegrating tablet Take 1 tablet (4 mg total) by mouth every 8 (eight) hours as needed for up to 15 doses for nausea or vomiting. Patient not taking: Reported on 04/27/2021 01/12/21   Wyvonnia Dusky, MD    Physical Exam: Vitals:   04/27/21 1730 04/27/21 1741 04/27/21 1800 04/27/21 1830  BP: 121/79  (!) 172/103 (!) 155/95  Pulse: (!) 132 (!) 155 (!) 132 (!) 132  Resp: (!) 40 (!) 21 (!) 22 (!) 25  Temp:      TempSrc:      SpO2: 97%  99% 99%    Constitutional: NAD, lethargic and drowsy elderly female laying asleep flat in bed.  She awoke to voice and light touch  but but immediately fall back asleep. Vitals:   04/27/21 1730 04/27/21 1741 04/27/21 1800 04/27/21 1830  BP: 121/79  (!) 172/103 (!) 155/95  Pulse: (!) 132 (!) 155 (!) 132 (!) 132  Resp: (!) 40 (!) 21 (!) 22 (!) 25  Temp:      TempSrc:      SpO2: 97%  99% 99%   Eyes: PERRL, lids and conjunctivae normal ENMT: Mucous membranes are moist.  Neck: normal, supple Respiratory: clear to auscultation bilaterally, no wheezing, no crackles. Normal respiratory effort. No accessory muscle use.  Cardiovascular: Regular rate and rhythm, no murmurs / rubs / gallops. No extremity edema. 2+ pedal pulses. No carotid bruits.  Abdomen: no tenderness, rebound tenderness, guarding or rigidity.. Bowel sounds positive.  Musculoskeletal: no clubbing / cyanosis. No joint deformity upper and lower extremities.  Skin: no rashes, lesions, ulcers. No induration Neurologic: Patient lethargic in bed but awoke easily to voice and light touch but would fall back asleep.  She was able to perform bilateral handgrip.  Only able to tell me she has been feeling sick but could not give more details. Psychiatric: Unable to assess due to lethargy    Labs on Admission: I have personally reviewed following labs and imaging studies  CBC: Recent Labs  Lab 04/27/21 1252  WBC 10.7*  HGB 13.5  HCT 44.7  MCV 82.6  PLT 989*   Basic Metabolic Panel: Recent Labs  Lab 04/27/21 1252  NA 143  K 4.7  CL 105  CO2 22  GLUCOSE 141*  BUN 27*  CREATININE 2.18*  CALCIUM 10.2   GFR: CrCl cannot be calculated (Unknown ideal weight.). Liver Function Tests: Recent Labs  Lab 04/27/21 1252  AST 28  ALT 17  ALKPHOS 117  BILITOT 0.8  PROT 9.5*  ALBUMIN 4.5   Recent Labs  Lab 04/27/21 1252  LIPASE 86*   No results for input(s): AMMONIA in the last 168 hours. Coagulation Profile: No results for input(s): INR, PROTIME in the last 168 hours. Cardiac Enzymes: No results for input(s): CKTOTAL, CKMB, CKMBINDEX, TROPONINI in  the last 168 hours. BNP (last 3 results) No results for input(s): PROBNP in the last 8760 hours. HbA1C: No results for input(s): HGBA1C in the last 72 hours. CBG: Recent Labs  Lab 04/27/21 1614  GLUCAP 107*   Lipid Profile: No results for input(s): CHOL, HDL, LDLCALC, TRIG, CHOLHDL, LDLDIRECT in the last 72 hours. Thyroid Function Tests: No results for input(s): TSH, T4TOTAL, FREET4, T3FREE, THYROIDAB in the last 72 hours. Anemia Panel: No results for input(s): VITAMINB12, FOLATE, FERRITIN, TIBC, IRON, RETICCTPCT in the last 72 hours. Urine analysis:    Component Value Date/Time   COLORURINE YELLOW 04/27/2021 1218   APPEARANCEUR  CLOUDY (A) 04/27/2021 1218   LABSPEC 1.025 04/27/2021 1218   PHURINE 6.0 04/27/2021 1218   GLUCOSEU NEGATIVE 04/27/2021 1218   HGBUR MODERATE (A) 04/27/2021 1218   BILIRUBINUR NEGATIVE 04/27/2021 [place] 04/27/2021 1218   PROTEINUR 100 (A) 04/27/2021 1218   UROBILINOGEN 0.2 07/19/2013 2044   NITRITE NEGATIVE 04/27/2021 [place] 04/27/2021 1218    Radiological Exams on Admission: CT ABDOMEN PELVIS WO CONTRAST  Result Date: 04/27/2021 CLINICAL DATA:  Abdominal pain, nausea and vomiting. EXAM: CT ABDOMEN AND PELVIS WITHOUT CONTRAST TECHNIQUE: Multidetector CT imaging of the abdomen and pelvis was performed following the standard protocol without IV contrast. COMPARISON:  01/12/2021 FINDINGS: Lower chest: Stable moderate to large sized hiatal hernia. The heart is normal in size. No pericardial effusion. Stable aortic and coronary artery calcifications. No pleural effusions or pulmonary infiltrates. Hepatobiliary: No hepatic lesions or intrahepatic biliary dilatation. Layering higher attenuation material in the dependent portion the gallbladder likely gallbladder sludge. No findings to suggest acute cholecystitis. No common bile duct dilatation. Pancreas: No mass, inflammation or ductal dilatation. Spleen: Normal size. No  focal lesions. Adrenals/Urinary Tract: Adrenal glands are unremarkable. Chronic left renal scarring changes and left renal cysts. No worrisome renal lesions or hydronephrosis. No obstructing ureteral calculi. No bladder calculi or bladder mass. There is a small amount of gas in the bladder which could be due to recent catheterization. Stomach/Bowel: The stomach, duodenum, small bowel and colon are grossly normal. No acute inflammatory changes, mass lesions or obstructive findings. The terminal ileum is normal. The appendix is normal. Descending and sigmoid colon diverticulosis but no findings to suggest acute diverticulitis. Vascular/Lymphatic: Stable atherosclerotic calcifications involving the aorta and iliac arteries but no aneurysm. No mesenteric or retroperitoneal mass or adenopathy. Reproductive: Surgically absent. Other: Small periumbilical abdominal wall hernia containing fat and a small bowel loop but no findings for incarceration or obstruction. Musculoskeletal: Stable degenerative changes involving the spine and hips. No acute bony findings or bone lesions. IMPRESSION: 1. No acute abdominal/pelvic findings, mass lesions or adenopathy. 2. Stable moderate to large sized hiatal hernia. 3. Layering higher attenuation material in the gallbladder likely gallbladder sludge. No findings to suggest acute cholecystitis. 4. Chronic left renal scarring changes and left renal cysts. 5. Small periumbilical abdominal wall hernia containing fat and a small bowel loop but no findings for incarceration or obstruction. Aortic Atherosclerosis (ICD10-I70.0). Electronically Signed   By: Marijo Sanes M.D.   On: 04/27/2021 16:25   CT HEAD WO CONTRAST (5MM)  Result Date: 04/27/2021 CLINICAL DATA:  70 year old female with history of altered mental status. Confusion. Headache. EXAM: CT HEAD WITHOUT CONTRAST TECHNIQUE: Contiguous axial images were obtained from the base of the skull through the vertex without intravenous  contrast. COMPARISON:  Head CT 01/12/2021. FINDINGS: Brain: Patchy and confluent areas of decreased attenuation are noted throughout the deep and periventricular white matter of the cerebral hemispheres bilaterally, compatible with chronic microvascular ischemic disease. No evidence of acute infarction, hemorrhage, hydrocephalus, extra-axial collection or mass lesion/mass effect. Vascular: No hyperdense vessel or unexpected calcification. Skull: Normal. Negative for fracture or focal lesion. Sinuses/Orbits: No acute finding. Other: None. IMPRESSION: 1. No acute intracranial abnormalities. 2. Chronic microvascular ischemic changes in the cerebral white matter, similar to the prior study, as above. Electronically Signed   By: Vinnie Langton M.D.   On: 04/27/2021 17:21      Assessment/Plan  Sepsis secondary to probable UTI - Patient subsequently developed tachycardia, tachypnea and had leukocytosis  -  UA showing negative nitrite, negative leukocyte and many bacteria but also had 6-10 squamous epithelial cell.  UA possibly contaminated but patient had CT abdomen/pelvis with no other source of infection.  Continue IV Rocephin pending urine culture -Lipase was mildly elevated at 86 but no findings of pancreatitis on CT and patient had benign abdominal exam  AKI on CKD/metabolic acidosis - Creatinine of 2.18 from a prior of 1.88 with anion gap 16 - Has already received 2 L of IV fluid resuscitation.  Follow with repeat labs in the morning - Avoid nephrotoxic agent  Elevated troponin/EKG abnormality - Troponin elevated to 24 with some EKG findings of LVH and ST depression of V4 to V6.  Suspect elevated troponin more related to worsening renal insufficiency and EKG changes could be due to worsening hypertension with LV strain. -Trend 1 more troponin  Hypotension -Initially hypertensive with systolic of 800 but after receiving IV hydralazine in the ED and resuming her amlodipine, Coreg, Clonidine she  became hypotensive down to systolic of 34J -Give 1 L normal saline bolus.  Will hold all home antihypertensives this time.  CHF  -no echo on record to access EF  -judicious use of IV fluids   DVT prophylaxis:.Lovenox Code Status: Full Family Communication: Plan discussed with patient at bedside  disposition Plan: Home with observation Consults called:  Admission status: Observation  Level of care: Telemetry  Status is: Observation  The patient remains OBS appropriate and will d/c before 2 midnights.  Dispo: The patient is from: Home              Anticipated d/c is to: Home              Patient currently is not medically stable to d/c.   Difficult to place patient No         Orene Desanctis DO [facility]   If 7PM-7AM, please contact night-coverage [email or web address]   04/27/2021, 6:58 PM

## 2021-04-27 NOTE — ED Provider Notes (Addendum)
[facility] DEPT Provider Note   CSN: [id number] Arrival date & time: 04/27/21  1134     History Chief Complaint  Patient presents with   Emesis    Tamara Friedman is a 70 y.o. female.  HPI  70 year old female with a history of hyperlipidemia, hypertension, presents to the emergency department today for evaluation of nausea and vomiting that started the day after she attended a barbecue.  She reports no sick contacts.  She does have some lower abdominal pain.  She has not been able to keep her blood pressure medications down.  She denies any chest pain, shortness of breath or any severe headaches.  She does not report any urinary symptoms.  Past Medical History:  Diagnosis Date   High cholesterol    Hypertension     Patient Active Problem List   Diagnosis Date Noted   Hypertensive urgency 04/28/2021   Acute renal failure superimposed on stage 4 chronic kidney disease ([place]) 04/27/2021   Elevated troponin 04/27/2021   HTN (hypertension) 04/27/2021   Chronic CHF ([place]) 04/27/2021    Past Surgical History:  Procedure Laterality Date   PARTIAL HYSTERECTOMY       OB History   No obstetric history on file.     No family history on file.  Social History   Tobacco Use   Smoking status: Never   Smokeless tobacco: Never  Substance Use Topics   Alcohol use: No   Drug use: No    Home Medications Prior to Admission medications   Medication Sig Start Date End Date Taking? Authorizing Provider  aspirin 81 MG tablet Take 81 mg by mouth daily.    Yes [provider]  atorvastatin (LIPITOR) 10 MG tablet Take 10 mg by mouth daily.   Yes [provider]  carvedilol (COREG) 25 MG tablet Take 25 mg by mouth 2 (two) times daily with a meal.   Yes [provider]  ibuprofen (ADVIL) 200 MG tablet Take 400 mg by mouth every 6 (six) hours as needed for moderate pain.   Yes [provider]  cloNIDine (CATAPRES) 0.1 MG  tablet Take 1 tablet (0.1 mg total) by mouth 3 (three) times daily. 04/29/21   [name], MD  lisinopril (ZESTRIL) 40 MG tablet Take 1 tablet (40 mg total) by mouth daily. 04/29/21   [name], MD    Allergies    Patient has no known allergies.  Review of Systems   Review of Systems  Constitutional:  Negative for chills and fever.  HENT:  Negative for ear pain and sore throat.   Eyes:  Negative for pain and visual disturbance.  Respiratory:  Negative for cough and shortness of breath.   Cardiovascular:  Negative for chest pain.  Gastrointestinal:  Positive for abdominal pain, nausea and vomiting. Negative for constipation and diarrhea.  Genitourinary:  Negative for dysuria and hematuria.  Musculoskeletal:  Negative for back pain.  Skin:  Negative for color change and rash.  Neurological:  Negative for headaches.  All other systems reviewed and are negative.  Physical Exam Updated Vital Signs BP (!) 141/127   Pulse 66   Temp 98.9 F (37.2 C) (Oral)   Resp 17   Ht 5\' 6"  (1.676 m)   SpO2 100%   BMI 25.82 kg/m   Physical Exam Vitals and nursing note reviewed.  Constitutional:      General: She is not in acute distress.    Appearance: She is well-developed.  HENT:  Head: Normocephalic and atraumatic.  Eyes:     Conjunctiva/sclera: Conjunctivae normal.  Cardiovascular:     Rate and Rhythm: Normal rate and regular rhythm.     Heart sounds: Normal heart sounds. No murmur heard. Pulmonary:     Effort: Pulmonary effort is normal. No respiratory distress.     Breath sounds: Normal breath sounds. No wheezing, rhonchi or rales.  Abdominal:     General: Bowel sounds are normal.     Palpations: Abdomen is soft.     Tenderness: There is abdominal tenderness (bilat lower abd). There is no guarding or rebound.  Musculoskeletal:     Cervical back: Neck supple.  Skin:    General: Skin is warm and dry.  Neurological:     Mental Status: She is alert.    ED Results /  Procedures / Treatments   Labs (all labs ordered are listed, but only abnormal results are displayed) Labs Reviewed  LIPASE, BLOOD - Abnormal; Notable for the following components:      Result Value   Lipase 86 (*)    All other components within normal limits  COMPREHENSIVE METABOLIC PANEL - Abnormal; Notable for the following components:   Glucose, Bld 141 (*)    BUN 27 (*)    Creatinine, Ser 2.18 (*)    Total Protein 9.5 (*)    GFR, Estimated 23 (*)    Anion gap 16 (*)    All other components within normal limits  CBC - Abnormal; Notable for the following components:   WBC 10.7 (*)    RBC 5.41 (*)    MCH 25.0 (*)    Platelets 422 (*)    All other components within normal limits  URINALYSIS, ROUTINE W REFLEX MICROSCOPIC - Abnormal; Notable for the following components:   APPearance CLOUDY (*)    Hgb urine dipstick MODERATE (*)    Protein, ur 100 (*)    WBC, UA >50 (*)    Bacteria, UA MANY (*)    All other components within normal limits  LACTIC ACID, PLASMA - Abnormal; Notable for the following components:   Lactic Acid, Venous 2.2 (*)    All other components within normal limits  LACTIC ACID, PLASMA - Abnormal; Notable for the following components:   Lactic Acid, Venous 2.8 (*)    All other components within normal limits  CBC - Abnormal; Notable for the following components:   Hemoglobin 10.1 (*)    HCT 34.2 (*)    MCH 25.0 (*)    MCHC 29.5 (*)    All other components within normal limits  BASIC METABOLIC PANEL - Abnormal; Notable for the following components:   Chloride 114 (*)    CO2 20 (*)    BUN 31 (*)    Creatinine, Ser 1.86 (*)    Calcium 8.8 (*)    GFR, Estimated 28 (*)    All other components within normal limits  BASIC METABOLIC PANEL - Abnormal; Notable for the following components:   BUN 30 (*)    Creatinine, Ser 2.12 (*)    Calcium 8.3 (*)    GFR, Estimated 24 (*)    All other components within normal limits  CBG MONITORING, ED - Abnormal; Notable  for the following components:   Glucose-Capillary 107 (*)    All other components within normal limits  TROPONIN I (HIGH SENSITIVITY) - Abnormal; Notable for the following components:   Troponin I (High Sensitivity) 24 (*)    All other components within normal  limits  TROPONIN I (HIGH SENSITIVITY) - Abnormal; Notable for the following components:   Troponin I (High Sensitivity) 53 (*)    All other components within normal limits  TROPONIN I (HIGH SENSITIVITY) - Abnormal; Notable for the following components:   Troponin I (High Sensitivity) 55 (*)    All other components within normal limits  TROPONIN I (HIGH SENSITIVITY) - Abnormal; Notable for the following components:   Troponin I (High Sensitivity) 38 (*)    All other components within normal limits  RESP PANEL BY RT-PCR (FLU A&B, COVID) [id number]  LACTIC ACID, PLASMA    EKG EKG Interpretation  Date/Time:  Tuesday April 27 2021 17:12:52 EDT Ventricular Rate:  127 PR Interval:  58 QRS Duration: 139 QT Interval:  330 QTC Calculation: 480 R Axis:   61 Text Interpretation: Sinus or ectopic atrial tachycardia LVH with secondary repolarization abnormality Artifact in lead(s) I II III aVL V1 V2 artifact may limit interpretation Confirmed by Thayer Jew [phone]) on 04/28/2021 2:42:34 PM  Radiology No results found.  Procedures Procedures   Medications Ordered in ED Medications  ondansetron (ZOFRAN) injection 4 mg (4 mg Intravenous Given 04/27/21 1302)  fentaNYL (SUBLIMAZE) injection 50 mcg (50 mcg Intravenous Given 04/27/21 1302)  amLODipine (NORVASC) tablet 10 mg (10 mg Oral Given 04/27/21 1301)  sodium chloride 0.9 % bolus 1,000 mL (0 mLs Intravenous Stopped 04/27/21 1533)  cefTRIAXone (ROCEPHIN) 1 g in sodium chloride 0.9 % 100 mL IVPB (0 g Intravenous Stopped 04/27/21 1533)  hydrALAZINE (APRESOLINE) injection 10 mg (10 mg Intravenous Given 04/27/21 1513)  lactated ringers bolus 1,000 mL (0 mLs Intravenous Stopped 04/27/21  1734)  droperidol (INAPSINE) 2.5 MG/ML injection 0.625 mg (0.625 mg Intravenous Given 04/27/21 1618)  diphenhydrAMINE (BENADRYL) injection 12.5 mg (12.5 mg Intravenous Given 04/27/21 1602)  ondansetron (ZOFRAN) injection 4 mg (4 mg Intravenous Given 04/27/21 1819)  morphine 4 MG/ML injection 4 mg (4 mg Intravenous Given 04/27/21 1823)  sodium chloride 0.9 % bolus 1,000 mL (1,000 mLs Intravenous New Bag/Given 04/28/21 0218)    ED Course  I have reviewed the triage vital signs and the nursing notes.  Pertinent labs & imaging results that were available during my care of the patient were reviewed by me and considered in my medical decision making (see chart for details).    MDM Rules/Calculators/A&P                          Pt here for eval of nv that started yesterday, c/o lower abd pain  Reviewed/interpreted labs Cbc with mild leukocytosis Cmp with worsening renal function Lactic acidosis present Marginally elevated trop, suspect due to demand Lipase negative  Ekg shows sinus tachycardia  Reviewed/interpreted imaging Ct abd/pelvis - 1. No acute abdominal/pelvic findings, mass lesions or adenopathy. 2. Stable moderate to large sized hiatal hernia. 3. Layering higher attenuation material in the gallbladder likely gallbladder sludge. No findings to suggest acute cholecystitis. 4. Chronic left renal scarring changes and left renal cysts. 5. Small periumbilical abdominal wall hernia containing fat and a small bowel loop but no findings for incarceration or obstruction. Aortic Atherosclerosis  Ct head ordered due to change in mental status during eval - neg for acute findings, mental status improved somewhat after ct, no lateralizing neuro sxs  Pt received ivf, pain meds, droperidol, nausea meds and abx for her uti. Suspect enteritis vs nv in setting of uti. Will admit for further management  Pt seen in conjunction with Dr  Kommor who personally evaluated the patient and is in agreement  with the plan  6:40 PM CONSULT with Dr. Flossie Buffy who accepts patient for admission     Final Clinical Impression(s) / ED Diagnoses Final diagnoses:  Enteritis    Rx / DC Orders ED Discharge Orders          Ordered    Increase activity slowly        04/29/21 1118    Diet - low sodium heart healthy        04/29/21 1118    cloNIDine (CATAPRES) 0.1 MG tablet  3 times daily        04/29/21 1256    lisinopril (ZESTRIL) 40 MG tablet  Daily        04/29/21 [address], [place], PA-C 04/27/21 1845    Blanchie Dessert, MD 04/28/21 [address], [place]less S, PA-C 04/30/21 [place], [place], MD 05/05/21 2338

## 2021-04-27 NOTE — ED Notes (Signed)
Pt ambulatory with minimal assistance.

## 2021-04-27 NOTE — ED Notes (Signed)
Patient transported to CT 

## 2021-04-28 ENCOUNTER — Observation Stay (HOSPITAL_BASED_OUTPATIENT_CLINIC_OR_DEPARTMENT_OTHER): Payer: Medicare Other

## 2021-04-28 DIAGNOSIS — R112 Nausea with vomiting, unspecified: Secondary | ICD-10-CM | POA: Diagnosis not present

## 2021-04-28 DIAGNOSIS — R778 Other specified abnormalities of plasma proteins: Secondary | ICD-10-CM

## 2021-04-28 DIAGNOSIS — I509 Heart failure, unspecified: Secondary | ICD-10-CM | POA: Diagnosis not present

## 2021-04-28 DIAGNOSIS — N184 Chronic kidney disease, stage 4 (severe): Secondary | ICD-10-CM | POA: Diagnosis not present

## 2021-04-28 DIAGNOSIS — I160 Hypertensive urgency: Secondary | ICD-10-CM | POA: Diagnosis not present

## 2021-04-28 DIAGNOSIS — N189 Chronic kidney disease, unspecified: Secondary | ICD-10-CM | POA: Diagnosis not present

## 2021-04-28 DIAGNOSIS — I161 Hypertensive emergency: Secondary | ICD-10-CM

## 2021-04-28 DIAGNOSIS — M1990 Unspecified osteoarthritis, unspecified site: Secondary | ICD-10-CM | POA: Diagnosis not present

## 2021-04-28 DIAGNOSIS — R9431 Abnormal electrocardiogram [ECG] [EKG]: Secondary | ICD-10-CM

## 2021-04-28 DIAGNOSIS — E785 Hyperlipidemia, unspecified: Secondary | ICD-10-CM | POA: Diagnosis not present

## 2021-04-28 DIAGNOSIS — R519 Headache, unspecified: Secondary | ICD-10-CM | POA: Diagnosis not present

## 2021-04-28 DIAGNOSIS — N179 Acute kidney failure, unspecified: Secondary | ICD-10-CM | POA: Diagnosis not present

## 2021-04-28 DIAGNOSIS — I10 Essential (primary) hypertension: Secondary | ICD-10-CM | POA: Diagnosis not present

## 2021-04-28 DIAGNOSIS — Z20822 Contact with and (suspected) exposure to covid-19: Secondary | ICD-10-CM | POA: Diagnosis not present

## 2021-04-28 DIAGNOSIS — Z79899 Other long term (current) drug therapy: Secondary | ICD-10-CM | POA: Diagnosis not present

## 2021-04-28 DIAGNOSIS — Z7982 Long term (current) use of aspirin: Secondary | ICD-10-CM | POA: Diagnosis not present

## 2021-04-28 DIAGNOSIS — I130 Hypertensive heart and chronic kidney disease with heart failure and stage 1 through stage 4 chronic kidney disease, or unspecified chronic kidney disease: Secondary | ICD-10-CM | POA: Diagnosis not present

## 2021-04-28 DIAGNOSIS — K529 Noninfective gastroenteritis and colitis, unspecified: Secondary | ICD-10-CM | POA: Diagnosis not present

## 2021-04-28 LAB — BASIC METABOLIC PANEL
Anion gap: 9 (ref 5–15)
BUN: 31 mg/dL — ABNORMAL HIGH (ref 8–23)
CO2: 20 mmol/L — ABNORMAL LOW (ref 22–32)
Calcium: 8.8 mg/dL — ABNORMAL LOW (ref 8.9–10.3)
Chloride: 114 mmol/L — ABNORMAL HIGH (ref 98–111)
Creatinine, Ser: 1.86 mg/dL — ABNORMAL HIGH (ref 0.44–1.00)
GFR, Estimated: 28 mL/min — ABNORMAL LOW (ref >60)
Glucose, Bld: 89 mg/dL (ref 70–99)
Potassium: 4.3 mmol/L (ref 3.5–5.1)
Sodium: 143 mmol/L (ref 135–145)

## 2021-04-28 LAB — ECHOCARDIOGRAM COMPLETE
Area-P 1/2: 3.68 cm2
S' Lateral: 2.8 cm

## 2021-04-28 LAB — CBC
HCT: 34.2 % — ABNORMAL LOW (ref 36.0–46.0)
Hemoglobin: 10.1 g/dL — ABNORMAL LOW (ref 12.0–15.0)
MCH: 25 pg — ABNORMAL LOW (ref 26.0–34.0)
MCHC: 29.5 g/dL — ABNORMAL LOW (ref 30.0–36.0)
MCV: 84.7 fL (ref 80.0–100.0)
Platelets: 256 10*3/uL (ref 150–400)
RBC: 4.04 MIL/uL (ref 3.87–5.11)
RDW: 15.3 % (ref 11.5–15.5)
WBC: 8.7 10*3/uL (ref 4.0–10.5)
nRBC: 0 % (ref 0.0–0.2)

## 2021-04-28 LAB — TROPONIN I (HIGH SENSITIVITY)
Troponin I (High Sensitivity): 55 ng/L — ABNORMAL HIGH (ref <18)
Troponin I (High Sensitivity): 38 ng/L — ABNORMAL HIGH (ref <18)

## 2021-04-28 MED ORDER — HYDRALAZINE HCL 20 MG/ML IJ SOLN: 10.0000 mg | INTRAMUSCULAR | Status: DC | PRN

## 2021-04-28 MED ORDER — CARVEDILOL 25 MG PO TABS
25.0000 mg | ORAL_TABLET | Freq: Two times a day (BID) | ORAL | Status: DC
  Administered 2021-04-28 – 2021-04-29 (×2): 25 mg via ORAL
  Filled 2021-04-28 (×3): qty 1

## 2021-04-28 MED ORDER — AMLODIPINE BESYLATE 10 MG PO TABS
10.0000 mg | ORAL_TABLET | Freq: Every day | ORAL | Status: DC
  Administered 2021-04-28 – 2021-04-29 (×2): 10 mg via ORAL
  Filled 2021-04-28 (×2): qty 1

## 2021-04-28 MED ORDER — LABETALOL HCL 5 MG/ML IV SOLN: 10.0000 mg | INTRAVENOUS | Status: DC | PRN

## 2021-04-28 MED ORDER — SODIUM CHLORIDE 0.9 % IV BOLUS
1000.0000 mL | Freq: Once | INTRAVENOUS | Status: AC
  Administered 2021-04-28: 1000 mL via INTRAVENOUS

## 2021-04-28 MED ORDER — MELATONIN 3 MG PO TABS
6.0000 mg | ORAL_TABLET | Freq: Every evening | ORAL | Status: DC | PRN
  Administered 2021-04-28: 6 mg via ORAL
  Filled 2021-04-28: qty 2

## 2021-04-28 MED ORDER — CLONIDINE HCL 0.1 MG PO TABS
0.2000 mg | ORAL_TABLET | Freq: Three times a day (TID) | ORAL | Status: DC
  Administered 2021-04-28 – 2021-04-29 (×3): 0.2 mg via ORAL
  Filled 2021-04-28 (×3): qty 2

## 2021-04-28 NOTE — Hospital Course (Addendum)
Ms. Garrison is a 70 yo female with PMH HTN, HLD, CHF, osteoarthritis who presented to the ER with N/V.  She had started becoming sick on Sunday prior to admission.  After developing nausea and vomiting she then had some abdominal pain complaints.  Due to her symptoms she had not been able to take her home medications including her blood pressure medications. On arrival she was found to be in hypertensive urgency, 205/143 and tachycardic.  She was treated with antiemetics and IV Benadryl. After treatment, she became lethargic and was sent for CT head which showed no acute abnormalities with underlying chronic microvascular ischemic changes. In regards to her abdominal pain, she underwent CT abdomen/pelvis.  There were no acute findings to explain her pain complaints.  With further work-up, she was found to have an indeterminate troponin which further up trended.  EKG also concerning for LVH and ST depression in inferior leads initially.  She had no urinary complaints but urinalysis showed some bacteria however was negative for nitrites and leukocyte esterase.  She was started on Rocephin on admission.  This was discontinued the morning following admission.  See below for further A&P.

## 2021-04-28 NOTE — Progress Notes (Signed)
Progress Note    Tamara Friedman   PJK:[id number]  DOB: 1950-09-27  DOA: 04/27/2021     0 Date of Service: 04/28/2021   Tamara Friedman is a 70 yo female with PMH HTN, HLD, CHF, osteoarthritis who presented to the ER with N/V.  She had started becoming sick on Sunday prior to admission.  After developing nausea and vomiting she then had some abdominal pain complaints.  Due to her symptoms she had not been able to take her home medications including her blood pressure medications. On arrival she was found to be in hypertensive urgency, 205/143 and tachycardic.  She was treated with antiemetics and IV Benadryl. After treatment, she became lethargic and was sent for CT head which showed no acute abnormalities with underlying chronic microvascular ischemic changes. In regards to her abdominal pain, she underwent CT abdomen/pelvis.  There were no acute findings to explain her pain complaints.  With further work-up, she was found to have an indeterminate troponin which further up trended.  EKG also concerning for LVH and ST depression in inferior leads initially.  She had no urinary complaints but urinalysis showed some bacteria however was negative for nitrites and leukocyte esterase.  She was started on Rocephin on admission.  This was discontinued the morning following admission.   Subjective:  No events overnight.  Blood pressure beginning to uptrend this morning.  Denies chest pain or shortness of breath.  Appetite improving and she was interested in trying a diet this morning.  Hospital Problems Elevated troponin - Differential includes troponin leak from hypertensive urgency on admission versus ACS with atypical symptoms given female -EKG repeated this morning, now with T wave inversions noted in V2 and V3, not present on prior EKG yesterday - Troponin has continued to uptrend (24 >> 53 >> 55) - follows outpatient with Dr. Terrence Dupont, consult requested in case further workup needed - echo ordered    Acute renal failure superimposed on stage 4 chronic kidney disease ([place]) - patient has history of CKD4. Baseline creat ~ 1.8, eGFR 23-28 - patient presents with increase in creat >0.3 mg/dL above baseline, creat increase >1.5x baseline presumed to have occurred within past 7 days PTA - creatinine 2.18 on admission - presumed pre-renal from volume depletion in setting of N/V - s/p IVF with good response  Hypertensive urgency - Considered due to inability to tolerate home medications in setting of recent nausea and vomiting - Currently symptoms have improved and home meds being resumed  Chronic CHF ([place]) - no s/s exacerbation  - follow up echo   HTN (hypertension) - Patient endorses being typically compliant with medications but was unable to tolerate due to nausea and vomiting on admission - Home regimen being resumed  Intractable vomiting with nausea - differential includes enteritis vs atypical cardiac etiology  - treat supportively for now while further workup initiated   SIRS (systemic inflammatory response syndrome) (HCC)-resolved as of 04/28/2021 - sepsis ruled out - patient has no urinary sxms. UA not concerning for infection with her clinical presentation - d/c abx    Objective Vital signs were reviewed and unremarkable.  Vitals:   04/28/21 0856 04/28/21 1010 04/28/21 1155 04/28/21 1236  BP: (!) 152/84 (!) 178/81 (!) 174/84 (!) 150/78  Pulse: 63 76 64 70  Resp: 18   17  Temp:      TempSrc:      SpO2: 100% 100%  98%      Exam General appearance: alert, cooperative, and no distress Head:  Normocephalic, without obvious abnormality, atraumatic Eyes:  EOMI Lungs: clear to auscultation bilaterally Heart: regular rate and rhythm and S1, S2 normal Abdomen: normal findings: bowel sounds normal and soft, non-tender Extremities:  no edema Skin: mobility and turgor normal Neurologic: Grossly normal   Labs / Other Information My review of labs, imaging, notes and  other tests is significant for elevated troponin     Time spent: Greater than 50% of the 35 minute visit was spent in counseling/coordination of care for the patient as laid out in the A&P.  [name], MD [facility] 04/28/2021, 2:15 PM

## 2021-04-28 NOTE — Progress Notes (Signed)
Pt noted with BP recheck after administration of scheduled clonidine, BP noted to be 95/55 with HR 70. Scheduled coreg held at this time, Jamison Neighbor on call notified and agreed.

## 2021-04-28 NOTE — Assessment & Plan Note (Signed)
-   Considered due to inability to tolerate home medications in setting of recent nausea and vomiting - Currently symptoms have improved and home meds being resumed

## 2021-04-28 NOTE — Assessment & Plan Note (Addendum)
-   sepsis ruled out - patient has no urinary sxms. UA not concerning for infection with her clinical presentation - d/c abx

## 2021-04-28 NOTE — Assessment & Plan Note (Addendum)
-   Differential includes troponin leak from hypertensive urgency on admission versus ACS with atypical symptoms given female -EKG repeated this morning, now with T wave inversions noted in V2 and V3, not present on prior EKG yesterday - Troponin peaked at 55 then started downtrending - evaluated by Dr. Terrence Dupont inpatient as well; troponin leak not felt to be cardiac in origin; likely from uncontrolled BP on admission  - echo ordered: EF 60-65%, no RWMA, normal diastology

## 2021-04-28 NOTE — Assessment & Plan Note (Addendum)
-   no s/s exacerbation 

## 2021-04-28 NOTE — Assessment & Plan Note (Addendum)
-   differential includes enteritis vs atypical cardiac etiology (echo reassuring), she likely has some viral illness from recent cookout and should continue to improve

## 2021-04-28 NOTE — Consult Note (Signed)
Reason for Consult: Minimally elevated high-sensitivity troponin high/minor EKG changes Referring Physician: [facility] hospitalist  Tamara Friedman is an 70 y.o. female.  HPI: Patient is 70 year old female with past medical history significant for nonobstructive CAD, history of congestive heart failure secondary to mildly depressed LV systolic function and remote past, hypertension, hyperlipidemia, chronic kidney disease, degenerative joint disease, was admitted yesterday because of intractable nausea vomiting since Sunday morning after eating food at cookout.  Patient states she could not keep her blood pressure medication down and was noted to be in hypertensive urgency with blood pressure of 205/143 cardiology consultation is called as patient was noted to have minimally elevated high-sensitivity troponin and minor T wave inversion in anterior leads.  Patient denies any chest pain or shortness of breath.  States nausea vomiting is resolved.  States overall feels well.  Denies any abdominal pain fever chills.  Blood pressure is fairly well controlled now.  Patient received IV fluids with improvement in her renal function.  2D echo done earlier today showed no wall motion abnormalities with normal LV systolic function which is improved from prior 2D echo in the past.  Past Medical History:  Diagnosis Date   High cholesterol    Hypertension     Past Surgical History:  Procedure Laterality Date   PARTIAL HYSTERECTOMY      No family history on file.  Social History:  reports that she has never smoked. She has never used smokeless tobacco. She reports that she does not drink alcohol and does not use drugs.  Allergies: No Known Allergies  Medications: I have reviewed the patient's current medications.  Results for orders placed or performed during the hospital encounter of 04/27/21 (from the past 48 hour(s))  Urinalysis, Routine w reflex microscopic Urine, Clean Catch     Status: Abnormal    Collection Time: 04/27/21 12:18 PM  Result Value Ref Range   Color, Urine YELLOW YELLOW   APPearance CLOUDY (A) CLEAR   Specific Gravity, Urine 1.025 1.005 - 1.030   pH 6.0 5.0 - 8.0   Glucose, UA NEGATIVE NEGATIVE mg/dL   Hgb urine dipstick MODERATE (A) NEGATIVE   Bilirubin Urine NEGATIVE NEGATIVE   Ketones, ur NEGATIVE NEGATIVE mg/dL   Protein, ur 100 (A) NEGATIVE mg/dL   Nitrite NEGATIVE NEGATIVE   Leukocytes,Ua NEGATIVE NEGATIVE   RBC / HPF 6-10 0 - 5 RBC/hpf   WBC, UA >50 (H) 0 - 5 WBC/hpf   Bacteria, UA MANY (A) NONE SEEN   Squamous Epithelial / LPF 6-10 0 - 5   Mucus PRESENT     Comment: Performed at [facility], [place] [address] [place], [place] [address]  Lipase, blood     Status: Abnormal   Collection Time: 04/27/21 12:52 PM  Result Value Ref Range   Lipase 86 (H) 11 - 51 U/L    Comment: Performed at [facility], [place] [address] [place], [place] [address]  Comprehensive metabolic panel     Status: Abnormal   Collection Time: 04/27/21 12:52 PM  Result Value Ref Range   Sodium 143 135 - 145 mmol/L   Potassium 4.7 3.5 - 5.1 mmol/L   Chloride 105 98 - 111 mmol/L   CO2 22 22 - 32 mmol/L   Glucose, Bld 141 (H) 70 - 99 mg/dL    Comment: Glucose reference range applies only to samples taken after fasting for at least 8 hours.   BUN 27 (H) 8 - 23 mg/dL  Creatinine, Ser 2.18 (H) 0.44 - 1.00 mg/dL   Calcium 10.2 8.9 - 10.3 mg/dL   Total Protein 9.5 (H) 6.5 - 8.1 g/dL   Albumin 4.5 3.5 - 5.0 g/dL   AST 28 15 - 41 U/L   ALT 17 0 - 44 U/L   Alkaline Phosphatase 117 38 - 126 U/L   Total Bilirubin 0.8 0.3 - 1.2 mg/dL   GFR, Estimated 23 (L) >60 mL/min    Comment: (NOTE) Calculated using the CKD-EPI Creatinine Equation (2021)    Anion gap 16 (H) 5 - 15    Comment: Performed at [facility], [place] [address] [place], [place] [address]  CBC     Status: Abnormal   Collection Time: 04/27/21 12:52 PM  Result Value Ref  Range   WBC 10.7 (H) 4.0 - 10.5 K/uL   RBC 5.41 (H) 3.87 - 5.11 MIL/uL   Hemoglobin 13.5 12.0 - 15.0 g/dL   HCT 44.7 36.0 - 46.0 %   MCV 82.6 80.0 - 100.0 fL   MCH 25.0 (L) 26.0 - 34.0 pg   MCHC 30.2 30.0 - 36.0 g/dL   RDW 15.3 11.5 - 15.5 %   Platelets 422 (H) 150 - 400 K/uL   nRBC 0.0 0.0 - 0.2 %    Comment: Performed at [facility], [place] [address], [place], [place] [address]  Troponin I (High Sensitivity)     Status: Abnormal   Collection Time: 04/27/21 12:52 PM  Result Value Ref Range   Troponin I (High Sensitivity) 24 (H) <18 ng/L    Comment: (NOTE) Elevated high sensitivity troponin I (hsTnI) values and significant  changes across serial measurements may suggest ACS but many other  chronic and acute conditions are known to elevate hsTnI results.  Refer to the "Links" section for chest pain algorithms and additional  guidance. Performed at [facility], [place] [address], [place], [place] [address]   Resp Panel by RT-PCR (Flu A&B, Covid) Nasopharyngeal Swab     Status: None   Collection Time: 04/27/21  2:11 PM   Specimen: Nasopharyngeal Swab; Nasopharyngeal(NP) swabs in vial transport medium  Result Value Ref Range   SARS Coronavirus 2 by RT PCR NEGATIVE NEGATIVE    Comment: (NOTE) SARS-CoV-2 target nucleic acids are NOT DETECTED.  The SARS-CoV-2 RNA is generally detectable in upper respiratory specimens during the acute phase of infection. The lowest concentration of SARS-CoV-2 viral copies this assay can detect is 138 copies/mL. A negative result does not preclude SARS-Cov-2 infection and should not be used as the sole basis for treatment or other patient management decisions. A negative result may occur with  improper specimen collection/handling, submission of specimen other than nasopharyngeal swab, presence of viral mutation(s) within the areas targeted by this assay, and inadequate number of viral copies(<138 copies/mL). A  negative result must be combined with clinical observations, patient history, and epidemiological information. The expected result is Negative.  Fact Sheet for Patients:  EntrepreneurPulse.com.au  Fact Sheet for Healthcare Providers:  IncredibleEmployment.be  This test is no t yet approved or cleared by the [place] FDA and  has been authorized for detection and/or diagnosis of SARS-CoV-2 by FDA under an Emergency Use Authorization (EUA). This EUA will remain  in effect (meaning this test can be used) for the duration of the COVID-19 declaration under Section 564(b)(1) of the Act, 21 U.S.C.section 360bbb-3(b)(1), unless the authorization is terminated  or revoked sooner.       Influenza  A by PCR NEGATIVE NEGATIVE   Influenza B by PCR NEGATIVE NEGATIVE    Comment: (NOTE) The Xpert Xpress SARS-CoV-2/FLU/RSV plus assay is intended as an aid in the diagnosis of influenza from Nasopharyngeal swab specimens and should not be used as a sole basis for treatment. Nasal washings and aspirates are unacceptable for Xpert Xpress SARS-CoV-2/FLU/RSV testing.  Fact Sheet for Patients: EntrepreneurPulse.com.au  Fact Sheet for Healthcare Providers: IncredibleEmployment.be  This test is not yet approved or cleared by the [place] FDA and has been authorized for detection and/or diagnosis of SARS-CoV-2 by FDA under an Emergency Use Authorization (EUA). This EUA will remain in effect (meaning this test can be used) for the duration of the COVID-19 declaration under Section 564(b)(1) of the Act, 21 U.S.C. section 360bbb-3(b)(1), unless the authorization is terminated or revoked.  Performed at [facility], [place] [address] [place], [place] [address]   Lactic acid, plasma     Status: Abnormal   Collection Time: 04/27/21  4:09 PM  Result Value Ref Range   Lactic Acid, Venous 2.2 (HH) 0.5 - 1.9  mmol/L    Comment: CRITICAL RESULT CALLED TO, READ BACK BY AND VERIFIED WITHArnoldo Lenis RN AT 1728 04/27/21 MULLINS,T Performed at [facility], [place] [address], [place], [place] [address]   CBG monitoring, ED     Status: Abnormal   Collection Time: 04/27/21  4:14 PM  Result Value Ref Range   Glucose-Capillary 107 (H) 70 - 99 mg/dL    Comment: Glucose reference range applies only to samples taken after fasting for at least 8 hours.  Lactic acid, plasma     Status: Abnormal   Collection Time: 04/27/21  6:19 PM  Result Value Ref Range   Lactic Acid, Venous 2.8 (HH) 0.5 - 1.9 mmol/L    Comment: CRITICAL VALUE NOTED.  VALUE IS CONSISTENT WITH PREVIOUSLY REPORTED AND CALLED VALUE. EDENSCA Performed at [facility], [place] [address], [place], [place] [address]   Troponin I (High Sensitivity)     Status: Abnormal   Collection Time: 04/27/21  6:38 PM  Result Value Ref Range   Troponin I (High Sensitivity) 53 (H) <18 ng/L    Comment: DELTA CHECK NOTED (NOTE) Elevated high sensitivity troponin I (hsTnI) values and significant  changes across serial measurements may suggest ACS but many other  chronic and acute conditions are known to elevate hsTnI results.  Refer to the Links section for chest pain algorithms and additional  guidance. Performed at [facility], [place] [address] [place], [place] [address]   Lactic acid, plasma     Status: None   Collection Time: 04/27/21  9:02 PM  Result Value Ref Range   Lactic Acid, Venous 1.6 0.5 - 1.9 mmol/L    Comment: Performed at [facility], [place] [address] [place], [place] [address]  CBC     Status: Abnormal   Collection Time: 04/28/21  5:43 AM  Result Value Ref Range   WBC 8.7 4.0 - 10.5 K/uL   RBC 4.04 3.87 - 5.11 MIL/uL   Hemoglobin 10.1 (L) 12.0 - 15.0 g/dL    Comment: REPEATED TO VERIFY DELTA CHECK NOTED    HCT 34.2 (L) 36.0 - 46.0 %   MCV 84.7 80.0 - 100.0 fL    MCH 25.0 (L) 26.0 - 34.0 pg   MCHC 29.5 (L) 30.0 - 36.0 g/dL   RDW 15.3 11.5 - 15.5 %   Platelets 256 150 -  400 K/uL   nRBC 0.0 0.0 - 0.2 %    Comment: Performed at [facility], [place] [address], [place], [place] [address]  Basic metabolic panel     Status: Abnormal   Collection Time: 04/28/21  5:43 AM  Result Value Ref Range   Sodium 143 135 - 145 mmol/L   Potassium 4.3 3.5 - 5.1 mmol/L   Chloride 114 (H) 98 - 111 mmol/L   CO2 20 (L) 22 - 32 mmol/L   Glucose, Bld 89 70 - 99 mg/dL    Comment: Glucose reference range applies only to samples taken after fasting for at least 8 hours.   BUN 31 (H) 8 - 23 mg/dL   Creatinine, Ser 1.86 (H) 0.44 - 1.00 mg/dL   Calcium 8.8 (L) 8.9 - 10.3 mg/dL   GFR, Estimated 28 (L) >60 mL/min    Comment: (NOTE) Calculated using the CKD-EPI Creatinine Equation (2021)    Anion gap 9 5 - 15    Comment: Performed at [facility], [place] [address] [place], [place] [address]  Troponin I (High Sensitivity)     Status: Abnormal   Collection Time: 04/28/21  5:43 AM  Result Value Ref Range   Troponin I (High Sensitivity) 55 (H) <18 ng/L    Comment: (NOTE) Elevated high sensitivity troponin I (hsTnI) values and significant  changes across serial measurements may suggest ACS but many other  chronic and acute conditions are known to elevate hsTnI results.  Refer to the "Links" section for chest pain algorithms and additional  guidance. Performed at [facility], [place] [address] [place] [address], [place], [place] [address]   Troponin I (High Sensitivity)     Status: Abnormal   Collection Time: 04/28/21  2:35 PM  Result Value Ref Range   Troponin I (High Sensitivity) 38 (H) <18 ng/L    Comment: (NOTE) Elevated high sensitivity troponin I (hsTnI) values and significant  changes across serial measurements may suggest ACS but many other  chronic and acute conditions are known to elevate hsTnI results.  Refer to the "Links"  section for chest pain algorithms and additional  guidance. Performed at [facility], [place] [address], [place], [place] [address]     CT ABDOMEN PELVIS WO CONTRAST  Result Date: 04/27/2021 CLINICAL DATA:  Abdominal pain, nausea and vomiting. EXAM: CT ABDOMEN AND PELVIS WITHOUT CONTRAST TECHNIQUE: Multidetector CT imaging of the abdomen and pelvis was performed following the standard protocol without IV contrast. COMPARISON:  01/12/2021 FINDINGS: Lower chest: Stable moderate to large sized hiatal hernia. The heart is normal in size. No pericardial effusion. Stable aortic and coronary artery calcifications. No pleural effusions or pulmonary infiltrates. Hepatobiliary: No hepatic lesions or intrahepatic biliary dilatation. Layering higher attenuation material in the dependent portion the gallbladder likely gallbladder sludge. No findings to suggest acute cholecystitis. No common bile duct dilatation. Pancreas: No mass, inflammation or ductal dilatation. Spleen: Normal size. No focal lesions. Adrenals/Urinary Tract: Adrenal glands are unremarkable. Chronic left renal scarring changes and left renal cysts. No worrisome renal lesions or hydronephrosis. No obstructing ureteral calculi. No bladder calculi or bladder mass. There is a small amount of gas in the bladder which could be due to recent catheterization. Stomach/Bowel: The stomach, duodenum, small bowel and colon are grossly normal. No acute inflammatory changes, mass lesions or obstructive findings. The terminal ileum is normal. The appendix is normal. Descending and sigmoid colon diverticulosis but no findings to suggest acute diverticulitis. Vascular/Lymphatic: Stable atherosclerotic calcifications  involving the aorta and iliac arteries but no aneurysm. No mesenteric or retroperitoneal mass or adenopathy. Reproductive: Surgically absent. Other: Small periumbilical abdominal wall hernia containing fat and a small bowel loop but no  findings for incarceration or obstruction. Musculoskeletal: Stable degenerative changes involving the spine and hips. No acute bony findings or bone lesions. IMPRESSION: 1. No acute abdominal/pelvic findings, mass lesions or adenopathy. 2. Stable moderate to large sized hiatal hernia. 3. Layering higher attenuation material in the gallbladder likely gallbladder sludge. No findings to suggest acute cholecystitis. 4. Chronic left renal scarring changes and left renal cysts. 5. Small periumbilical abdominal wall hernia containing fat and a small bowel loop but no findings for incarceration or obstruction. Aortic Atherosclerosis (ICD10-I70.0). Electronically Signed   By: Marijo Sanes M.D.   On: 04/27/2021 16:25   CT HEAD WO CONTRAST (5MM)  Result Date: 04/27/2021 CLINICAL DATA:  70 year old female with history of altered mental status. Confusion. Headache. EXAM: CT HEAD WITHOUT CONTRAST TECHNIQUE: Contiguous axial images were obtained from the base of the skull through the vertex without intravenous contrast. COMPARISON:  Head CT 01/12/2021. FINDINGS: Brain: Patchy and confluent areas of decreased attenuation are noted throughout the deep and periventricular white matter of the cerebral hemispheres bilaterally, compatible with chronic microvascular ischemic disease. No evidence of acute infarction, hemorrhage, hydrocephalus, extra-axial collection or mass lesion/mass effect. Vascular: No hyperdense vessel or unexpected calcification. Skull: Normal. Negative for fracture or focal lesion. Sinuses/Orbits: No acute finding. Other: None. IMPRESSION: 1. No acute intracranial abnormalities. 2. Chronic microvascular ischemic changes in the cerebral white matter, similar to the prior study, as above. Electronically Signed   By: Vinnie Langton M.D.   On: 04/27/2021 17:21   ECHOCARDIOGRAM COMPLETE  Result Date: 04/28/2021    ECHOCARDIOGRAM REPORT   Patient Name:   Tamara Friedman Date of Exam: 04/28/2021 Medical Rec #:   [id number]      Height:       66.0 in Accession #:    [id number]     Weight:       160.0 lb Date of Birth:  July 12, 1951      BSA:          1.819 m Patient Age:    4 years       BP:           174/84 mmHg Patient Gender: F              HR:           76 bpm. Exam Location:  Inpatient Procedure: 2D Echo, Cardiac Doppler and Color Doppler Indications:    R94.31 Abnormal EKG  History:        Patient has no prior history of Echocardiogram examinations.                 Risk Factors:Hypertension.  Sonographer:    Bernadene Person RDCS Referring Phys: [place]  1. Left ventricular ejection fraction, by estimation, is 60 to 65%. The left ventricle has normal function. The left ventricle has no regional wall motion abnormalities. Left ventricular diastolic parameters were normal.  2. Right ventricular systolic function is normal. The right ventricular size is normal. There is normal pulmonary artery systolic pressure.  3. The mitral valve is normal in structure. Trivial mitral valve regurgitation. No evidence of mitral stenosis.  4. The aortic valve is tricuspid. Aortic valve regurgitation is not visualized. Mild to moderate aortic valve sclerosis/calcification is present, without any evidence of aortic stenosis.  5. The inferior vena cava is normal in size with greater than 50% respiratory variability, suggesting right atrial pressure of 3 mmHg. FINDINGS  Left Ventricle: Left ventricular ejection fraction, by estimation, is 60 to 65%. The left ventricle has normal function. The left ventricle has no regional wall motion abnormalities. The left ventricular internal cavity size was normal in size. There is  no left ventricular hypertrophy. Left ventricular diastolic parameters were normal. Right Ventricle: The right ventricular size is normal. No increase in right ventricular wall thickness. Right ventricular systolic function is normal. There is normal pulmonary artery systolic pressure. The tricuspid  regurgitant velocity is 2.38 m/s, and  with an assumed right atrial pressure of 3 mmHg, the estimated right ventricular systolic pressure is 14.4 mmHg. Left Atrium: Left atrial size was normal in size. Right Atrium: Right atrial size was normal in size. Pericardium: There is no evidence of pericardial effusion. Mitral Valve: The mitral valve is normal in structure. Trivial mitral valve regurgitation. No evidence of mitral valve stenosis. Tricuspid Valve: The tricuspid valve is normal in structure. Tricuspid valve regurgitation is mild . No evidence of tricuspid stenosis. Aortic Valve: The aortic valve is tricuspid. Aortic valve regurgitation is not visualized. Mild to moderate aortic valve sclerosis/calcification is present, without any evidence of aortic stenosis. Pulmonic Valve: The pulmonic valve was normal in structure. Pulmonic valve regurgitation is not visualized. No evidence of pulmonic stenosis. Aorta: The aortic root is normal in size and structure. Venous: The inferior vena cava is normal in size with greater than 50% respiratory variability, suggesting right atrial pressure of 3 mmHg. IAS/Shunts: No atrial level shunt detected by color flow Doppler.  LEFT VENTRICLE PLAX 2D LVIDd:         4.70 cm  Diastology LVIDs:         2.80 cm  LV e' medial:    7.73 cm/s LV PW:         1.00 cm  LV E/e' medial:  9.9 LV IVS:        0.80 cm  LV e' lateral:   10.80 cm/s LVOT diam:     2.10 cm  LV E/e' lateral: 7.1 LV SV:         68 LV SV Index:   38 LVOT Area:     3.46 cm  RIGHT VENTRICLE RV S prime:     12.60 cm/s TAPSE (M-mode): 1.9 cm LEFT ATRIUM             Index       RIGHT ATRIUM           Index LA diam:        3.40 cm 1.87 cm/m  RA Area:     16.20 cm LA Vol (A2C):   45.8 ml 25.18 ml/m RA Volume:   43.10 ml  23.70 ml/m LA Vol (A4C):   39.2 ml 21.55 ml/m LA Biplane Vol: 46.5 ml 25.57 ml/m  AORTIC VALVE LVOT Vmax:   106.00 cm/s LVOT Vmean:  71.100 cm/s LVOT VTI:    0.197 m  AORTA Ao Root diam: 3.30 cm Ao Asc  diam:  3.40 cm MITRAL VALVE               TRICUSPID VALVE MV Area (PHT): 3.68 cm    TR Peak grad:   22.7 mmHg MV Decel Time: 206 msec    TR Vmax:        238.00 cm/s MV E velocity: 76.20 cm/s MV A velocity: 82.00 cm/s  SHUNTS MV E/A ratio:  0.93        Systemic VTI:  0.20 m                            Systemic Diam: 2.10 cm Jenkins Rouge MD Electronically signed by Jenkins Rouge MD Signature Date/Time: 04/28/2021/1:47:14 PM    Final     Review of Systems  Constitutional:  Positive for chills. Negative for fever.  HENT:  Negative for sore throat.   Eyes:  Negative for discharge.  Respiratory:  Negative for cough and shortness of breath.   Cardiovascular:  Negative for chest pain and leg swelling.  Genitourinary:  Negative for difficulty urinating and dysuria.  Neurological:  Negative for dizziness.  Blood pressure 130/74, pulse 70, temperature 97.9 F (36.6 C), resp. rate 17, SpO2 98 %. Physical Exam Constitutional:      Appearance: Normal appearance.  HENT:     Head: Normocephalic and atraumatic.  Eyes:     Extraocular Movements: Extraocular movements intact.     Conjunctiva/sclera: Conjunctivae normal.     Pupils: Pupils are equal, round, and reactive to light.  Cardiovascular:     Rate and Rhythm: Normal rate.     Heart sounds: Murmur (2/6 systolic murmur noted) heard.  Pulmonary:     Effort: Pulmonary effort is normal.     Breath sounds: Normal breath sounds.  Abdominal:     General: Abdomen is flat. There is no distension.     Palpations: Abdomen is soft.     Tenderness: There is no abdominal tenderness.  Musculoskeletal:        General: No swelling, tenderness or deformity.     Cervical back: Normal range of motion and neck supple. No rigidity or tenderness.     Right lower leg: No edema.     Left lower leg: No edema.  Skin:    General: Skin is warm and dry.  Neurological:     General: No focal deficit present.     Mental Status: She is alert and oriented to person, place,  and time.    Assessment/Plan: Status post hypertensive urgency secondary to inability to keep medications in due to intractable nausea vomiting Status post intractable nausea vomiting probably secondary to food poisoning Minimally elevated high-sensitivity troponin I secondary to above doubt significant MI Hypertension Hyperlipidemia Acute on chronic kidney injury improved Degenerative joint disease Plan Agree with present management Agree with holding ACE inhibitor for now Check renal function in a.m. Okay to discharge from cardiac point of view Follow-up with me in 1 to 2 weeks    Tamara Friedman 04/28/2021, 4:57 PM

## 2021-04-28 NOTE — Assessment & Plan Note (Addendum)
-   Patient endorsed compliance with BP regimen at home however upon resuming during hospitalization her blood pressure became significantly low (90s/50s).  When some medications were held, her blood pressure improved back to normal (131/73) and again when they were resumed she again had hypotension (84/62).  Therefore discharge her amlodipine is discontinued, clonidine is reduced to 0.1 mg 3 times daily, and lisinopril changed from twice daily dosing to daily dosing - follow up BP response at follow up

## 2021-04-28 NOTE — Progress Notes (Signed)
  Echocardiogram 2D Echocardiogram has been performed.  Tamara Friedman 04/28/2021, 1:45 PM

## 2021-04-28 NOTE — Assessment & Plan Note (Addendum)
-  patient has history of CKD4. Baseline creat ~ 1.8, eGFR 23-28 - patient presents with increase in creat >0.3 mg/dL above baseline, creat increase >1.5x baseline presumed to have occurred within past 7 days PTA - creatinine 2.18 on admission - presumed pre-renal from volume depletion in setting of N/V - s/p IVF with good response - repeat BMP at follow up

## 2021-04-29 ENCOUNTER — Encounter (HOSPITAL_COMMUNITY): Payer: Self-pay | Admitting: Family Medicine

## 2021-04-29 DIAGNOSIS — N184 Chronic kidney disease, stage 4 (severe): Secondary | ICD-10-CM | POA: Diagnosis not present

## 2021-04-29 DIAGNOSIS — R778 Other specified abnormalities of plasma proteins: Secondary | ICD-10-CM | POA: Diagnosis not present

## 2021-04-29 DIAGNOSIS — I10 Essential (primary) hypertension: Secondary | ICD-10-CM

## 2021-04-29 DIAGNOSIS — I160 Hypertensive urgency: Secondary | ICD-10-CM | POA: Diagnosis not present

## 2021-04-29 DIAGNOSIS — N179 Acute kidney failure, unspecified: Secondary | ICD-10-CM | POA: Diagnosis not present

## 2021-04-29 DIAGNOSIS — R112 Nausea with vomiting, unspecified: Secondary | ICD-10-CM

## 2021-04-29 LAB — BASIC METABOLIC PANEL
Anion gap: 6 (ref 5–15)
BUN: 30 mg/dL — ABNORMAL HIGH (ref 8–23)
CO2: 23 mmol/L (ref 22–32)
Calcium: 8.3 mg/dL — ABNORMAL LOW (ref 8.9–10.3)
Chloride: 108 mmol/L (ref 98–111)
Creatinine, Ser: 2.12 mg/dL — ABNORMAL HIGH (ref 0.44–1.00)
GFR, Estimated: 24 mL/min — ABNORMAL LOW (ref >60)
Glucose, Bld: 88 mg/dL (ref 70–99)
Potassium: 3.7 mmol/L (ref 3.5–5.1)
Sodium: 137 mmol/L (ref 135–145)

## 2021-04-29 MED ORDER — ACETAMINOPHEN 325 MG PO TABS
650.0000 mg | ORAL_TABLET | ORAL | Status: DC | PRN
  Administered 2021-04-29: 650 mg via ORAL
  Filled 2021-04-29: qty 2

## 2021-04-29 MED ORDER — CLONIDINE HCL 0.1 MG PO TABS: 0.1000 mg | ORAL_TABLET | Freq: Three times a day (TID) | ORAL | 3 refills | Status: DC

## 2021-04-29 MED ORDER — PROCHLORPERAZINE EDISYLATE 10 MG/2ML IJ SOLN: 10.0000 mg | Freq: Four times a day (QID) | INTRAMUSCULAR | Status: DC | PRN

## 2021-04-29 MED ORDER — ACETAMINOPHEN 325 MG PO TABS
650.0000 mg | ORAL_TABLET | Freq: Four times a day (QID) | ORAL | Status: DC | PRN
Start: 2021-04-29 — End: 2021-04-29
  Administered 2021-04-29: 650 mg via ORAL
  Filled 2021-04-29: qty 2

## 2021-04-29 MED ORDER — LISINOPRIL 40 MG PO TABS: 40.0000 mg | ORAL_TABLET | Freq: Every day | ORAL | 3 refills | Status: DC

## 2021-04-29 NOTE — Discharge Summary (Signed)
Physician Discharge Summary   Patient name: Tamara Friedman  Admit date:     04/27/2021  Discharge date: 04/29/2021  Attending Physician: Orene Desanctis [[id number]]  Discharge Physician: [name]   PCP: Lucianne Lei, MD    Follow-up Information     Charolette Forward, MD. Schedule an appointment as soon as possible for a visit in 1 week(s).   Specialty: Cardiology Contact information: [place] [address] [place], [facility] Follow up.   Specialty: [place] Why: They will contact you about setting up a time to come out to your home Contact information: [place] [address] [phone]                 Recommendations at discharge:  Follow up BP response; adjust meds further Repeat BMP  Discharge Diagnoses Active Problems:   Acute renal failure superimposed on stage 4 chronic kidney disease (HCC)   Elevated troponin   HTN (hypertension)   Chronic CHF ([place])   Hypertensive urgency   Resolved Diagnoses Resolved Problems:   Intractable vomiting with nausea   SIRS (systemic inflammatory response syndrome) [facility])   Hospital Course   Tamara Friedman is a 70 yo female with PMH HTN, HLD, CHF, osteoarthritis who presented to the ER with N/V.  She had started becoming sick on Sunday prior to admission.  After developing nausea and vomiting she then had some abdominal pain complaints.  Due to her symptoms she had not been able to take her home medications including her blood pressure medications. On arrival she was found to be in hypertensive urgency, 205/143 and tachycardic.  She was treated with antiemetics and IV Benadryl. After treatment, she became lethargic and was sent for CT head which showed no acute abnormalities with underlying chronic microvascular ischemic changes. In regards to her abdominal pain, she underwent CT abdomen/pelvis.  There were no acute findings to explain  her pain complaints.  With further work-up, she was found to have an indeterminate troponin which further up trended.  EKG also concerning for LVH and ST depression in inferior leads initially.  She had no urinary complaints but urinalysis showed some bacteria however was negative for nitrites and leukocyte esterase.  She was started on Rocephin on admission.  This was discontinued the morning following admission.  See below for further A&P.    Elevated troponin - Differential includes troponin leak from hypertensive urgency on admission versus ACS with atypical symptoms given female -EKG repeated this morning, now with T wave inversions noted in V2 and V3, not present on prior EKG yesterday - Troponin peaked at 55 then started downtrending - evaluated by Dr. Terrence Dupont inpatient as well; troponin leak not felt to be cardiac in origin; likely from uncontrolled BP on admission  - echo ordered: EF 60-65%, no RWMA, normal diastology   Acute renal failure superimposed on stage 4 chronic kidney disease ([place]) - patient has history of CKD4. Baseline creat ~ 1.8, eGFR 23-28 - patient presents with increase in creat >0.3 mg/dL above baseline, creat increase >1.5x baseline presumed to have occurred within past 7 days PTA - creatinine 2.18 on admission - presumed pre-renal from volume depletion in setting of N/V - s/p IVF with good response - repeat BMP at follow up   Hypertensive urgency - Considered due to inability to tolerate home medications in setting of recent nausea and vomiting - Currently symptoms  have improved and home meds being resumed  Chronic CHF ([place]) - no s/s exacerbation   HTN (hypertension) - Patient endorsed compliance with BP regimen at home however upon resuming during hospitalization her blood pressure became significantly low (90s/50s).  When some medications were held, her blood pressure improved back to normal (131/73) and again when they were resumed she again had  hypotension (84/62).  Therefore discharge her amlodipine is discontinued, clonidine is reduced to 0.1 mg 3 times daily, and lisinopril changed from twice daily dosing to daily dosing - follow up BP response at follow up   Intractable vomiting with nausea-resolved as of 04/29/2021 - differential includes enteritis vs atypical cardiac etiology (echo reassuring), she likely has some viral illness from recent cookout and should continue to improve   SIRS (systemic inflammatory response syndrome) (HCC)-resolved as of 04/28/2021 - sepsis ruled out - patient has no urinary sxms. UA not concerning for infection with her clinical presentation - d/c abx     Procedures performed:    Condition at discharge: stable  Exam  General appearance: alert, cooperative, and no distress Head: Normocephalic, without obvious abnormality, atraumatic Eyes:  EOMI Lungs: clear to auscultation bilaterally Heart: regular rate and rhythm and S1, S2 normal Abdomen: normal findings: bowel sounds normal and soft, non-tender Extremities:  no edema Skin: mobility and turgor normal Neurologic: Grossly normal  Disposition: Home  Discharge time: greater than 30 minutes. Allergies as of 04/29/2021   No Known Allergies      Medication List     STOP taking these medications    amLODipine 10 MG tablet Commonly known as: NORVASC   dicyclomine 20 MG tablet Commonly known as: BENTYL   ondansetron 4 MG disintegrating tablet Commonly known as: Zofran ODT       TAKE these medications    aspirin 81 MG tablet Take 81 mg by mouth daily.   atorvastatin 10 MG tablet Commonly known as: LIPITOR Take 10 mg by mouth daily.   carvedilol 25 MG tablet Commonly known as: COREG Take 25 mg by mouth 2 (two) times daily with a meal.   cloNIDine 0.1 MG tablet Commonly known as: CATAPRES Take 1 tablet (0.1 mg total) by mouth 3 (three) times daily. What changed:  medication strength how much to take   ibuprofen 200  MG tablet Commonly known as: ADVIL Take 400 mg by mouth every 6 (six) hours as needed for moderate pain.   lisinopril 40 MG tablet Commonly known as: ZESTRIL Take 1 tablet (40 mg total) by mouth daily. What changed: when to take this               Durable Medical Equipment  (From admission, onward)           Start     Ordered   04/29/21 1153  For home use only DME Walker rolling  Once       Question Answer Comment  Walker: With 5 Inch Wheels   Patient needs a walker to treat with the following condition Generalized muscle weakness      04/29/21 1152            CT ABDOMEN PELVIS WO CONTRAST  Result Date: 04/27/2021 CLINICAL DATA:  Abdominal pain, nausea and vomiting. EXAM: CT ABDOMEN AND PELVIS WITHOUT CONTRAST TECHNIQUE: Multidetector CT imaging of the abdomen and pelvis was performed following the standard protocol without IV contrast. COMPARISON:  01/12/2021 FINDINGS: Lower chest: Stable moderate to large sized hiatal hernia. The heart is normal in  size. No pericardial effusion. Stable aortic and coronary artery calcifications. No pleural effusions or pulmonary infiltrates. Hepatobiliary: No hepatic lesions or intrahepatic biliary dilatation. Layering higher attenuation material in the dependent portion the gallbladder likely gallbladder sludge. No findings to suggest acute cholecystitis. No common bile duct dilatation. Pancreas: No mass, inflammation or ductal dilatation. Spleen: Normal size. No focal lesions. Adrenals/Urinary Tract: Adrenal glands are unremarkable. Chronic left renal scarring changes and left renal cysts. No worrisome renal lesions or hydronephrosis. No obstructing ureteral calculi. No bladder calculi or bladder mass. There is a small amount of gas in the bladder which could be due to recent catheterization. Stomach/Bowel: The stomach, duodenum, small bowel and colon are grossly normal. No acute inflammatory changes, mass lesions or obstructive findings.  The terminal ileum is normal. The appendix is normal. Descending and sigmoid colon diverticulosis but no findings to suggest acute diverticulitis. Vascular/Lymphatic: Stable atherosclerotic calcifications involving the aorta and iliac arteries but no aneurysm. No mesenteric or retroperitoneal mass or adenopathy. Reproductive: Surgically absent. Other: Small periumbilical abdominal wall hernia containing fat and a small bowel loop but no findings for incarceration or obstruction. Musculoskeletal: Stable degenerative changes involving the spine and hips. No acute bony findings or bone lesions. IMPRESSION: 1. No acute abdominal/pelvic findings, mass lesions or adenopathy. 2. Stable moderate to large sized hiatal hernia. 3. Layering higher attenuation material in the gallbladder likely gallbladder sludge. No findings to suggest acute cholecystitis. 4. Chronic left renal scarring changes and left renal cysts. 5. Small periumbilical abdominal wall hernia containing fat and a small bowel loop but no findings for incarceration or obstruction. Aortic Atherosclerosis (ICD10-I70.0). Electronically Signed   By: Marijo Sanes M.D.   On: 04/27/2021 16:25   CT HEAD WO CONTRAST (5MM)  Result Date: 04/27/2021 CLINICAL DATA:  70 year old female with history of altered mental status. Confusion. Headache. EXAM: CT HEAD WITHOUT CONTRAST TECHNIQUE: Contiguous axial images were obtained from the base of the skull through the vertex without intravenous contrast. COMPARISON:  Head CT 01/12/2021. FINDINGS: Brain: Patchy and confluent areas of decreased attenuation are noted throughout the deep and periventricular white matter of the cerebral hemispheres bilaterally, compatible with chronic microvascular ischemic disease. No evidence of acute infarction, hemorrhage, hydrocephalus, extra-axial collection or mass lesion/mass effect. Vascular: No hyperdense vessel or unexpected calcification. Skull: Normal. Negative for fracture or focal  lesion. Sinuses/Orbits: No acute finding. Other: None. IMPRESSION: 1. No acute intracranial abnormalities. 2. Chronic microvascular ischemic changes in the cerebral white matter, similar to the prior study, as above. Electronically Signed   By: Vinnie Langton M.D.   On: 04/27/2021 17:21   ECHOCARDIOGRAM COMPLETE  Result Date: 04/28/2021    ECHOCARDIOGRAM REPORT   Patient Name:   Tamara Friedman Date of Exam: 04/28/2021 Medical Rec #:  [id number]      Height:       66.0 in Accession #:    [id number]     Weight:       160.0 lb Date of Birth:  [birth year]      BSA:          1.819 m Patient Age:    78 years       BP:           174/84 mmHg Patient Gender: F              HR:           76 bpm. Exam Location:  Inpatient Procedure: 2D Echo, Cardiac Doppler and Color Doppler Indications:  R94.31 Abnormal EKG  History:        Patient has no prior history of Echocardiogram examinations.                 Risk Factors:Hypertension.  Sonographer:    Bernadene Person RDCS Referring Phys: Day  1. Left ventricular ejection fraction, by estimation, is 60 to 65%. The left ventricle has normal function. The left ventricle has no regional wall motion abnormalities. Left ventricular diastolic parameters were normal.  2. Right ventricular systolic function is normal. The right ventricular size is normal. There is normal pulmonary artery systolic pressure.  3. The mitral valve is normal in structure. Trivial mitral valve regurgitation. No evidence of mitral stenosis.  4. The aortic valve is tricuspid. Aortic valve regurgitation is not visualized. Mild to moderate aortic valve sclerosis/calcification is present, without any evidence of aortic stenosis.  5. The inferior vena cava is normal in size with greater than 50% respiratory variability, suggesting right atrial pressure of 3 mmHg. FINDINGS  Left Ventricle: Left ventricular ejection fraction, by estimation, is 60 to 65%. The left ventricle has normal function.  The left ventricle has no regional wall motion abnormalities. The left ventricular internal cavity size was normal in size. There is  no left ventricular hypertrophy. Left ventricular diastolic parameters were normal. Right Ventricle: The right ventricular size is normal. No increase in right ventricular wall thickness. Right ventricular systolic function is normal. There is normal pulmonary artery systolic pressure. The tricuspid regurgitant velocity is 2.38 m/s, and  with an assumed right atrial pressure of 3 mmHg, the estimated right ventricular systolic pressure is 69.6 mmHg. Left Atrium: Left atrial size was normal in size. Right Atrium: Right atrial size was normal in size. Pericardium: There is no evidence of pericardial effusion. Mitral Valve: The mitral valve is normal in structure. Trivial mitral valve regurgitation. No evidence of mitral valve stenosis. Tricuspid Valve: The tricuspid valve is normal in structure. Tricuspid valve regurgitation is mild . No evidence of tricuspid stenosis. Aortic Valve: The aortic valve is tricuspid. Aortic valve regurgitation is not visualized. Mild to moderate aortic valve sclerosis/calcification is present, without any evidence of aortic stenosis. Pulmonic Valve: The pulmonic valve was normal in structure. Pulmonic valve regurgitation is not visualized. No evidence of pulmonic stenosis. Aorta: The aortic root is normal in size and structure. Venous: The inferior vena cava is normal in size with greater than 50% respiratory variability, suggesting right atrial pressure of 3 mmHg. IAS/Shunts: No atrial level shunt detected by color flow Doppler.  LEFT VENTRICLE PLAX 2D LVIDd:         4.70 cm  Diastology LVIDs:         2.80 cm  LV e' medial:    7.73 cm/s LV PW:         1.00 cm  LV E/e' medial:  9.9 LV IVS:        0.80 cm  LV e' lateral:   10.80 cm/s LVOT diam:     2.10 cm  LV E/e' lateral: 7.1 LV SV:         68 LV SV Index:   38 LVOT Area:     3.46 cm  RIGHT VENTRICLE RV S  prime:     12.60 cm/s TAPSE (M-mode): 1.9 cm LEFT ATRIUM             Index       RIGHT ATRIUM           Index  LA diam:        3.40 cm 1.87 cm/m  RA Area:     16.20 cm LA Vol (A2C):   45.8 ml 25.18 ml/m RA Volume:   43.10 ml  23.70 ml/m LA Vol (A4C):   39.2 ml 21.55 ml/m LA Biplane Vol: 46.5 ml 25.57 ml/m  AORTIC VALVE LVOT Vmax:   106.00 cm/s LVOT Vmean:  71.100 cm/s LVOT VTI:    0.197 m  AORTA Ao Root diam: 3.30 cm Ao Asc diam:  3.40 cm MITRAL VALVE               TRICUSPID VALVE MV Area (PHT): 3.68 cm    TR Peak grad:   22.7 mmHg MV Decel Time: 206 msec    TR Vmax:        238.00 cm/s MV E velocity: 76.20 cm/s MV A velocity: 82.00 cm/s  SHUNTS MV E/A ratio:  0.93        Systemic VTI:  0.20 m                            Systemic Diam: 2.10 cm Jenkins Rouge MD Electronically signed by Jenkins Rouge MD Signature Date/Time: 04/28/2021/1:47:14 PM    Final    Results for orders placed or performed during the hospital encounter of 04/27/21  Resp Panel by RT-PCR (Flu A&B, Covid) Nasopharyngeal Swab     Status: None   Collection Time: 04/27/21  2:11 PM   Specimen: Nasopharyngeal Swab; Nasopharyngeal(NP) swabs in vial transport medium  Result Value Ref Range Status   SARS Coronavirus 2 by RT PCR NEGATIVE NEGATIVE Final    Comment: (NOTE) SARS-CoV-2 target nucleic acids are NOT DETECTED.  The SARS-CoV-2 RNA is generally detectable in upper respiratory specimens during the acute phase of infection. The lowest concentration of SARS-CoV-2 viral copies this assay can detect is 138 copies/mL. A negative result does not preclude SARS-Cov-2 infection and should not be used as the sole basis for treatment or other patient management decisions. A negative result may occur with  improper specimen collection/handling, submission of specimen other than nasopharyngeal swab, presence of viral mutation(s) within the areas targeted by this assay, and inadequate number of viral copies(<138 copies/mL). A negative result  must be combined with clinical observations, patient history, and epidemiological information. The expected result is Negative.  Fact Sheet for Patients:  EntrepreneurPulse.com.au  Fact Sheet for Healthcare Providers:  IncredibleEmployment.be  This test is no t yet approved or cleared by the [place] FDA and  has been authorized for detection and/or diagnosis of SARS-CoV-2 by FDA under an Emergency Use Authorization (EUA). This EUA will remain  in effect (meaning this test can be used) for the duration of the COVID-19 declaration under Section 564(b)(1) of the Act, 21 U.S.C.section 360bbb-3(b)(1), unless the authorization is terminated  or revoked sooner.       Influenza A by PCR NEGATIVE NEGATIVE Final   Influenza B by PCR NEGATIVE NEGATIVE Final    Comment: (NOTE) The Xpert Xpress SARS-CoV-2/FLU/RSV plus assay is intended as an aid in the diagnosis of influenza from Nasopharyngeal swab specimens and should not be used as a sole basis for treatment. Nasal washings and aspirates are unacceptable for Xpert Xpress SARS-CoV-2/FLU/RSV testing.  Fact Sheet for Patients: EntrepreneurPulse.com.au  Fact Sheet for Healthcare Providers: IncredibleEmployment.be  This test is not yet approved or cleared by the [place] FDA and has been authorized for detection and/or diagnosis of  SARS-CoV-2 by FDA under an Emergency Use Authorization (EUA). This EUA will remain in effect (meaning this test can be used) for the duration of the COVID-19 declaration under Section 564(b)(1) of the Act, 21 U.S.C. section 360bbb-3(b)(1), unless the authorization is terminated or revoked.  Performed at [facility], [place] [address], [place], [place] [address]     Signed:  [name] MD.  [facility] 04/29/2021, 3:55 PM

## 2021-04-29 NOTE — Progress Notes (Signed)
MD Girguis notified of pt BP 84/62 @ 1214. Pt asymptomatic at this time. BP taken at resting state.

## 2021-04-29 NOTE — Evaluation (Signed)
Physical Therapy Evaluation Patient Details Name: Tamara Friedman MRN: [id number] DOB: 09-25-1950 Today's Date: 04/29/2021     Clinical Impression  Tamara Friedman is 70 y.o. female admitted with above HPI and diagnosis. Patient is currently limited by functional impairments below (see PT problem list). Patient lives with her grandson and is independence at baseline. She currently is mobilizing at min guard/supervision level with RW for transfers and gait.  Patient will benefit from continued skilled PT interventions to address impairments and progress independence with mobility, recommending HHPT. Acute PT will follow and progress as able.        Recommendations for follow up therapy are one component of a multi-disciplinary discharge planning process, led by the attending physician.  Recommendations may be updated based on patient status, additional functional criteria and insurance authorization.  Follow Up Recommendations HHPT     Equipment Recommendations    RW   Recommendations for Other Services       Precautions / Restrictions   none     04/29/21 1145  PT Visit Information  Last PT Received On 04/29/21  Assistance Needed +1  History of Present Illness [name] is a 70 yo female with PMH HTN, HLD, CHF, osteoarthritis who presented to the ER with N/V.  She had started becoming sick on Sunday prior to admission.  After developing nausea and vomiting she then had some abdominal pain complaints.  Due to her symptoms she had not been able to take her home medications including her blood pressure medications. Pt HTN and tachycardic in ED. EKG also concerning for LVH and ST depression in inferior leads initially. Cardiology suspects from uncontroled elevated BP.  Precautions  Precautions Fall  Restrictions  Weight Bearing Restrictions No  Home Living  Family/patient expects to be discharged to: Private residence  Living Arrangements Alone;Other relatives (grandson)  Available Help  at Discharge Family;Available PRN/intermittently (pt's grandson works until 5 pm daily)  Type of [place] to enter  [employer] of Steps 1  Entrance Stairs-Rails None  Home Layout One level  Bathroom Shower/Tub Walk-in shower  Bathroom Toilet Handicapped height  Bathroom Accessibility Yes  Home Equipment None  Prior Function  Level of Independence Independent  Comments pt denies falls, reports independent prior to getting sicko n Saturday  Communication  Communication No difficulties  Pain Assessment  Pain Assessment No/denies pain  Cognition  Arousal/Alertness Awake/alert  Behavior During Therapy WFL for tasks assessed/performed  Overall Cognitive Status Within Functional Limits for tasks assessed  General Comments pt distracted by TV at times and avoiding eye contact, overall pleasant and expressing concerns for regaining her strength  Upper Extremity Assessment  Upper Extremity Assessment Overall WFL for tasks assessed  Lower Extremity Assessment  Lower Extremity Assessment Generalized weakness  Cervical / Trunk Assessment  Cervical / Trunk Assessment Normal  Bed Mobility  General bed mobility comments pt OOB in recliner  Transfers  Overall transfer level Needs assistance  Equipment used Rolling walker (2 wheeled)  Transfers Sit to/from Stand  Sit to Stand Min guard  General transfer comment cues for hand placement and safe technique to power up and reach back to recliner.  Ambulation/Gait  Ambulation/Gait assistance Min guard  Gait Distance (Feet) 160 Feet  Assistive device Rolling walker (2 wheeled)  Gait Pattern/deviations Step-through pattern;Decreased step length - right;Decreased step length - left;Decreased stride length (decreased arm swing)  General Gait Details overall pt with short shuffled steps and decreased arm swing limiting balance.  Pt pace slwoer with RW initially and then improved over further distance and with cues.   Gait velocity decr  Balance  Overall balance assessment Needs assistance  Sitting-balance support Feet supported  Sitting balance-Leahy Scale Good  Standing balance support During functional activity;Bilateral upper extremity supported;No upper extremity supported  Standing balance-Leahy Scale Fair  PT - End of Session  Equipment Utilized During Treatment Gait belt  Activity Tolerance Patient tolerated treatment well  Patient left in chair;with call bell/phone within reach;with chair alarm set  Nurse Communication Mobility status  PT Assessment  PT Recommendation/Assessment Patient needs continued PT services  PT Visit Diagnosis Muscle weakness (generalized) (M62.81);Difficulty in walking, not elsewhere classified (R26.2)  PT Problem List Decreased strength;Decreased balance;Decreased mobility;Decreased activity tolerance;Decreased knowledge of use of DME;Decreased safety awareness  PT Plan  PT Frequency (ACUTE ONLY) Min 3X/week  PT Treatment/Interventions (ACUTE ONLY) DME instruction;Gait training;Stair training;Functional mobility training;Therapeutic activities;Therapeutic exercise;Balance training;Patient/family education  AM-PAC PT "6 Clicks" Mobility Outcome Measure (Version 2)  Help needed turning from your back to your side while in a flat bed without using bedrails? 3  Help needed moving from lying on your back to sitting on the side of a flat bed without using bedrails? 3  Help needed moving to and from a bed to a chair (including a wheelchair)? 3  Help needed standing up from a chair using your arms (e.g., wheelchair or bedside chair)? 3  Help needed to walk in hospital room? 3  Help needed climbing 3-5 steps with a railing?  3  6 Click Score 18  Consider Recommendation of Discharge To: Home with [facility]  Progressive Mobility  What is the highest level of mobility based on the progressive mobility assessment? Level 4 (Walks with assist in room) - Balance while marching in place  and cannot step forward and back - Complete  Mobility Ambulated with assistance in hallway;Ambulated with assistance in room;Out of bed to chair with meals;Out of bed for toileting  PT Recommendation  Follow Up Recommendations Home health PT  PT equipment Rolling walker with 5" wheels  Individuals Consulted  Consulted and Agree with Results and Recommendations Patient  Acute Rehab PT Goals  Patient Stated Goal get home and get endurance back  PT Goal Formulation With patient  Time For Goal Achievement 05/13/21  Potential to Achieve Goals Good  PT Time Calculation  PT Start Time (ACUTE ONLY) 1126  PT Stop Time (ACUTE ONLY) 1142  PT Time Calculation (min) (ACUTE ONLY) 16 min  PT General Charges  $$ ACUTE PT VISIT 1 Visit  PT Evaluation  $PT Eval Low Complexity 1 Low    Verner Mould, DPT Acute Rehabilitation Services Office [phone] Pager [phone]     ELLIYAH LISZEWSKI 04/29/2021, 7:48 PM

## 2021-04-29 NOTE — TOC Transition Note (Signed)
Transition of Care [facility]) - CM/SW Discharge Note   Patient Details  Name: Tamara Friedman MRN: [id number] Date of Birth: 1950-09-21  [facility]) CM/SW Contact:  Trish Mage, LCSW Phone Number: 04/29/2021, 11:58 AM   Clinical Narrative:   Alerted by PT that patient is in need of [place] PT, DME rolling walker, and that she wants both.  [name] lives with her grandson, who works during the day.  She is not in need of supervision.  Cindie with [facility] agreed to provide [facility] services, [facility] will deliver DME.  No further needs identified.  TOC sign off.    Final next level of care: [place] Barriers to Discharge: No Barriers Identified   Patient Goals and CMS Choice        Discharge Placement                       Discharge Plan and Services                                     Social Determinants of Health (SDOH) Interventions     Readmission Risk Interventions No flowsheet data found.

## 2021-04-30 DIAGNOSIS — N179 Acute kidney failure, unspecified: Secondary | ICD-10-CM | POA: Diagnosis not present

## 2021-04-30 DIAGNOSIS — I509 Heart failure, unspecified: Secondary | ICD-10-CM | POA: Diagnosis not present

## 2021-04-30 DIAGNOSIS — M160 Bilateral primary osteoarthritis of hip: Secondary | ICD-10-CM | POA: Diagnosis not present

## 2021-04-30 DIAGNOSIS — M479 Spondylosis, unspecified: Secondary | ICD-10-CM | POA: Diagnosis not present

## 2021-04-30 DIAGNOSIS — I700 Atherosclerosis of aorta: Secondary | ICD-10-CM | POA: Diagnosis not present

## 2021-04-30 DIAGNOSIS — Z9181 History of falling: Secondary | ICD-10-CM | POA: Diagnosis not present

## 2021-04-30 DIAGNOSIS — I083 Combined rheumatic disorders of mitral, aortic and tricuspid valves: Secondary | ICD-10-CM | POA: Diagnosis not present

## 2021-04-30 DIAGNOSIS — K5730 Diverticulosis of large intestine without perforation or abscess without bleeding: Secondary | ICD-10-CM | POA: Diagnosis not present

## 2021-04-30 DIAGNOSIS — I959 Hypotension, unspecified: Secondary | ICD-10-CM | POA: Diagnosis not present

## 2021-04-30 DIAGNOSIS — Z7982 Long term (current) use of aspirin: Secondary | ICD-10-CM | POA: Diagnosis not present

## 2021-04-30 DIAGNOSIS — E7800 Pure hypercholesterolemia, unspecified: Secondary | ICD-10-CM | POA: Diagnosis not present

## 2021-04-30 DIAGNOSIS — I130 Hypertensive heart and chronic kidney disease with heart failure and stage 1 through stage 4 chronic kidney disease, or unspecified chronic kidney disease: Secondary | ICD-10-CM | POA: Diagnosis not present

## 2021-04-30 DIAGNOSIS — A059 Bacterial foodborne intoxication, unspecified: Secondary | ICD-10-CM | POA: Diagnosis not present

## 2021-04-30 DIAGNOSIS — N184 Chronic kidney disease, stage 4 (severe): Secondary | ICD-10-CM | POA: Diagnosis not present

## 2021-04-30 DIAGNOSIS — K429 Umbilical hernia without obstruction or gangrene: Secondary | ICD-10-CM | POA: Diagnosis not present

## 2021-04-30 DIAGNOSIS — K449 Diaphragmatic hernia without obstruction or gangrene: Secondary | ICD-10-CM | POA: Diagnosis not present

## 2021-04-30 DIAGNOSIS — I2510 Atherosclerotic heart disease of native coronary artery without angina pectoris: Secondary | ICD-10-CM | POA: Diagnosis not present

## 2021-04-30 DIAGNOSIS — Q6101 Congenital single renal cyst: Secondary | ICD-10-CM | POA: Diagnosis not present

## 2021-05-05 DIAGNOSIS — K429 Umbilical hernia without obstruction or gangrene: Secondary | ICD-10-CM | POA: Diagnosis not present

## 2021-05-05 DIAGNOSIS — Q6101 Congenital single renal cyst: Secondary | ICD-10-CM | POA: Diagnosis not present

## 2021-05-05 DIAGNOSIS — I700 Atherosclerosis of aorta: Secondary | ICD-10-CM | POA: Diagnosis not present

## 2021-05-05 DIAGNOSIS — M160 Bilateral primary osteoarthritis of hip: Secondary | ICD-10-CM | POA: Diagnosis not present

## 2021-05-05 DIAGNOSIS — K5730 Diverticulosis of large intestine without perforation or abscess without bleeding: Secondary | ICD-10-CM | POA: Diagnosis not present

## 2021-05-05 DIAGNOSIS — M479 Spondylosis, unspecified: Secondary | ICD-10-CM | POA: Diagnosis not present

## 2021-05-05 DIAGNOSIS — K449 Diaphragmatic hernia without obstruction or gangrene: Secondary | ICD-10-CM | POA: Diagnosis not present

## 2021-05-05 DIAGNOSIS — I2510 Atherosclerotic heart disease of native coronary artery without angina pectoris: Secondary | ICD-10-CM | POA: Diagnosis not present

## 2021-05-05 DIAGNOSIS — I509 Heart failure, unspecified: Secondary | ICD-10-CM | POA: Diagnosis not present

## 2021-05-05 DIAGNOSIS — N184 Chronic kidney disease, stage 4 (severe): Secondary | ICD-10-CM | POA: Diagnosis not present

## 2021-05-05 DIAGNOSIS — I959 Hypotension, unspecified: Secondary | ICD-10-CM | POA: Diagnosis not present

## 2021-05-05 DIAGNOSIS — Z7982 Long term (current) use of aspirin: Secondary | ICD-10-CM | POA: Diagnosis not present

## 2021-05-05 DIAGNOSIS — I130 Hypertensive heart and chronic kidney disease with heart failure and stage 1 through stage 4 chronic kidney disease, or unspecified chronic kidney disease: Secondary | ICD-10-CM | POA: Diagnosis not present

## 2021-05-05 DIAGNOSIS — A059 Bacterial foodborne intoxication, unspecified: Secondary | ICD-10-CM | POA: Diagnosis not present

## 2021-05-05 DIAGNOSIS — Z9181 History of falling: Secondary | ICD-10-CM | POA: Diagnosis not present

## 2021-05-05 DIAGNOSIS — I083 Combined rheumatic disorders of mitral, aortic and tricuspid valves: Secondary | ICD-10-CM | POA: Diagnosis not present

## 2021-05-05 DIAGNOSIS — E7800 Pure hypercholesterolemia, unspecified: Secondary | ICD-10-CM | POA: Diagnosis not present

## 2021-05-05 DIAGNOSIS — N179 Acute kidney failure, unspecified: Secondary | ICD-10-CM | POA: Diagnosis not present

## 2021-05-06 DIAGNOSIS — I10 Essential (primary) hypertension: Secondary | ICD-10-CM | POA: Diagnosis not present

## 2021-05-06 DIAGNOSIS — I509 Heart failure, unspecified: Secondary | ICD-10-CM | POA: Diagnosis not present

## 2021-05-06 DIAGNOSIS — E785 Hyperlipidemia, unspecified: Secondary | ICD-10-CM | POA: Diagnosis not present

## 2021-05-07 DIAGNOSIS — A059 Bacterial foodborne intoxication, unspecified: Secondary | ICD-10-CM | POA: Diagnosis not present

## 2021-05-07 DIAGNOSIS — Z9181 History of falling: Secondary | ICD-10-CM | POA: Diagnosis not present

## 2021-05-07 DIAGNOSIS — M479 Spondylosis, unspecified: Secondary | ICD-10-CM | POA: Diagnosis not present

## 2021-05-07 DIAGNOSIS — M160 Bilateral primary osteoarthritis of hip: Secondary | ICD-10-CM | POA: Diagnosis not present

## 2021-05-07 DIAGNOSIS — E7800 Pure hypercholesterolemia, unspecified: Secondary | ICD-10-CM | POA: Diagnosis not present

## 2021-05-07 DIAGNOSIS — K449 Diaphragmatic hernia without obstruction or gangrene: Secondary | ICD-10-CM | POA: Diagnosis not present

## 2021-05-07 DIAGNOSIS — N184 Chronic kidney disease, stage 4 (severe): Secondary | ICD-10-CM | POA: Diagnosis not present

## 2021-05-07 DIAGNOSIS — Z7982 Long term (current) use of aspirin: Secondary | ICD-10-CM | POA: Diagnosis not present

## 2021-05-07 DIAGNOSIS — I700 Atherosclerosis of aorta: Secondary | ICD-10-CM | POA: Diagnosis not present

## 2021-05-07 DIAGNOSIS — I959 Hypotension, unspecified: Secondary | ICD-10-CM | POA: Diagnosis not present

## 2021-05-07 DIAGNOSIS — K429 Umbilical hernia without obstruction or gangrene: Secondary | ICD-10-CM | POA: Diagnosis not present

## 2021-05-07 DIAGNOSIS — Q6101 Congenital single renal cyst: Secondary | ICD-10-CM | POA: Diagnosis not present

## 2021-05-07 DIAGNOSIS — N179 Acute kidney failure, unspecified: Secondary | ICD-10-CM | POA: Diagnosis not present

## 2021-05-07 DIAGNOSIS — I083 Combined rheumatic disorders of mitral, aortic and tricuspid valves: Secondary | ICD-10-CM | POA: Diagnosis not present

## 2021-05-07 DIAGNOSIS — I509 Heart failure, unspecified: Secondary | ICD-10-CM | POA: Diagnosis not present

## 2021-05-07 DIAGNOSIS — I2510 Atherosclerotic heart disease of native coronary artery without angina pectoris: Secondary | ICD-10-CM | POA: Diagnosis not present

## 2021-05-07 DIAGNOSIS — I130 Hypertensive heart and chronic kidney disease with heart failure and stage 1 through stage 4 chronic kidney disease, or unspecified chronic kidney disease: Secondary | ICD-10-CM | POA: Diagnosis not present

## 2021-05-07 DIAGNOSIS — K5730 Diverticulosis of large intestine without perforation or abscess without bleeding: Secondary | ICD-10-CM | POA: Diagnosis not present

## 2021-05-10 DIAGNOSIS — A4189 Other specified sepsis: Secondary | ICD-10-CM | POA: Diagnosis not present

## 2021-05-10 DIAGNOSIS — N1832 Chronic kidney disease, stage 3b: Secondary | ICD-10-CM | POA: Diagnosis not present

## 2021-05-10 DIAGNOSIS — N189 Chronic kidney disease, unspecified: Secondary | ICD-10-CM | POA: Diagnosis not present

## 2021-05-10 DIAGNOSIS — E782 Mixed hyperlipidemia: Secondary | ICD-10-CM | POA: Diagnosis not present

## 2021-05-10 DIAGNOSIS — I129 Hypertensive chronic kidney disease with stage 1 through stage 4 chronic kidney disease, or unspecified chronic kidney disease: Secondary | ICD-10-CM | POA: Diagnosis not present

## 2021-05-10 DIAGNOSIS — A051 Botulism food poisoning: Secondary | ICD-10-CM | POA: Diagnosis not present

## 2021-05-10 DIAGNOSIS — R7303 Prediabetes: Secondary | ICD-10-CM | POA: Diagnosis not present

## 2021-05-11 DIAGNOSIS — K449 Diaphragmatic hernia without obstruction or gangrene: Secondary | ICD-10-CM | POA: Diagnosis not present

## 2021-05-11 DIAGNOSIS — E7800 Pure hypercholesterolemia, unspecified: Secondary | ICD-10-CM | POA: Diagnosis not present

## 2021-05-11 DIAGNOSIS — I700 Atherosclerosis of aorta: Secondary | ICD-10-CM | POA: Diagnosis not present

## 2021-05-11 DIAGNOSIS — K5730 Diverticulosis of large intestine without perforation or abscess without bleeding: Secondary | ICD-10-CM | POA: Diagnosis not present

## 2021-05-11 DIAGNOSIS — I130 Hypertensive heart and chronic kidney disease with heart failure and stage 1 through stage 4 chronic kidney disease, or unspecified chronic kidney disease: Secondary | ICD-10-CM | POA: Diagnosis not present

## 2021-05-11 DIAGNOSIS — M479 Spondylosis, unspecified: Secondary | ICD-10-CM | POA: Diagnosis not present

## 2021-05-11 DIAGNOSIS — Z9181 History of falling: Secondary | ICD-10-CM | POA: Diagnosis not present

## 2021-05-11 DIAGNOSIS — I509 Heart failure, unspecified: Secondary | ICD-10-CM | POA: Diagnosis not present

## 2021-05-11 DIAGNOSIS — N179 Acute kidney failure, unspecified: Secondary | ICD-10-CM | POA: Diagnosis not present

## 2021-05-11 DIAGNOSIS — A059 Bacterial foodborne intoxication, unspecified: Secondary | ICD-10-CM | POA: Diagnosis not present

## 2021-05-11 DIAGNOSIS — K429 Umbilical hernia without obstruction or gangrene: Secondary | ICD-10-CM | POA: Diagnosis not present

## 2021-05-11 DIAGNOSIS — Z7982 Long term (current) use of aspirin: Secondary | ICD-10-CM | POA: Diagnosis not present

## 2021-05-11 DIAGNOSIS — M160 Bilateral primary osteoarthritis of hip: Secondary | ICD-10-CM | POA: Diagnosis not present

## 2021-05-11 DIAGNOSIS — I2510 Atherosclerotic heart disease of native coronary artery without angina pectoris: Secondary | ICD-10-CM | POA: Diagnosis not present

## 2021-05-11 DIAGNOSIS — N184 Chronic kidney disease, stage 4 (severe): Secondary | ICD-10-CM | POA: Diagnosis not present

## 2021-05-11 DIAGNOSIS — Q6101 Congenital single renal cyst: Secondary | ICD-10-CM | POA: Diagnosis not present

## 2021-05-11 DIAGNOSIS — I959 Hypotension, unspecified: Secondary | ICD-10-CM | POA: Diagnosis not present

## 2021-05-11 DIAGNOSIS — I083 Combined rheumatic disorders of mitral, aortic and tricuspid valves: Secondary | ICD-10-CM | POA: Diagnosis not present

## 2021-05-14 DIAGNOSIS — N179 Acute kidney failure, unspecified: Secondary | ICD-10-CM | POA: Diagnosis not present

## 2021-05-14 DIAGNOSIS — Z7982 Long term (current) use of aspirin: Secondary | ICD-10-CM | POA: Diagnosis not present

## 2021-05-14 DIAGNOSIS — Q6101 Congenital single renal cyst: Secondary | ICD-10-CM | POA: Diagnosis not present

## 2021-05-14 DIAGNOSIS — M160 Bilateral primary osteoarthritis of hip: Secondary | ICD-10-CM | POA: Diagnosis not present

## 2021-05-14 DIAGNOSIS — K449 Diaphragmatic hernia without obstruction or gangrene: Secondary | ICD-10-CM | POA: Diagnosis not present

## 2021-05-14 DIAGNOSIS — K429 Umbilical hernia without obstruction or gangrene: Secondary | ICD-10-CM | POA: Diagnosis not present

## 2021-05-14 DIAGNOSIS — I509 Heart failure, unspecified: Secondary | ICD-10-CM | POA: Diagnosis not present

## 2021-05-14 DIAGNOSIS — I2510 Atherosclerotic heart disease of native coronary artery without angina pectoris: Secondary | ICD-10-CM | POA: Diagnosis not present

## 2021-05-14 DIAGNOSIS — I130 Hypertensive heart and chronic kidney disease with heart failure and stage 1 through stage 4 chronic kidney disease, or unspecified chronic kidney disease: Secondary | ICD-10-CM | POA: Diagnosis not present

## 2021-05-14 DIAGNOSIS — I959 Hypotension, unspecified: Secondary | ICD-10-CM | POA: Diagnosis not present

## 2021-05-14 DIAGNOSIS — I700 Atherosclerosis of aorta: Secondary | ICD-10-CM | POA: Diagnosis not present

## 2021-05-14 DIAGNOSIS — M479 Spondylosis, unspecified: Secondary | ICD-10-CM | POA: Diagnosis not present

## 2021-05-14 DIAGNOSIS — K5730 Diverticulosis of large intestine without perforation or abscess without bleeding: Secondary | ICD-10-CM | POA: Diagnosis not present

## 2021-05-14 DIAGNOSIS — I083 Combined rheumatic disorders of mitral, aortic and tricuspid valves: Secondary | ICD-10-CM | POA: Diagnosis not present

## 2021-05-14 DIAGNOSIS — N184 Chronic kidney disease, stage 4 (severe): Secondary | ICD-10-CM | POA: Diagnosis not present

## 2021-05-14 DIAGNOSIS — E7800 Pure hypercholesterolemia, unspecified: Secondary | ICD-10-CM | POA: Diagnosis not present

## 2021-05-14 DIAGNOSIS — Z9181 History of falling: Secondary | ICD-10-CM | POA: Diagnosis not present

## 2021-05-14 DIAGNOSIS — A059 Bacterial foodborne intoxication, unspecified: Secondary | ICD-10-CM | POA: Diagnosis not present

## 2021-05-19 DIAGNOSIS — N179 Acute kidney failure, unspecified: Secondary | ICD-10-CM | POA: Diagnosis not present

## 2021-05-19 DIAGNOSIS — K449 Diaphragmatic hernia without obstruction or gangrene: Secondary | ICD-10-CM | POA: Diagnosis not present

## 2021-05-19 DIAGNOSIS — E7800 Pure hypercholesterolemia, unspecified: Secondary | ICD-10-CM | POA: Diagnosis not present

## 2021-05-19 DIAGNOSIS — I700 Atherosclerosis of aorta: Secondary | ICD-10-CM | POA: Diagnosis not present

## 2021-05-19 DIAGNOSIS — Z7982 Long term (current) use of aspirin: Secondary | ICD-10-CM | POA: Diagnosis not present

## 2021-05-19 DIAGNOSIS — A059 Bacterial foodborne intoxication, unspecified: Secondary | ICD-10-CM | POA: Diagnosis not present

## 2021-05-19 DIAGNOSIS — K5730 Diverticulosis of large intestine without perforation or abscess without bleeding: Secondary | ICD-10-CM | POA: Diagnosis not present

## 2021-05-19 DIAGNOSIS — Q6101 Congenital single renal cyst: Secondary | ICD-10-CM | POA: Diagnosis not present

## 2021-05-19 DIAGNOSIS — I083 Combined rheumatic disorders of mitral, aortic and tricuspid valves: Secondary | ICD-10-CM | POA: Diagnosis not present

## 2021-05-19 DIAGNOSIS — I2510 Atherosclerotic heart disease of native coronary artery without angina pectoris: Secondary | ICD-10-CM | POA: Diagnosis not present

## 2021-05-19 DIAGNOSIS — N184 Chronic kidney disease, stage 4 (severe): Secondary | ICD-10-CM | POA: Diagnosis not present

## 2021-05-19 DIAGNOSIS — K429 Umbilical hernia without obstruction or gangrene: Secondary | ICD-10-CM | POA: Diagnosis not present

## 2021-05-19 DIAGNOSIS — Z9181 History of falling: Secondary | ICD-10-CM | POA: Diagnosis not present

## 2021-05-19 DIAGNOSIS — I130 Hypertensive heart and chronic kidney disease with heart failure and stage 1 through stage 4 chronic kidney disease, or unspecified chronic kidney disease: Secondary | ICD-10-CM | POA: Diagnosis not present

## 2021-05-19 DIAGNOSIS — M479 Spondylosis, unspecified: Secondary | ICD-10-CM | POA: Diagnosis not present

## 2021-05-19 DIAGNOSIS — I959 Hypotension, unspecified: Secondary | ICD-10-CM | POA: Diagnosis not present

## 2021-05-19 DIAGNOSIS — I509 Heart failure, unspecified: Secondary | ICD-10-CM | POA: Diagnosis not present

## 2021-05-19 DIAGNOSIS — M160 Bilateral primary osteoarthritis of hip: Secondary | ICD-10-CM | POA: Diagnosis not present

## 2021-05-26 DIAGNOSIS — A059 Bacterial foodborne intoxication, unspecified: Secondary | ICD-10-CM | POA: Diagnosis not present

## 2021-05-26 DIAGNOSIS — Q6101 Congenital single renal cyst: Secondary | ICD-10-CM | POA: Diagnosis not present

## 2021-05-26 DIAGNOSIS — I959 Hypotension, unspecified: Secondary | ICD-10-CM | POA: Diagnosis not present

## 2021-05-26 DIAGNOSIS — I130 Hypertensive heart and chronic kidney disease with heart failure and stage 1 through stage 4 chronic kidney disease, or unspecified chronic kidney disease: Secondary | ICD-10-CM | POA: Diagnosis not present

## 2021-05-26 DIAGNOSIS — M160 Bilateral primary osteoarthritis of hip: Secondary | ICD-10-CM | POA: Diagnosis not present

## 2021-05-26 DIAGNOSIS — I509 Heart failure, unspecified: Secondary | ICD-10-CM | POA: Diagnosis not present

## 2021-05-26 DIAGNOSIS — Z9181 History of falling: Secondary | ICD-10-CM | POA: Diagnosis not present

## 2021-05-26 DIAGNOSIS — I083 Combined rheumatic disorders of mitral, aortic and tricuspid valves: Secondary | ICD-10-CM | POA: Diagnosis not present

## 2021-05-26 DIAGNOSIS — I700 Atherosclerosis of aorta: Secondary | ICD-10-CM | POA: Diagnosis not present

## 2021-05-26 DIAGNOSIS — K429 Umbilical hernia without obstruction or gangrene: Secondary | ICD-10-CM | POA: Diagnosis not present

## 2021-05-26 DIAGNOSIS — K5730 Diverticulosis of large intestine without perforation or abscess without bleeding: Secondary | ICD-10-CM | POA: Diagnosis not present

## 2021-05-26 DIAGNOSIS — Z7982 Long term (current) use of aspirin: Secondary | ICD-10-CM | POA: Diagnosis not present

## 2021-05-26 DIAGNOSIS — K449 Diaphragmatic hernia without obstruction or gangrene: Secondary | ICD-10-CM | POA: Diagnosis not present

## 2021-05-26 DIAGNOSIS — N179 Acute kidney failure, unspecified: Secondary | ICD-10-CM | POA: Diagnosis not present

## 2021-05-26 DIAGNOSIS — M479 Spondylosis, unspecified: Secondary | ICD-10-CM | POA: Diagnosis not present

## 2021-05-26 DIAGNOSIS — E7800 Pure hypercholesterolemia, unspecified: Secondary | ICD-10-CM | POA: Diagnosis not present

## 2021-05-26 DIAGNOSIS — N184 Chronic kidney disease, stage 4 (severe): Secondary | ICD-10-CM | POA: Diagnosis not present

## 2021-05-26 DIAGNOSIS — I2510 Atherosclerotic heart disease of native coronary artery without angina pectoris: Secondary | ICD-10-CM | POA: Diagnosis not present

## 2021-05-31 DIAGNOSIS — N1832 Chronic kidney disease, stage 3b: Secondary | ICD-10-CM | POA: Diagnosis not present

## 2021-05-31 DIAGNOSIS — Z23 Encounter for immunization: Secondary | ICD-10-CM | POA: Diagnosis not present

## 2021-05-31 DIAGNOSIS — Z0000 Encounter for general adult medical examination without abnormal findings: Secondary | ICD-10-CM | POA: Diagnosis not present

## 2021-05-31 DIAGNOSIS — I129 Hypertensive chronic kidney disease with stage 1 through stage 4 chronic kidney disease, or unspecified chronic kidney disease: Secondary | ICD-10-CM | POA: Diagnosis not present

## 2021-06-14 ENCOUNTER — Ambulatory Visit
Admission: RE | Admit: 2021-06-14 | Discharge: 2021-06-14 | Disposition: A | Discharge status: Home / Self Care | Payer: Medicare Other | Source: Ambulatory Visit | Attending: Cardiology | Admitting: Cardiology

## 2021-06-14 DIAGNOSIS — Z1231 Encounter for screening mammogram for malignant neoplasm of breast: Secondary | ICD-10-CM | POA: Diagnosis not present

## 2021-06-15 ENCOUNTER — Other Ambulatory Visit: Payer: Self-pay | Admitting: Cardiology

## 2021-06-15 DIAGNOSIS — R928 Other abnormal and inconclusive findings on diagnostic imaging of breast: Secondary | ICD-10-CM

## 2021-07-30 ENCOUNTER — Ambulatory Visit
Admission: RE | Admit: 2021-07-30 | Discharge: 2021-07-30 | Disposition: A | Discharge status: Home / Self Care | Payer: Medicare Other | Source: Ambulatory Visit | Attending: Cardiology | Admitting: Cardiology

## 2021-07-30 ENCOUNTER — Ambulatory Visit: Payer: Medicare Other

## 2021-07-30 DIAGNOSIS — R928 Other abnormal and inconclusive findings on diagnostic imaging of breast: Secondary | ICD-10-CM

## 2021-07-30 DIAGNOSIS — R922 Inconclusive mammogram: Secondary | ICD-10-CM | POA: Diagnosis not present

## 2021-08-06 DIAGNOSIS — I509 Heart failure, unspecified: Secondary | ICD-10-CM | POA: Diagnosis not present

## 2021-08-06 DIAGNOSIS — I10 Essential (primary) hypertension: Secondary | ICD-10-CM | POA: Diagnosis not present

## 2021-08-06 DIAGNOSIS — E785 Hyperlipidemia, unspecified: Secondary | ICD-10-CM | POA: Diagnosis not present

## 2021-08-06 DIAGNOSIS — N189 Chronic kidney disease, unspecified: Secondary | ICD-10-CM | POA: Diagnosis not present

## 2021-10-01 DIAGNOSIS — I509 Heart failure, unspecified: Secondary | ICD-10-CM | POA: Diagnosis not present

## 2021-10-01 DIAGNOSIS — I10 Essential (primary) hypertension: Secondary | ICD-10-CM | POA: Diagnosis not present

## 2021-10-01 DIAGNOSIS — E785 Hyperlipidemia, unspecified: Secondary | ICD-10-CM | POA: Diagnosis not present

## 2021-10-08 DIAGNOSIS — I509 Heart failure, unspecified: Secondary | ICD-10-CM | POA: Diagnosis not present

## 2021-10-08 DIAGNOSIS — I10 Essential (primary) hypertension: Secondary | ICD-10-CM | POA: Diagnosis not present

## 2021-10-08 DIAGNOSIS — E785 Hyperlipidemia, unspecified: Secondary | ICD-10-CM | POA: Diagnosis not present

## 2021-10-08 DIAGNOSIS — N189 Chronic kidney disease, unspecified: Secondary | ICD-10-CM | POA: Diagnosis not present

## 2021-11-01 ENCOUNTER — Emergency Department (HOSPITAL_COMMUNITY): Payer: Medicare Other

## 2021-11-01 ENCOUNTER — Other Ambulatory Visit: Payer: Self-pay

## 2021-11-01 ENCOUNTER — Emergency Department (HOSPITAL_COMMUNITY)
Admission: EM | Admit: 2021-11-01 | Discharge: 2021-11-02 | Disposition: A | Discharge status: Home / Self Care | Payer: Medicare Other | Attending: Emergency Medicine | Admitting: Emergency Medicine

## 2021-11-01 ENCOUNTER — Encounter (HOSPITAL_COMMUNITY): Payer: Self-pay

## 2021-11-01 DIAGNOSIS — Z79899 Other long term (current) drug therapy: Secondary | ICD-10-CM | POA: Diagnosis not present

## 2021-11-01 DIAGNOSIS — R112 Nausea with vomiting, unspecified: Secondary | ICD-10-CM | POA: Insufficient documentation

## 2021-11-01 DIAGNOSIS — R1084 Generalized abdominal pain: Secondary | ICD-10-CM | POA: Diagnosis not present

## 2021-11-01 DIAGNOSIS — E872 Acidosis, unspecified: Secondary | ICD-10-CM | POA: Diagnosis not present

## 2021-11-01 DIAGNOSIS — R1110 Vomiting, unspecified: Secondary | ICD-10-CM | POA: Diagnosis not present

## 2021-11-01 DIAGNOSIS — I10 Essential (primary) hypertension: Secondary | ICD-10-CM | POA: Diagnosis present

## 2021-11-01 DIAGNOSIS — I160 Hypertensive urgency: Secondary | ICD-10-CM | POA: Insufficient documentation

## 2021-11-01 DIAGNOSIS — Z7982 Long term (current) use of aspirin: Secondary | ICD-10-CM | POA: Diagnosis not present

## 2021-11-01 DIAGNOSIS — R109 Unspecified abdominal pain: Secondary | ICD-10-CM

## 2021-11-01 DIAGNOSIS — I700 Atherosclerosis of aorta: Secondary | ICD-10-CM | POA: Diagnosis not present

## 2021-11-01 LAB — CBC WITH DIFFERENTIAL/PLATELET
Abs Immature Granulocytes: 0.05 10*3/uL (ref 0.00–0.07)
Basophils Absolute: 0.1 10*3/uL (ref 0.0–0.1)
Basophils Relative: 1 %
Eosinophils Absolute: 0.1 10*3/uL (ref 0.0–0.5)
Eosinophils Relative: 1 %
HCT: 45.9 % (ref 36.0–46.0)
Hemoglobin: 14.2 g/dL (ref 12.0–15.0)
Immature Granulocytes: 1 %
Lymphocytes Relative: 22 %
Lymphs Abs: 2 10*3/uL (ref 0.7–4.0)
MCH: 25.8 pg — ABNORMAL LOW (ref 26.0–34.0)
MCHC: 30.9 g/dL (ref 30.0–36.0)
MCV: 83.3 fL (ref 80.0–100.0)
Monocytes Absolute: 0.4 10*3/uL (ref 0.1–1.0)
Monocytes Relative: 4 %
Neutro Abs: 6.8 10*3/uL (ref 1.7–7.7)
Neutrophils Relative %: 71 %
Platelets: 361 10*3/uL (ref 150–400)
RBC: 5.51 MIL/uL — ABNORMAL HIGH (ref 3.87–5.11)
RDW: 17.9 % — ABNORMAL HIGH (ref 11.5–15.5)
WBC: 9.4 10*3/uL (ref 4.0–10.5)
nRBC: 0 % (ref 0.0–0.2)

## 2021-11-01 LAB — URINALYSIS, ROUTINE W REFLEX MICROSCOPIC
Bilirubin Urine: NEGATIVE
Glucose, UA: NEGATIVE mg/dL
Ketones, ur: NEGATIVE mg/dL
Nitrite: NEGATIVE
Protein, ur: >=300 mg/dL — AB
Specific Gravity, Urine: 1.015 (ref 1.005–1.030)
pH: 5 (ref 5.0–8.0)

## 2021-11-01 LAB — COMPREHENSIVE METABOLIC PANEL
ALT: 14 U/L (ref 0–44)
AST: 20 U/L (ref 15–41)
Albumin: 4.8 g/dL (ref 3.5–5.0)
Alkaline Phosphatase: 125 U/L (ref 38–126)
Anion gap: 11 (ref 5–15)
BUN: 22 mg/dL (ref 8–23)
CO2: 19 mmol/L — ABNORMAL LOW (ref 22–32)
Calcium: 10.2 mg/dL (ref 8.9–10.3)
Chloride: 109 mmol/L (ref 98–111)
Creatinine, Ser: 1.71 mg/dL — ABNORMAL HIGH (ref 0.44–1.00)
GFR, Estimated: 31 mL/min — ABNORMAL LOW (ref >60)
Glucose, Bld: 165 mg/dL — ABNORMAL HIGH (ref 70–99)
Potassium: 4.1 mmol/L (ref 3.5–5.1)
Sodium: 139 mmol/L (ref 135–145)
Total Bilirubin: 0.7 mg/dL (ref 0.3–1.2)
Total Protein: 9.4 g/dL — ABNORMAL HIGH (ref 6.5–8.1)

## 2021-11-01 LAB — LIPASE, BLOOD: Lipase: 80 U/L — ABNORMAL HIGH (ref 11–51)

## 2021-11-01 MED ORDER — HYDROMORPHONE HCL 1 MG/ML IJ SOLN
0.5000 mg | Freq: Once | INTRAMUSCULAR | Status: AC
  Administered 2021-11-01: 0.5 mg via INTRAVENOUS
  Filled 2021-11-01: qty 1

## 2021-11-01 MED ORDER — CLONIDINE HCL 0.1 MG PO TABS
0.1000 mg | ORAL_TABLET | Freq: Once | ORAL | Status: AC
Start: 2021-11-01 — End: 2021-11-01
  Administered 2021-11-01: 0.1 mg via ORAL
  Filled 2021-11-01: qty 1

## 2021-11-01 MED ORDER — LABETALOL HCL 5 MG/ML IV SOLN
20.0000 mg | Freq: Once | INTRAVENOUS | Status: AC
  Administered 2021-11-02: 20 mg via INTRAVENOUS
  Filled 2021-11-01: qty 4

## 2021-11-01 MED ORDER — ENALAPRILAT 1.25 MG/ML IV SOLN
1.2500 mg | Freq: Once | INTRAVENOUS | Status: AC
  Administered 2021-11-01: 1.25 mg via INTRAVENOUS
  Filled 2021-11-01: qty 1

## 2021-11-01 MED ORDER — LISINOPRIL 10 MG PO TABS
40.0000 mg | ORAL_TABLET | Freq: Once | ORAL | Status: AC
  Administered 2021-11-01: 40 mg via ORAL
  Filled 2021-11-01: qty 4

## 2021-11-01 MED ORDER — SODIUM CHLORIDE 0.9 % IV BOLUS
1000.0000 mL | Freq: Once | INTRAVENOUS | Status: AC
  Administered 2021-11-01: 1000 mL via INTRAVENOUS

## 2021-11-01 MED ORDER — HYDROMORPHONE HCL 1 MG/ML IJ SOLN
0.5000 mg | Freq: Once | INTRAMUSCULAR | Status: AC
  Administered 2021-11-02: 0.5 mg via INTRAVENOUS
  Filled 2021-11-01: qty 1

## 2021-11-01 MED ORDER — ONDANSETRON HCL 4 MG/2ML IJ SOLN
4.0000 mg | Freq: Once | INTRAMUSCULAR | Status: AC
  Administered 2021-11-01: 4 mg via INTRAVENOUS
  Filled 2021-11-01: qty 2

## 2021-11-01 MED ORDER — IOHEXOL 300 MG/ML  SOLN
65.0000 mL | Freq: Once | INTRAMUSCULAR | Status: AC | PRN
  Administered 2021-11-01: 65 mL via INTRAVENOUS

## 2021-11-01 NOTE — ED Notes (Signed)
Save blue tube in main lab °

## 2021-11-01 NOTE — ED Triage Notes (Addendum)
Pt reports with abdominal pain and vomiting x 2 days. Pt states that her BP has been elevated for days and is unable to keep them down with the vomiting.  ?

## 2021-11-01 NOTE — ED Notes (Signed)
While starting pt's IV, pt was arousable to verbal stimuli, but somnolent. When asked if pt is having headache, visual changes, dizziness/lightheadedness, pt nodded head "yes". Pt remains extremely hypertensive. Dr Tyrone Nine notified of pt's symptoms and concerns. Raymond aware as well. ?

## 2021-11-01 NOTE — ED Provider Notes (Signed)
?[facility] DEPT ?Provider Note ? ? ?CSN: [id number] ?Arrival date & time: 11/01/21  2050 ? ?  ? ?History ? ?Chief Complaint  ?Patient presents with  ? Abdominal Pain  ? Emesis  ? Hypertension  ? ? ?Tamara Friedman is a 71 y.o. female. ? ?71 yo F with a chief complaint of abdominal pain nausea and vomiting.  This been going on for about 3 days now.  She has been describing some lower abdominal discomfort with this.  Having trouble eating and drinking.  Unable to even tolerate her home blood pressure medicines.  She denies any fevers denies sick contacts denies diarrhea.  Denies history of prior abdominal surgery. ? ? ?Abdominal Pain ?Associated symptoms: vomiting   ?Emesis ?Associated symptoms: abdominal pain   ?Hypertension ?Associated symptoms include abdominal pain.  ? ?  ? ?Home Medications ?Prior to Admission medications   ?Medication Sig Start Date End Date Taking? Authorizing Provider  ?ondansetron (ZOFRAN-ODT) 8 MG disintegrating tablet Take 1 tablet (8 mg total) by mouth every 8 (eight) hours as needed for nausea or vomiting. 11/02/21  Yes Molpus, John, MD  ?aspirin 81 MG tablet Take 81 mg by mouth daily.     [provider]  ?atorvastatin (LIPITOR) 10 MG tablet Take 10 mg by mouth daily.    [provider]  ?carvedilol (COREG) 25 MG tablet Take 25 mg by mouth 2 (two) times daily with a meal.    [provider]  ?cloNIDine (CATAPRES) 0.1 MG tablet Take 1 tablet (0.1 mg total) by mouth 3 (three) times daily. 04/29/21   [name], MD  ?ibuprofen (ADVIL) 200 MG tablet Take 400 mg by mouth every 6 (six) hours as needed for moderate pain.    [provider]  ?lisinopril (ZESTRIL) 40 MG tablet Take 1 tablet (40 mg total) by mouth daily. 04/29/21   [name], MD  ?   ? ?Allergies    ?Patient has no known allergies.   ? ?Review of Systems   ?Review of Systems  ?Gastrointestinal:  Positive for abdominal pain and vomiting.  ? ?Physical Exam ?Updated  Vital Signs ?BP (!) 143/107   Pulse 91   Temp 98.4 ?F (36.9 ?C) (Oral)   Resp 16   Ht '5\' 6"'$  (1.676 m)   Wt 72.6 kg   SpO2 100%   BMI 25.82 kg/m?  ?Physical Exam ?Vitals and nursing note reviewed.  ?Constitutional:   ?   General: She is not in acute distress. ?   Appearance: She is well-developed. She is not diaphoretic.  ?HENT:  ?   Head: Normocephalic and atraumatic.  ?Eyes:  ?   Pupils: Pupils are equal, round, and reactive to light.  ?Cardiovascular:  ?   Rate and Rhythm: Normal rate and regular rhythm.  ?   Heart sounds: No murmur heard. ?  No friction rub. No gallop.  ?Pulmonary:  ?   Effort: Pulmonary effort is normal.  ?   Breath sounds: No wheezing or rales.  ?Abdominal:  ?   General: There is no distension.  ?   Palpations: Abdomen is soft.  ?   Tenderness: There is abdominal tenderness.  ?   Comments: Mild diffuse abdominal discomfort negative Murphy sign no significant pain in the right lower quadrant  ?Musculoskeletal:     ?   General: No tenderness.  ?   Cervical back: Normal range of motion and neck supple.  ?Skin: ?   General: Skin is warm and dry.  ?  Neurological:  ?   Mental Status: She is alert and oriented to person, place, and time.  ?Psychiatric:     ?   Behavior: Behavior normal.  ? ? ?ED Results / Procedures / Treatments   ?Labs ?(all labs ordered are listed, but only abnormal results are displayed) ?Labs Reviewed  ?COMPREHENSIVE METABOLIC PANEL - Abnormal; Notable for the following components:  ?    Result Value  ? CO2 19 (*)   ? Glucose, Bld 165 (*)   ? Creatinine, Ser 1.71 (*)   ? Total Protein 9.4 (*)   ? GFR, Estimated 31 (*)   ? All other components within normal limits  ?LIPASE, BLOOD - Abnormal; Notable for the following components:  ? Lipase 80 (*)   ? All other components within normal limits  ?CBC WITH DIFFERENTIAL/PLATELET - Abnormal; Notable for the following components:  ? RBC 5.51 (*)   ? MCH 25.8 (*)   ? RDW 17.9 (*)   ? All other components within normal limits   ?URINALYSIS, ROUTINE W REFLEX MICROSCOPIC - Abnormal; Notable for the following components:  ? APPearance HAZY (*)   ? Hgb urine dipstick SMALL (*)   ? Protein, ur >=300 (*)   ? Leukocytes,Ua MODERATE (*)   ? Bacteria, UA FEW (*)   ? All other components within normal limits  ? ? ?EKG ?EKG Interpretation ? ?Date/Time:  Monday November 01 2021 21:39:25 EDT ?Ventricular Rate:  92 ?PR Interval:  209 ?QRS Duration: 85 ?QT Interval:  393 ?QTC Calculation: 487 ?R Axis:   60 ?Text Interpretation: Sinus rhythm Borderline prolonged PR interval Biatrial enlargement Left ventricular hypertrophy Borderline prolonged QT interval No significant change since last tracing Confirmed by Deno Etienne [phone]) on 11/01/2021 9:48:00 PM ? ?Radiology ?No results found. ? ?Procedures ?Procedures  ? ? ?Medications Ordered in ED ?Medications  ?HYDROmorphone (DILAUDID) injection 0.5 mg (0.5 mg Intravenous Given 11/01/21 2220)  ?ondansetron [facility]) injection 4 mg (4 mg Intravenous Given 11/01/21 2220)  ?sodium chloride 0.9 % bolus 1,000 mL (0 mLs Intravenous Stopped 11/01/21 2328)  ?sodium chloride 0.9 % bolus 1,000 mL (0 mLs Intravenous Stopped 11/02/21 0207)  ?iohexol (OMNIPAQUE) 300 MG/ML solution 65 mL (65 mLs Intravenous Contrast Given 11/01/21 2235)  ?enalaprilat (VASOTEC) injection 1.25 mg (1.25 mg Intravenous Given 11/01/21 2323)  ?cloNIDine (CATAPRES) tablet 0.1 mg (0.1 mg Oral Given 11/01/21 2325)  ?lisinopril (ZESTRIL) tablet 40 mg (40 mg Oral Given 11/01/21 2325)  ?ondansetron [facility]) injection 4 mg (4 mg Intravenous Given 11/01/21 2322)  ?labetalol (NORMODYNE) injection 20 mg (20 mg Intravenous Given 11/02/21 0011)  ?HYDROmorphone (DILAUDID) injection 0.5 mg (0.5 mg Intravenous Given 11/02/21 0010)  ? ? ?ED Course/ Medical Decision Making/ A&P ?  ?                        ?Medical Decision Making ?Amount and/or Complexity of Data Reviewed ?Labs: ordered. ?Radiology: ordered. ? ?Risk ?Prescription drug management. ? ? ?71 yo F with a chief complaint of  abdominal discomfort.  This been going on for about 3 days now.  Associate with some nausea and vomiting.  No fevers at home as far she knows.  Fairly benign abdominal exam but the patient looks very uncomfortable on exam.  We will obtain a CT scan.  Attempted treat pain and nausea.  Blood work UA reassess. ? ?Patient's renal function is slightly improved from last check.  Mild metabolic acidosis without anion gap.  No leukocytosis no  anemia awaiting CT. ? ?[place] negative for infection.  CT with constipation, no other noted acute finding.  Patient reassessed and feeling a bit better nausea improved.   ? ?However the patients blood pressure has trending upward during her stay.  Was given enalapril without obvious change.   ? ?Given home meds.  With some mild downtrend, still >537 systolic.  Give dose of labetalol, still complaining of abdominal pain 8/10 will treat pain with repeat narcotic dose.  ? ?Signed out to Dr. Florina Ou, please see his note for further details of care in the ED. ? ?CRITICAL CARE ?Performed by: Cecilio Asper ? ? ?Total critical care time: 35 minutes ? ?Critical care time was exclusive of separately billable procedures and treating other patients. ? ?Critical care was necessary to treat or prevent imminent or life-threatening deterioration. ? ?Critical care was time spent personally by me on the following activities: development of treatment plan with patient and/or surrogate as well as nursing, discussions with consultants, evaluation of patient's response to treatment, examination of patient, obtaining history from patient or surrogate, ordering and performing treatments and interventions, ordering and review of laboratory studies, ordering and review of radiographic studies, pulse oximetry and re-evaluation of patient's condition. ? ? ? ? ? ? ? ? ?Final Clinical Impression(s) / ED Diagnoses ?Final diagnoses:  ?Nausea and vomiting in adult  ?Recurrent abdominal pain  ?Hypertensive urgency   ? ? ?Rx / DC Orders ?ED Discharge Orders   ? ?      Ordered  ?  ondansetron (ZOFRAN-ODT) 8 MG disintegrating tablet  Every 8 hours PRN       ? 11/02/21 0152  ? ?  ?  ? ?  ? ? ?  ?Deno Etienne, DO ?11/04/21 [id number] ? ?

## 2021-11-02 MED ORDER — ONDANSETRON 8 MG PO TBDP: 8.0000 mg | ORAL_TABLET | Freq: Three times a day (TID) | ORAL | 0 refills | Status: DC | PRN

## 2021-11-02 NOTE — ED Notes (Addendum)
Pt called out requesting restroom assistance. Upon entering room, pt found to be trying to get out of bed by herself. Pt was disconnected from all monitoring cords and was able to ambulate a couple of steps to bedside commode. Call light is within reach and pt coached on using it when she is finished. Pt nodded in agreement. Will check back to assist pt back into bed and to reconnect v/s monitoring devices. ?

## 2021-11-02 NOTE — ED Provider Notes (Signed)
1:50 AM ?Blood pressure now 135/93.  Patient's pain improved.  She is able to drink fluids without vomiting.  CT scan shows no acute pathology.  Her creatinine is slightly improved from previous values. ? ? ?  ?Arling Cerone, MD ?11/02/21 0151 ? ?

## 2021-11-02 NOTE — ED Notes (Signed)
Pt assisted back into bed from bedside commode. Pt also provided with ginger ale for PO challenge. ?

## 2021-11-09 DIAGNOSIS — E782 Mixed hyperlipidemia: Secondary | ICD-10-CM | POA: Diagnosis not present

## 2021-11-09 DIAGNOSIS — K909 Intestinal malabsorption, unspecified: Secondary | ICD-10-CM | POA: Diagnosis not present

## 2021-11-09 DIAGNOSIS — I129 Hypertensive chronic kidney disease with stage 1 through stage 4 chronic kidney disease, or unspecified chronic kidney disease: Secondary | ICD-10-CM | POA: Diagnosis not present

## 2021-11-09 DIAGNOSIS — D649 Anemia, unspecified: Secondary | ICD-10-CM | POA: Diagnosis not present

## 2021-11-09 DIAGNOSIS — R739 Hyperglycemia, unspecified: Secondary | ICD-10-CM | POA: Diagnosis not present

## 2021-11-09 DIAGNOSIS — D509 Iron deficiency anemia, unspecified: Secondary | ICD-10-CM | POA: Diagnosis not present

## 2021-11-19 DIAGNOSIS — N399 Disorder of urinary system, unspecified: Secondary | ICD-10-CM | POA: Diagnosis not present

## 2021-11-19 DIAGNOSIS — I129 Hypertensive chronic kidney disease with stage 1 through stage 4 chronic kidney disease, or unspecified chronic kidney disease: Secondary | ICD-10-CM | POA: Diagnosis not present

## 2021-11-28 ENCOUNTER — Observation Stay (HOSPITAL_COMMUNITY): Payer: Medicare Other

## 2021-11-28 ENCOUNTER — Inpatient Hospital Stay (HOSPITAL_COMMUNITY)
Admission: EM | Admit: 2021-11-28 | Discharge: 2021-11-30 | DRG: 640 | Disposition: A | Discharge status: Home Health | Payer: Medicare Other | Attending: Internal Medicine | Admitting: Internal Medicine

## 2021-11-28 ENCOUNTER — Emergency Department (HOSPITAL_COMMUNITY): Payer: Medicare Other

## 2021-11-28 ENCOUNTER — Encounter (HOSPITAL_COMMUNITY): Payer: Self-pay | Admitting: Oncology

## 2021-11-28 ENCOUNTER — Other Ambulatory Visit: Payer: Self-pay

## 2021-11-28 DIAGNOSIS — N390 Urinary tract infection, site not specified: Secondary | ICD-10-CM | POA: Diagnosis not present

## 2021-11-28 DIAGNOSIS — K529 Noninfective gastroenteritis and colitis, unspecified: Secondary | ICD-10-CM | POA: Diagnosis present

## 2021-11-28 DIAGNOSIS — K859 Acute pancreatitis without necrosis or infection, unspecified: Secondary | ICD-10-CM | POA: Diagnosis not present

## 2021-11-28 DIAGNOSIS — R197 Diarrhea, unspecified: Secondary | ICD-10-CM | POA: Diagnosis not present

## 2021-11-28 DIAGNOSIS — N184 Chronic kidney disease, stage 4 (severe): Secondary | ICD-10-CM | POA: Diagnosis not present

## 2021-11-28 DIAGNOSIS — R109 Unspecified abdominal pain: Principal | ICD-10-CM

## 2021-11-28 DIAGNOSIS — R531 Weakness: Secondary | ICD-10-CM

## 2021-11-28 DIAGNOSIS — T368X5A Adverse effect of other systemic antibiotics, initial encounter: Secondary | ICD-10-CM | POA: Diagnosis present

## 2021-11-28 DIAGNOSIS — Z7982 Long term (current) use of aspirin: Secondary | ICD-10-CM

## 2021-11-28 DIAGNOSIS — Z79899 Other long term (current) drug therapy: Secondary | ICD-10-CM

## 2021-11-28 DIAGNOSIS — I10 Essential (primary) hypertension: Secondary | ICD-10-CM | POA: Diagnosis present

## 2021-11-28 DIAGNOSIS — N179 Acute kidney failure, unspecified: Secondary | ICD-10-CM | POA: Diagnosis present

## 2021-11-28 DIAGNOSIS — K5730 Diverticulosis of large intestine without perforation or abscess without bleeding: Secondary | ICD-10-CM | POA: Diagnosis not present

## 2021-11-28 DIAGNOSIS — R112 Nausea with vomiting, unspecified: Secondary | ICD-10-CM | POA: Diagnosis present

## 2021-11-28 DIAGNOSIS — E785 Hyperlipidemia, unspecified: Secondary | ICD-10-CM

## 2021-11-28 DIAGNOSIS — E860 Dehydration: Principal | ICD-10-CM | POA: Diagnosis present

## 2021-11-28 DIAGNOSIS — K4090 Unilateral inguinal hernia, without obstruction or gangrene, not specified as recurrent: Secondary | ICD-10-CM | POA: Diagnosis not present

## 2021-11-28 DIAGNOSIS — E872 Acidosis, unspecified: Secondary | ICD-10-CM | POA: Diagnosis present

## 2021-11-28 DIAGNOSIS — I129 Hypertensive chronic kidney disease with stage 1 through stage 4 chronic kidney disease, or unspecified chronic kidney disease: Secondary | ICD-10-CM | POA: Diagnosis present

## 2021-11-28 DIAGNOSIS — I700 Atherosclerosis of aorta: Secondary | ICD-10-CM | POA: Diagnosis not present

## 2021-11-28 DIAGNOSIS — K449 Diaphragmatic hernia without obstruction or gangrene: Secondary | ICD-10-CM | POA: Diagnosis not present

## 2021-11-28 DIAGNOSIS — E7800 Pure hypercholesterolemia, unspecified: Secondary | ICD-10-CM | POA: Diagnosis not present

## 2021-11-28 DIAGNOSIS — K760 Fatty (change of) liver, not elsewhere classified: Secondary | ICD-10-CM | POA: Diagnosis not present

## 2021-11-28 LAB — COMPREHENSIVE METABOLIC PANEL
ALT: 18 U/L (ref 0–44)
AST: 15 U/L (ref 15–41)
Albumin: 3.7 g/dL (ref 3.5–5.0)
Alkaline Phosphatase: 92 U/L (ref 38–126)
Anion gap: 7 (ref 5–15)
BUN: 38 mg/dL — ABNORMAL HIGH (ref 8–23)
CO2: 17 mmol/L — ABNORMAL LOW (ref 22–32)
Calcium: 9.5 mg/dL (ref 8.9–10.3)
Chloride: 114 mmol/L — ABNORMAL HIGH (ref 98–111)
Creatinine, Ser: 2.41 mg/dL — ABNORMAL HIGH (ref 0.44–1.00)
GFR, Estimated: 21 mL/min — ABNORMAL LOW (ref >60)
Glucose, Bld: 144 mg/dL — ABNORMAL HIGH (ref 70–99)
Potassium: 5.1 mmol/L (ref 3.5–5.1)
Sodium: 138 mmol/L (ref 135–145)
Total Bilirubin: 0.5 mg/dL (ref 0.3–1.2)
Total Protein: 8.2 g/dL — ABNORMAL HIGH (ref 6.5–8.1)

## 2021-11-28 LAB — URINALYSIS, ROUTINE W REFLEX MICROSCOPIC
Bacteria, UA: NONE SEEN
Bilirubin Urine: NEGATIVE
Glucose, UA: NEGATIVE mg/dL
Ketones, ur: NEGATIVE mg/dL
Leukocytes,Ua: NEGATIVE
Nitrite: NEGATIVE
Protein, ur: 30 mg/dL — AB
Specific Gravity, Urine: 1.01 (ref 1.005–1.030)
pH: 5 (ref 5.0–8.0)

## 2021-11-28 LAB — CBC
HCT: 39.8 % (ref 36.0–46.0)
Hemoglobin: 12.5 g/dL (ref 12.0–15.0)
MCH: 26.7 pg (ref 26.0–34.0)
MCHC: 31.4 g/dL (ref 30.0–36.0)
MCV: 84.9 fL (ref 80.0–100.0)
Platelets: 231 10*3/uL (ref 150–400)
RBC: 4.69 MIL/uL (ref 3.87–5.11)
RDW: 17.2 % — ABNORMAL HIGH (ref 11.5–15.5)
WBC: 8.7 10*3/uL (ref 4.0–10.5)
nRBC: 0 % (ref 0.0–0.2)

## 2021-11-28 LAB — TROPONIN I (HIGH SENSITIVITY)
Troponin I (High Sensitivity): 31 ng/L — ABNORMAL HIGH (ref <18)
Troponin I (High Sensitivity): 31 ng/L — ABNORMAL HIGH (ref <18)

## 2021-11-28 LAB — LIPASE, BLOOD: Lipase: 207 U/L — ABNORMAL HIGH (ref 11–51)

## 2021-11-28 MED ORDER — METOPROLOL TARTRATE 5 MG/5ML IV SOLN
5.0000 mg | Freq: Four times a day (QID) | INTRAVENOUS | Status: DC
  Administered 2021-11-29 (×3): 5 mg via INTRAVENOUS
  Filled 2021-11-28 (×3): qty 5

## 2021-11-28 MED ORDER — KETOROLAC TROMETHAMINE 15 MG/ML IJ SOLN
15.0000 mg | Freq: Once | INTRAMUSCULAR | Status: AC
Start: 2021-11-28 — End: 2021-11-28
  Administered 2021-11-28: 15 mg via INTRAVENOUS
  Filled 2021-11-28: qty 1

## 2021-11-28 MED ORDER — ACETAMINOPHEN 650 MG RE SUPP: 650.0000 mg | Freq: Four times a day (QID) | RECTAL | Status: DC | PRN

## 2021-11-28 MED ORDER — ASPIRIN EC 81 MG PO TBEC
81.0000 mg | DELAYED_RELEASE_TABLET | Freq: Every day | ORAL | Status: DC
  Administered 2021-11-28 – 2021-11-30 (×3): 81 mg via ORAL
  Filled 2021-11-28 (×3): qty 1

## 2021-11-28 MED ORDER — ATORVASTATIN CALCIUM 10 MG PO TABS
10.0000 mg | ORAL_TABLET | Freq: Every day | ORAL | Status: DC
  Administered 2021-11-28 – 2021-11-30 (×3): 10 mg via ORAL
  Filled 2021-11-28 (×3): qty 1

## 2021-11-28 MED ORDER — CLONIDINE HCL 0.2 MG/24HR TD PTWK
0.2000 mg | MEDICATED_PATCH | TRANSDERMAL | Status: DC
  Administered 2021-11-28: 0.2 mg via TRANSDERMAL
  Filled 2021-11-28: qty 1

## 2021-11-28 MED ORDER — MORPHINE SULFATE (PF) 2 MG/ML IV SOLN
2.0000 mg | INTRAVENOUS | Status: DC | PRN
  Administered 2021-11-28 – 2021-11-29 (×2): 2 mg via INTRAVENOUS
  Filled 2021-11-28 (×2): qty 1

## 2021-11-28 MED ORDER — HYDRALAZINE HCL 20 MG/ML IJ SOLN
10.0000 mg | Freq: Four times a day (QID) | INTRAMUSCULAR | Status: DC | PRN
  Administered 2021-11-28: 10 mg via INTRAVENOUS
  Filled 2021-11-28: qty 1

## 2021-11-28 MED ORDER — PANTOPRAZOLE SODIUM 40 MG IV SOLR
40.0000 mg | Freq: Two times a day (BID) | INTRAVENOUS | Status: DC
  Administered 2021-11-28 – 2021-11-30 (×4): 40 mg via INTRAVENOUS
  Filled 2021-11-28 (×4): qty 10

## 2021-11-28 MED ORDER — SODIUM CHLORIDE 0.9 % IV SOLN: INTRAVENOUS | Status: DC

## 2021-11-28 MED ORDER — METOPROLOL TARTRATE 5 MG/5ML IV SOLN
5.0000 mg | Freq: Once | INTRAVENOUS | Status: AC
  Administered 2021-11-28: 5 mg via INTRAVENOUS
  Filled 2021-11-28: qty 5

## 2021-11-28 MED ORDER — SODIUM CHLORIDE 0.9 % IV BOLUS
1000.0000 mL | Freq: Once | INTRAVENOUS | Status: AC
  Administered 2021-11-28: 1000 mL via INTRAVENOUS

## 2021-11-28 MED ORDER — ACETAMINOPHEN 325 MG PO TABS: 650.0000 mg | ORAL_TABLET | Freq: Four times a day (QID) | ORAL | Status: DC | PRN

## 2021-11-28 MED ORDER — AMLODIPINE BESYLATE 5 MG PO TABS
5.0000 mg | ORAL_TABLET | Freq: Every day | ORAL | Status: DC
  Administered 2021-11-28 – 2021-11-29 (×2): 5 mg via ORAL
  Filled 2021-11-28 (×2): qty 1

## 2021-11-28 MED ORDER — CARVEDILOL 25 MG PO TABS: 25.0000 mg | ORAL_TABLET | Freq: Two times a day (BID) | ORAL | Status: DC

## 2021-11-28 MED ORDER — HEPARIN SODIUM (PORCINE) 5000 UNIT/ML IJ SOLN
5000.0000 [IU] | Freq: Three times a day (TID) | INTRAMUSCULAR | Status: DC
  Administered 2021-11-28 – 2021-11-29 (×4): 5000 [IU] via SUBCUTANEOUS
  Filled 2021-11-28 (×4): qty 1

## 2021-11-28 MED ORDER — PROCHLORPERAZINE EDISYLATE 10 MG/2ML IJ SOLN
10.0000 mg | Freq: Four times a day (QID) | INTRAMUSCULAR | Status: DC | PRN
  Administered 2021-11-28 – 2021-11-30 (×4): 10 mg via INTRAVENOUS
  Filled 2021-11-28 (×5): qty 2

## 2021-11-28 MED ORDER — FAMOTIDINE IN NACL 20-0.9 MG/50ML-% IV SOLN
20.0000 mg | Freq: Once | INTRAVENOUS | Status: AC
Start: 2021-11-28 — End: 2021-11-28
  Administered 2021-11-28: 20 mg via INTRAVENOUS
  Filled 2021-11-28: qty 50

## 2021-11-28 MED ORDER — SODIUM BICARBONATE 8.4 % IV SOLN
INTRAVENOUS | Status: DC
  Filled 2021-11-28 (×2): qty 1000
  Filled 2021-11-28 (×3): qty 150

## 2021-11-28 MED ORDER — ONDANSETRON HCL 4 MG/2ML IJ SOLN
4.0000 mg | Freq: Once | INTRAMUSCULAR | Status: AC
Start: 2021-11-28 — End: 2021-11-28
  Administered 2021-11-28: 4 mg via INTRAVENOUS
  Filled 2021-11-28: qty 2

## 2021-11-28 NOTE — Assessment & Plan Note (Signed)
Continue on statin °

## 2021-11-28 NOTE — Assessment & Plan Note (Addendum)
Related to volume depletion from GI losses ?Started on IV fluids containing bicarbonate ?Creatinine 2.4 on admission ?Improved to 1.9 today ?Continue IV fluids until we know she can tolerate po intake ?No reported obstructing renal stone on CT ?

## 2021-11-28 NOTE — Assessment & Plan Note (Addendum)
Improving with IV fluids ?

## 2021-11-28 NOTE — ED Triage Notes (Signed)
Pt reports 1 week of N/V/D along w/ poor PO intake.  ?

## 2021-11-28 NOTE — ED Provider Notes (Signed)
4:27 PM ?Patient signed out to me by previous ED physician.  Patient is a 71 year old female presenting for nausea, vomiting, and decreased p.o. intake.  Recently started on antibiotics for urinary tract infection. ? ? ?Physical Exam  ?BP (!) 171/97 (BP Location: Left Arm)   Pulse 81   Temp 98.2 ?F (36.8 ?C) (Oral)   Resp 15   SpO2 100%  ? ?Physical Exam ?Vitals and nursing note reviewed.  ?Constitutional:   ?   General: She is not in acute distress. ?   Appearance: She is well-developed.  ?HENT:  ?   Head: Normocephalic and atraumatic.  ?Eyes:  ?   Conjunctiva/sclera: Conjunctivae normal.  ?Cardiovascular:  ?   Rate and Rhythm: Normal rate and regular rhythm.  ?   Heart sounds: No murmur heard. ?Pulmonary:  ?   Effort: Pulmonary effort is normal. No respiratory distress.  ?   Breath sounds: Normal breath sounds.  ?Abdominal:  ?   Palpations: Abdomen is soft.  ?   Tenderness: There is generalized abdominal tenderness.  ?Musculoskeletal:     ?   General: No swelling.  ?   Cervical back: Neck supple.  ?Skin: ?   General: Skin is warm and dry.  ?   Capillary Refill: Capillary refill takes less than 2 seconds.  ?Neurological:  ?   Mental Status: She is alert.  ?Psychiatric:     ?   Mood and Affect: Mood normal. Affect is tearful.  ? ? ?Procedures  ?Procedures ? ?ED Course / MDM  ?  ?Medical Decision Making ?Amount and/or Complexity of Data Reviewed ?Labs: ordered. ?Radiology: ordered. ? ?Risk ?Prescription drug management. ?Decision regarding hospitalization. ? ? ?Laboratory studies so far demonstrate elevated lipase of 207.  Elevated BUN and acute kidney injury likely secondary to dehydration from vomiting and inability to tolerate anything by mouth for the past 3 days. ? ?Patient recently diagnosed with urinary tract infection.  No signs or symptoms of sepsis.  No obstructing stone on renal study. ? ?Recommended for admission for likely pancreatitis, dehydration, weight loss, and inability to tolerate p.o.  Thus  far IV fluids, Reglan, Benadryl, and Zofran given.  Patient admits to improvement of symptoms at this time however still unable to tolerate p.o. accepted by admitting provider Dr. Roderic Palau. ? ? ? ? ?  ?Lianne Cure, DO ?39/03/00 1749 ? ?

## 2021-11-28 NOTE — Assessment & Plan Note (Addendum)
She had been unable to take her medications ?She takes Coreg, amlodipine, lisinopril, clonidine ?We will hold lisinopril in light of rising creatinine ?Since overall symptoms improving, will restart coreg and oral clonidine ?Continue amlodipine ?Use hydralazine IV as needed ? ?

## 2021-11-28 NOTE — ED Notes (Signed)
Patient transported to CT via stretcher.

## 2021-11-28 NOTE — Assessment & Plan Note (Addendum)
Possibly gastroenteritis vs. Adverse effect of bactrim ?She has not had any bowel movements since admission, making C diff or viral gastroenteritis less likely ?Will discontinue stool studies including C diff and GI pathogen panel as well as enteric precautions ?Treat nausea supportively with antiemetics ?Advance diet as tolerated ?

## 2021-11-28 NOTE — ED Provider Notes (Signed)
?[place] DEPT ?Provider Note ? ? ?CSN: [id number] ?Arrival date & time: 11/28/21  1231 ? ?  ? ?History ? ?Chief Complaint  ?Patient presents with  ? Abdominal Pain  ? ? ?Tamara Friedman is a 71 y.o. female. ? ? Patient as above with significant medical history as below, including HTN, HLD who presents to the ED with complaint of abdominal pain, nausea, vomiting, poor p.o. intake.  Reports she was recently seen by PCP, started on antibiotics for UTI.  After starting antibiotics has been having nausea and vomiting abdominal pain, unable to take antibiotics.  She has some mild dysuria and increased urinary frequency.  No fevers or chills.  Abdominal pain to her bilateral lower quadrants, left greater than right.  No chest pain or dyspnea.  Has been having some diarrhea.  No melena or bright blood per rectum.  Last episode emesis was approximate 3 to 4 hours ago. Fatigue last few days. Poor PO, reports she will vomit shortly after eating or drinking.  Minimal PO intake last few days.  ? ? ? ? ? ?Past Medical History: ?No date: High cholesterol ?No date: Hypertension ? ?Past Surgical History: ?No date: PARTIAL HYSTERECTOMY  ? ? ?The history is provided by the patient. No language interpreter was used.  ?Abdominal Pain ?Associated symptoms: dysuria, fatigue, nausea and vomiting   ?Associated symptoms: no chest pain, no chills, no cough, no fever, no hematuria and no shortness of breath   ? ?  ? ?Home Medications ?Prior to Admission medications   ?Medication Sig Start Date End Date Taking? Authorizing Provider  ?aspirin 81 MG tablet Take 81 mg by mouth daily.     [provider]  ?atorvastatin (LIPITOR) 10 MG tablet Take 10 mg by mouth daily.    [provider]  ?carvedilol (COREG) 25 MG tablet Take 25 mg by mouth 2 (two) times daily with a meal.    [provider]  ?cloNIDine (CATAPRES) 0.1 MG tablet Take 1 tablet (0.1 mg total) by mouth 3 (three) times daily.  04/29/21   [name], MD  ?ibuprofen (ADVIL) 200 MG tablet Take 400 mg by mouth every 6 (six) hours as needed for moderate pain.    [provider]  ?lisinopril (ZESTRIL) 40 MG tablet Take 1 tablet (40 mg total) by mouth daily. 04/29/21   [name], MD  ?ondansetron (ZOFRAN-ODT) 8 MG disintegrating tablet Take 1 tablet (8 mg total) by mouth every 8 (eight) hours as needed for nausea or vomiting. 11/02/21   Molpus, Jenny Reichmann, MD  ?   ? ?Allergies    ?Patient has no known allergies.   ? ?Review of Systems   ?Review of Systems  ?Constitutional:  Positive for fatigue. Negative for chills and fever.  ?HENT:  Negative for facial swelling and trouble swallowing.   ?Eyes:  Negative for photophobia and visual disturbance.  ?Respiratory:  Negative for cough and shortness of breath.   ?Cardiovascular:  Negative for chest pain and palpitations.  ?Gastrointestinal:  Positive for abdominal pain, nausea and vomiting.  ?Endocrine: Negative for polydipsia and polyuria.  ?Genitourinary:  Positive for dysuria and frequency. Negative for difficulty urinating and hematuria.  ?Musculoskeletal:  Negative for gait problem and joint swelling.  ?Skin:  Negative for pallor and rash.  ?Neurological:  Negative for syncope and headaches.  ?Psychiatric/Behavioral:  Negative for agitation and confusion.   ? ?Physical Exam ?Updated Vital Signs ?BP (!) 171/97 (BP Location: Left Arm)   Pulse 81  Temp 98.2 ?F (36.8 ?C) (Oral)   Resp 15   SpO2 100%  ?Physical Exam ?Vitals and nursing note reviewed.  ?Constitutional:   ?   General: She is not in acute distress. ?   Appearance: Normal appearance. She is well-developed. She is obese. She is not ill-appearing or diaphoretic.  ?HENT:  ?   Head: Normocephalic and atraumatic.  ?   Right Ear: External ear normal.  ?   Left Ear: External ear normal.  ?   Nose: Nose normal.  ?   Mouth/Throat:  ?   Mouth: Mucous membranes are moist.  ?Eyes:  ?   General: No scleral icterus.    ?   Right eye: No  discharge.     ?   Left eye: No discharge.  ?Cardiovascular:  ?   Rate and Rhythm: Normal rate and regular rhythm.  ?   Pulses: Normal pulses.  ?   Heart sounds: Normal heart sounds.  ?Pulmonary:  ?   Effort: Pulmonary effort is normal. No respiratory distress.  ?   Breath sounds: Normal breath sounds.  ?Abdominal:  ?   General: Abdomen is flat.  ?   Palpations: Abdomen is soft.  ?   Tenderness: There is abdominal tenderness in the right lower quadrant, suprapubic area and left lower quadrant. There is no guarding or rebound.  ?Musculoskeletal:     ?   General: Normal range of motion.  ?   Cervical back: Normal range of motion.  ?   Right lower leg: No edema.  ?   Left lower leg: No edema.  ?Skin: ?   General: Skin is warm and dry.  ?   Capillary Refill: Capillary refill takes less than 2 seconds.  ?Neurological:  ?   Mental Status: She is alert.  ?Psychiatric:     ?   Mood and Affect: Mood normal.     ?   Behavior: Behavior normal.  ? ? ?ED Results / Procedures / Treatments   ?Labs ?(all labs ordered are listed, but only abnormal results are displayed) ?Labs Reviewed  ?LIPASE, BLOOD - Abnormal; Notable for the following components:  ?    Result Value  ? Lipase 207 (*)   ? All other components within normal limits  ?COMPREHENSIVE METABOLIC PANEL - Abnormal; Notable for the following components:  ? Chloride 114 (*)   ? CO2 17 (*)   ? Glucose, Bld 144 (*)   ? BUN 38 (*)   ? Creatinine, Ser 2.41 (*)   ? Total Protein 8.2 (*)   ? GFR, Estimated 21 (*)   ? All other components within normal limits  ?CBC - Abnormal; Notable for the following components:  ? RDW 17.2 (*)   ? All other components within normal limits  ?URINALYSIS, ROUTINE W REFLEX MICROSCOPIC - Abnormal; Notable for the following components:  ? Hgb urine dipstick SMALL (*)   ? Protein, ur 30 (*)   ? All other components within normal limits  ?TROPONIN I (HIGH SENSITIVITY) - Abnormal; Notable for the following components:  ? Troponin I (High Sensitivity) 31  (*)   ? All other components within normal limits  ?TROPONIN I (HIGH SENSITIVITY) - Abnormal; Notable for the following components:  ? Troponin I (High Sensitivity) 31 (*)   ? All other components within normal limits  ? ? ?EKG ?None ? ?Radiology ?DG Chest Portable 1 View ? ?Result Date: 11/28/2021 ?CLINICAL DATA:  Nausea, vomiting and diarrhea for 1 week. History of  hypertension. EXAM: PORTABLE CHEST 1 VIEW COMPARISON:  01/12/2021 FINDINGS: Heart size and mediastinal contours are unremarkable. Hiatal hernia is again noted. No signs of pleural effusion or edema. No airspace opacities. The visualized osseous structures appear intact. IMPRESSION: 1. No active cardiopulmonary abnormalities. 2. Hiatal hernia. Electronically Signed   By: Kerby Moors M.D.   On: 11/28/2021 13:19  ? ?CT RENAL STONE STUDY ? ?Result Date: 11/28/2021 ?CLINICAL DATA:  Abdominal pain, acute, nonlocalized generalized pain, n/v/d. Left-sided pain EXAM: CT ABDOMEN AND PELVIS WITHOUT CONTRAST TECHNIQUE: Multidetector CT imaging of the abdomen and pelvis was performed following the standard protocol without IV contrast. RADIATION DOSE REDUCTION: This exam was performed according to the departmental dose-optimization program which includes automated exposure control, adjustment of the mA and/or kV according to patient size and/or use of iterative reconstruction technique. COMPARISON:  11/01/2021, 07/19/2013, 07/24/2013 FINDINGS: Lower chest: No acute abnormality. Hepatobiliary: Unremarkable unenhanced appearance of the liver. No focal liver lesion identified. Gallbladder within normal limits. No hyperdense gallstone. No biliary dilatation. Pancreas: Unremarkable. No pancreatic ductal dilatation or surrounding inflammatory changes. Spleen: Normal in size without focal abnormality. Adrenals/Urinary Tract: Unremarkable adrenal glands. 2.2 cm hyperdense lesion in the interpolar region of the left kidney, stable from prior, and previously characterized as  a cyst by ultrasound. No follow-up needed. No evidence of a solid renal mass. No renal stone or hydronephrosis. Urinary bladder is within normal limits. Stomach/Bowel: Moderate hiatal hernia. Appendix appears no

## 2021-11-28 NOTE — Assessment & Plan Note (Addendum)
Noted to have elevated lipase ?No significant CT findings around pancreas, although this was a noncontrast study ?RUQ ultrasound without evidence of cholelithiasis ?Other etiologies include gastroenteritis vs. Adverse effect of antibiotics ?Treating supportively with bowel rest, antiemetics, IV fluids and pain management ?Appears to be improving, advance to soft diet today ?

## 2021-11-28 NOTE — H&P (Signed)
?History and Physical  ? ? ?Patient: Tamara Friedman IPJ:[id number] DOB: [birth year] ?DOA: 11/28/2021 ?DOS: the patient was seen and examined on 11/28/2021 ?PCP: Lucianne Lei, MD  ?Patient coming from: Home ? ?Chief Complaint:  ?Chief Complaint  ?Patient presents with  ? Abdominal Pain  ? ?HPI: Tamara Friedman is a 71 y.o. female with medical history significant of chronic kidney disease stage 4, hypertension, hyperlipidemia who reports that she was recently treated for urinary tract infection by her primary care physician when she had dysuria at that time.  It appears that she was prescribed a course of Bactrim.  She reports completing most of it, but began developing nausea, vomiting and diarrhea.  She said that she has had the symptoms for the past week.  She is unable to keep anything down by mouth.  Reports associated weight loss.  She reports approximately 2-3 loose bowel movements per day.  She is also had 3-4 episodes of vomiting daily.  She has left-sided abdominal pain.  She has not had any blood in her stools.  She has not had any fever.  She does have a cough, no chest pain.  No shortness of breath, occasional chest pain.  She overall feels weak and tired.  She normally lives independently, ambulates independently.  She does not drink alcohol. ? ?She was evaluated in the emergency room where she was noted to be hypertensive.  CT of the abdomen and pelvis did not show any acute abnormalities.  This was a noncontrast study.  Urinalysis did not show any signs of infection.  She did have some metabolic acidosis and acute kidney injury consistent with her vomiting.  Chest x-ray did not show any acute findings.  Lipase noted to be elevated at 207.  EKG showed sinus rhythm with LVH, no acute changes recognized.  Troponin only minimally elevated at 31 and trend was flat.  Hemoglobin was in normal range. ? ?Of note,.  She had similar presentation on 11/01/2021, where she was evaluated in the emergency room for abdominal  pain and vomiting.  CT scan of the abdomen pelvis indicated constipation at that time.  She was treated in the emergency room, was clinically improved and discharged home. ? ?Review of Systems: As mentioned in the history of present illness. All other systems reviewed and are negative. ?Past Medical History:  ?Diagnosis Date  ? High cholesterol   ? Hypertension   ? ?Past Surgical History:  ?Procedure Laterality Date  ? PARTIAL HYSTERECTOMY    ? ?Social History:  reports that she has never smoked. She has never used smokeless tobacco. She reports that she does not drink alcohol and does not use drugs. ? ?No Known Allergies ? ?History reviewed. No pertinent family history. ? ?Prior to Admission medications   ?Medication Sig Start Date End Date Taking? Authorizing Provider  ?amLODipine (NORVASC) 5 MG tablet Take 5 mg by mouth daily. 10/29/21  Yes [provider]  ?aspirin 81 MG tablet Take 81 mg by mouth daily.    Yes [provider]  ?atorvastatin (LIPITOR) 10 MG tablet Take 10 mg by mouth daily.   Yes [provider]  ?carvedilol (COREG) 25 MG tablet Take 25 mg by mouth 2 (two) times daily with a meal.   Yes [provider]  ?cloNIDine (CATAPRES) 0.2 MG tablet Take 0.2 mg by mouth 3 (three) times daily. 11/18/21  Yes [provider]  ?lisinopril (ZESTRIL) 40 MG tablet Take 1 tablet (40 mg total) by mouth daily. 04/29/21  Yes [name], MD  ?Multiple Vitamins-Minerals (MULTIVITAMIN ADULTS) TABS Take 1 tablet by mouth daily.   Yes [provider]  ?ibuprofen (ADVIL) 200 MG tablet Take 400 mg by mouth every 6 (six) hours as needed for moderate pain.    [provider]  ?ondansetron (ZOFRAN-ODT) 8 MG disintegrating tablet Take 1 tablet (8 mg total) by mouth every 8 (eight) hours as needed for nausea or vomiting. ?Patient not taking: Reported on 11/28/2021 11/02/21   Molpus, Jenny Reichmann, MD  ? ? ?Physical Exam: ?Vitals:  ? 11/28/21 1630 11/28/21 1700 11/28/21 1800  11/28/21 1955  ?BP: (!) 175/92 (!) 171/88 (!) 177/92 (!) 184/97  ?Pulse: 75 79 78 79  ?Resp: '18 18 17 18  '$ ?Temp:    97.6 ?F (36.4 ?C)  ?TempSrc:    Oral  ?SpO2: 99% 99% 100% 100%  ? ?General exam: She is sitting up in bed.  Appears to be uncomfortable.  Dry heaving during my visit. ?Respiratory system: Clear to auscultation. Respiratory effort normal. ?Cardiovascular system:RRR. No murmurs, rubs, gallops. ?Gastrointestinal system: Abdomen is nondistended, soft and diffusely tender. No organomegaly or masses felt. Normal bowel sounds heard. ?Central nervous system: Alert and oriented. No focal neurological deficits. ?Extremities: No C/C/E, +pedal pulses ?Skin: No rashes, lesions or ulcers ?Psychiatry: Judgement and insight appear normal. Mood & affect appropriate.  ? ?Data Reviewed: ? ?As noted above in HPI ? ?Assessment and Plan: ?* Acute pancreatitis ?Noted to have elevated lipase ?No significant CT findings around pancreas, although this was a noncontrast study ?Can check right upper quadrant ultrasound to evaluate for cholelithiasis ?Other etiologies include gastroenteritis ?Treat supportively with bowel rest, antiemetics, IV fluids and pain management ? ?Hyperlipidemia ?Continue on statin ? ?Nausea vomiting and diarrhea ?Possibly gastroenteritis ?She was recently on Bactrim ?Check stool studies for C. difficile and GI pathogen panel ?Treat nausea supportively with antiemetics ?Keep n.p.o. for now until symptoms are improved after which she can be started on clear liquids ? ?Dehydration ?We will start on IV hydration ? ?HTN (hypertension) ?She was has been unable to take her medications ?She takes Coreg, amlodipine, lisinopril, clonidine ?We will hold lisinopril in light of rising creatinine ?Convert clonidine to patch ?Hold Coreg and start on IV metoprolol until she is able to take p.o. ?Use hydralazine IV as needed ?We will try po amlodipine as tolerated ? ?Acute renal failure superimposed on stage 4 chronic  kidney disease ([place]) ?Related to volume depletion from GI losses ?Started on IV fluids containing bicarbonate ?Monitor urine output and renal function ?No reported obstructing renal stone on CT ? ? ? ? ? Advance Care Planning:   Code Status: Full Code  ? ?Consults:  ? ?Family Communication: Discussed with patient ? ?Severity of Illness: ?The appropriate patient status for this patient is OBSERVATION. Observation status is judged to be reasonable and necessary in order to provide the required intensity of service to ensure the patient's safety. The patient's presenting symptoms, physical exam findings, and initial radiographic and laboratory data in the context of their medical condition is felt to place them at decreased risk for further clinical deterioration. Furthermore, it is anticipated that the patient will be medically stable for discharge from the hospital within 2 midnights of admission.  ? ?Author: ?Kathie Dike, MD ?11/28/2021 8:08 PM ? ?For on call review [email or web address].  ?

## 2021-11-29 DIAGNOSIS — N179 Acute kidney failure, unspecified: Secondary | ICD-10-CM | POA: Diagnosis present

## 2021-11-29 DIAGNOSIS — Z79899 Other long term (current) drug therapy: Secondary | ICD-10-CM | POA: Diagnosis not present

## 2021-11-29 DIAGNOSIS — Z7982 Long term (current) use of aspirin: Secondary | ICD-10-CM | POA: Diagnosis not present

## 2021-11-29 DIAGNOSIS — E860 Dehydration: Secondary | ICD-10-CM | POA: Diagnosis present

## 2021-11-29 DIAGNOSIS — N390 Urinary tract infection, site not specified: Secondary | ICD-10-CM | POA: Diagnosis present

## 2021-11-29 DIAGNOSIS — K529 Noninfective gastroenteritis and colitis, unspecified: Secondary | ICD-10-CM | POA: Diagnosis present

## 2021-11-29 DIAGNOSIS — R531 Weakness: Secondary | ICD-10-CM | POA: Diagnosis present

## 2021-11-29 DIAGNOSIS — R112 Nausea with vomiting, unspecified: Secondary | ICD-10-CM | POA: Diagnosis present

## 2021-11-29 DIAGNOSIS — E7800 Pure hypercholesterolemia, unspecified: Secondary | ICD-10-CM | POA: Diagnosis present

## 2021-11-29 DIAGNOSIS — E872 Acidosis, unspecified: Secondary | ICD-10-CM | POA: Diagnosis present

## 2021-11-29 DIAGNOSIS — I129 Hypertensive chronic kidney disease with stage 1 through stage 4 chronic kidney disease, or unspecified chronic kidney disease: Secondary | ICD-10-CM | POA: Diagnosis present

## 2021-11-29 DIAGNOSIS — I10 Essential (primary) hypertension: Secondary | ICD-10-CM | POA: Diagnosis not present

## 2021-11-29 DIAGNOSIS — K859 Acute pancreatitis without necrosis or infection, unspecified: Secondary | ICD-10-CM | POA: Diagnosis present

## 2021-11-29 DIAGNOSIS — T368X5A Adverse effect of other systemic antibiotics, initial encounter: Secondary | ICD-10-CM | POA: Diagnosis present

## 2021-11-29 DIAGNOSIS — N184 Chronic kidney disease, stage 4 (severe): Secondary | ICD-10-CM | POA: Diagnosis present

## 2021-11-29 LAB — COMPREHENSIVE METABOLIC PANEL
ALT: 17 U/L (ref 0–44)
AST: 15 U/L (ref 15–41)
Albumin: 3.1 g/dL — ABNORMAL LOW (ref 3.5–5.0)
Alkaline Phosphatase: 77 U/L (ref 38–126)
Anion gap: 7 (ref 5–15)
BUN: 34 mg/dL — ABNORMAL HIGH (ref 8–23)
CO2: 18 mmol/L — ABNORMAL LOW (ref 22–32)
Calcium: 9 mg/dL (ref 8.9–10.3)
Chloride: 115 mmol/L — ABNORMAL HIGH (ref 98–111)
Creatinine, Ser: 1.95 mg/dL — ABNORMAL HIGH (ref 0.44–1.00)
GFR, Estimated: 27 mL/min — ABNORMAL LOW (ref >60)
Glucose, Bld: 144 mg/dL — ABNORMAL HIGH (ref 70–99)
Potassium: 4.7 mmol/L (ref 3.5–5.1)
Sodium: 140 mmol/L (ref 135–145)
Total Bilirubin: 0.4 mg/dL (ref 0.3–1.2)
Total Protein: 7.3 g/dL (ref 6.5–8.1)

## 2021-11-29 LAB — CBC
HCT: 38.5 % (ref 36.0–46.0)
Hemoglobin: 12 g/dL (ref 12.0–15.0)
MCH: 25.8 pg — ABNORMAL LOW (ref 26.0–34.0)
MCHC: 31.2 g/dL (ref 30.0–36.0)
MCV: 82.6 fL (ref 80.0–100.0)
Platelets: 194 10*3/uL (ref 150–400)
RBC: 4.66 MIL/uL (ref 3.87–5.11)
RDW: 17.1 % — ABNORMAL HIGH (ref 11.5–15.5)
WBC: 8.3 10*3/uL (ref 4.0–10.5)
nRBC: 0 % (ref 0.0–0.2)

## 2021-11-29 MED ORDER — CLONIDINE HCL 0.1 MG PO TABS
0.2000 mg | ORAL_TABLET | Freq: Three times a day (TID) | ORAL | Status: DC
  Administered 2021-11-29 (×2): 0.2 mg via ORAL
  Filled 2021-11-29 (×2): qty 2

## 2021-11-29 MED ORDER — MELATONIN 3 MG PO TABS
3.0000 mg | ORAL_TABLET | Freq: Once | ORAL | Status: AC
  Administered 2021-11-29: 3 mg via ORAL
  Filled 2021-11-29: qty 1

## 2021-11-29 MED ORDER — CARVEDILOL 25 MG PO TABS
25.0000 mg | ORAL_TABLET | Freq: Two times a day (BID) | ORAL | Status: DC
  Administered 2021-11-29: 25 mg via ORAL
  Filled 2021-11-29: qty 1

## 2021-11-29 NOTE — Progress Notes (Signed)
?  Transition of Care (TOC) Screening Note ? ? ?Patient Details  ?Name: Tamara Friedman ?Date of Birth: [birth year] ? ? ?Transition of Care (TOC) CM/SW Contact:    ?Winter Trefz, LCSW ?Phone Number: ?11/29/2021, 11:37 AM ? ? ? ?Transition of Care Department [facility]) has reviewed patient and no TOC needs have been identified at this time. We will continue to monitor patient advancement through interdisciplinary progression rounds. If new patient transition needs arise, please place a TOC consult. ? ? ?

## 2021-11-29 NOTE — Assessment & Plan Note (Signed)
Seen by physical therapy with recommendations for Tamara Friedman PT ?

## 2021-11-29 NOTE — Evaluation (Signed)
Physical Therapy Evaluation ?Patient Details ?Name: Tamara Friedman ?MRN: [id number] ?DOB: January 12, 1951 ?Today's Date: 11/29/2021 ? ?History of Present Illness ? Patient  is 71 YO female with PMH of HTN, HLD ,CHF,.presents to the ED  11/28/21 with complaint of abdominal pain, nausea, vomiting, poor p.o. intake after starting antibiotics for UTI.  ?Clinical Impression ? The patient  presents with generalized weakness, required steady assistance for ambulation( did not have a RW available.)  ?Patient lives alone and ambulatory with cane  at baseline.. patient reports supportive  family. Patient should progress to return home.   ?Pt admitted with above diagnosis.  Pt currently with functional limitations due to the deficits listed below (see PT Problem List). Pt will benefit from skilled PT to increase their independence and safety with mobility to allow discharge to the venue listed below.   ? ?   ? ?Recommendations for follow up therapy are one component of a multi-disciplinary discharge planning process, led by the attending physician.  Recommendations may be updated based on patient status, additional functional criteria and insurance authorization. ? ?Follow Up Recommendations Home health PT ? ?  ?Assistance Recommended at Discharge Intermittent Supervision/Assistance  ?Patient can return home with the following ? A little help with walking and/or transfers;Assistance with cooking/housework;Assist for transportation;Help with stairs or ramp for entrance;A little help with bathing/dressing/bathroom ? ?  ?Equipment Recommendations None recommended by PT  ?Recommendations for Other Services ?    ?  ?Functional Status Assessment Patient has had a recent decline in their functional status and demonstrates the ability to make significant improvements in function in a reasonable and predictable amount of time.  ? ?  ?Precautions / Restrictions Precautions ?Precautions: Fall ?Precaution Comments: gait unsteady  ? ?  ? ?Mobility ?  Bed Mobility ?Overal bed mobility: Modified Independent ?  ?  ?  ?  ?  ?  ?  ?  ? ?Transfers ?Overall transfer level: Needs assistance ?  ?Transfers: Sit to/from Stand ?Sit to Stand: Min assist ?  ?  ?  ?  ?  ?General transfer comment: extra effort to powerup from low surfaces ?  ? ?Ambulation/Gait ?Ambulation/Gait assistance: Min assist ?Gait Distance (Feet): 20 Feet (x2) ?Assistive device: IV Pole, 1 person hand held assist ?Gait Pattern/deviations: Step-to pattern, Antalgic ?Gait velocity: decr ?  ?  ?General Gait Details: knees  flexed, Steady assist to ambulate with IIV pole(no Rw in room) ? ?Stairs ?  ?  ?  ?  ?  ? ?Wheelchair Mobility ?  ? ?Modified Rankin (Stroke Patients Only) ?  ? ?  ? ?Balance Overall balance assessment: Needs assistance ?Sitting-balance support: Feet supported, No upper extremity supported ?Sitting balance-Leahy Scale: Normal ?  ?  ?Standing balance support: During functional activity, No upper extremity supported ?Standing balance-Leahy Scale: Poor ?Standing balance comment: required HHA or rail to stand and perform pericare. ?  ?  ?  ?  ?  ?  ?  ?  ?  ?  ?  ?   ? ? ? ?Pertinent Vitals/Pain Pain Assessment ?Pain Assessment: No/denies pain  ? ? ?Home Living Family/patient expects to be discharged to:: Private residence ?Living Arrangements: Alone;Other relatives ?Available Help at Discharge: Family;Available PRN/intermittently ?Type of Home: Apartment ?Home Access: Stairs to enter ?Entrance Stairs-Rails: None ?Entrance Stairs-Number of Steps: 1 ?  ?Home Layout: One level ?Home Equipment: Cane - single point;Rollator (4 wheels) ?   ?  ?Prior Function Prior Level of Function : Independent/Modified Independent ?  ?  ?  ?  ?  ?  ?  Mobility Comments: does not drive, grandson assists with groceries and transportation, uses a cane mostly ?ADLs Comments: IADL's ?  ? ? ?Hand Dominance  ?   ? ?  ?Extremity/Trunk Assessment  ? Upper Extremity Assessment ?Upper Extremity Assessment: Overall WFL for  tasks assessed ?  ? ?Lower Extremity Assessment ?Lower Extremity Assessment: Generalized weakness ?  ? ?Cervical / Trunk Assessment ?Cervical / Trunk Assessment: Normal  ?Communication  ? Communication: No difficulties  ?Cognition Arousal/Alertness: Awake/alert ?Behavior During Therapy: [facility] for tasks assessed/performed ?Overall Cognitive Status: Within Functional Limits for tasks assessed ?  ?  ?  ?  ?  ?  ?  ?  ?  ?  ?  ?  ?  ?  ?  ?  ?  ?  ?  ? ?  ?General Comments   ? ?  ?Exercises    ? ?Assessment/Plan  ?  ?PT Assessment Patient needs continued PT services  ?PT Problem List Decreased strength;Decreased mobility;Decreased activity tolerance;Decreased balance ? ?   ?  ?PT Treatment Interventions DME instruction;Therapeutic activities;Gait training;Therapeutic exercise;Patient/family education;Functional mobility training   ? ?PT Goals (Current goals can be found in the Care Plan section)  ?Acute Rehab PT Goals ?Patient Stated Goal: to go home ?PT Goal Formulation: With patient ?Time For Goal Achievement: 12/13/21 ?Potential to Achieve Goals: Good ? ?  ?Frequency Min 3X/week ?  ? ? ?Co-evaluation   ?  ?  ?  ?  ? ? ?  ?AM-PAC PT "6 Clicks" Mobility  ?Outcome Measure Help needed turning from your back to your side while in a flat bed without using bedrails?: None ?Help needed moving from lying on your back to sitting on the side of a flat bed without using bedrails?: None ?Help needed moving to and from a bed to a chair (including a wheelchair)?: A Little ?Help needed standing up from a chair using your arms (e.g., wheelchair or bedside chair)?: A Little ?Help needed to walk in hospital room?: A Lot ?Help needed climbing 3-5 steps with a railing? : A Lot ?6 Click Score: 18 ? ?  ?End of Session   ?Activity Tolerance: Patient limited by fatigue ?Patient left: in chair;with call bell/phone within reach;with chair alarm set ?Nurse Communication: Mobility status ?PT Visit Diagnosis: Unsteadiness on feet  (R26.81);Difficulty in walking, not elsewhere classified (R26.2) ?  ? ?Time: 6759-1638 ?PT Time Calculation (min) (ACUTE ONLY): 26 min ? ? ?Charges:   PT Evaluation ?$PT Eval Low Complexity: 1 Low ?PT Treatments ?$Gait Training: 8-22 mins ?  ?   ? ? ?Tresa Endo PT ?Acute Rehabilitation Services ?Pager [phone] ?Office [phone] ? ? ?Babita Amaker, Tamara Maxim ?11/29/2021, 12:46 PM ? ?

## 2021-11-29 NOTE — Plan of Care (Signed)

## 2021-11-29 NOTE — Progress Notes (Signed)
?Progress Note ? ? ?Patient: Tamara Friedman BSW:[id number] DOB: 1950-08-02 DOA: 11/28/2021     0 ?DOS: the patient was seen and examined on 11/29/2021 ?  ?Brief hospital course: ?71 year old female with a history of chronic kidney disease stage IV, hypertension, admitted to the hospital with persistent nausea and vomiting as well as loose stools.  She was noted to be dehydrated and had acute kidney injury.  CT of the abdomen did not have any acute findings.  Lipase was noted to be elevated at 207.  She was treated with antiemetics, IV fluids and placed on bowel rest. ? ?Assessment and Plan: ?* Acute pancreatitis ?Noted to have elevated lipase ?No significant CT findings around pancreas, although this was a noncontrast study ?RUQ ultrasound without evidence of cholelithiasis ?Other etiologies include gastroenteritis vs. Adverse effect of antibiotics ?Treating supportively with bowel rest, antiemetics, IV fluids and pain management ?Appears to be improving, advance to soft diet today ? ?Generalized weakness ?Seen by physical therapy with recommendations for [facility] PT ? ?Hyperlipidemia ?Continue on statin ? ?Nausea vomiting and diarrhea ?Possibly gastroenteritis vs. Adverse effect of bactrim ?She has not had any bowel movements since admission, making C diff or viral gastroenteritis less likely ?Will discontinue stool studies including C diff and GI pathogen panel as well as enteric precautions ?Treat nausea supportively with antiemetics ?Advance diet as tolerated ? ?Dehydration ?Improving with IV fluids ? ?HTN (hypertension) ?She had been unable to take her medications ?She takes Coreg, amlodipine, lisinopril, clonidine ?We will hold lisinopril in light of rising creatinine ?Since overall symptoms improving, will restart coreg and oral clonidine ?Continue amlodipine ?Use hydralazine IV as needed ? ? ?Acute renal failure superimposed on stage 4 chronic kidney disease ([place]) ?Related to volume depletion from GI losses ?Started  on IV fluids containing bicarbonate ?Creatinine 2.4 on admission ?Improved to 1.9 today ?Continue IV fluids until we know she can tolerate po intake ?No reported obstructing renal stone on CT ? ? ? ? ?  ? ?Subjective: Feeling better today.  No bowel movements since admission.  She has not had any vomiting, still has some nausea.  Tolerating some liquids.  Would like to advance diet. ? ?Physical Exam: ?Vitals:  ? 11/29/21 0400 11/29/21 0456 11/29/21 0808 11/29/21 1233  ?BP:  140/73 (!) 162/84 (!) 145/90  ?Pulse:  74 71 72  ?Resp:  '16 18 18  '$ ?Temp:  98.5 ?F (36.9 ?C) (!) 97.5 ?F (36.4 ?C) (!) 97.5 ?F (36.4 ?C)  ?TempSrc:  Oral Oral Oral  ?SpO2:  100% 100% 100%  ?Weight: 72.6 kg     ?Height: '5\' 6"'$  (1.676 m)     ? ?General exam: Alert, awake, oriented x 3 ?Respiratory system: Clear to auscultation. Respiratory effort normal. ?Cardiovascular system:RRR. No murmurs, rubs, gallops. ?Gastrointestinal system: Abdomen is nondistended, soft and nontender. No organomegaly or masses felt. Normal bowel sounds heard. ?Central nervous system: Alert and oriented. No focal neurological deficits. ?Extremities: No C/C/E, +pedal pulses ?Skin: No rashes, lesions or ulcers ?Psychiatry: Judgement and insight appear normal. Mood & affect appropriate.  ? ?Data Reviewed: ? ?Creatinine trending down with IV fluids 1.9.  Still has known anion gap metabolic acidosis with bicarb of 18.  CBC unremarkable.  Right upper quadrant ultrasound without any cholelithiasis. ? ?Family Communication: No family present ? ?Disposition: ?Status is: Inpatient ?Remains inpatient appropriate because: Continued IV fluids for volume depletion and advancing diet to ensure she can tolerate oral intake ? Planned Discharge Destination: Home with Home Health ? ? ? ?  Time spent: 35 minutes ? ?Author: ?Kathie Dike, MD ?11/29/2021 3:07 PM ? ?For on call review [email or web address].  ?

## 2021-11-29 NOTE — Hospital Course (Signed)
71 year old female with a history of chronic kidney disease stage IV, hypertension, admitted to the hospital with persistent nausea and vomiting as well as loose stools.  She was noted to be dehydrated and had acute kidney injury.  CT of the abdomen did not have any acute findings.  Lipase was noted to be elevated at 207.  She was treated with antiemetics, IV fluids and placed on bowel rest. ?

## 2021-11-30 LAB — BASIC METABOLIC PANEL
Anion gap: 8 (ref 5–15)
BUN: 31 mg/dL — ABNORMAL HIGH (ref 8–23)
CO2: 27 mmol/L (ref 22–32)
Calcium: 8.1 mg/dL — ABNORMAL LOW (ref 8.9–10.3)
Chloride: 102 mmol/L (ref 98–111)
Creatinine, Ser: 2.25 mg/dL — ABNORMAL HIGH (ref 0.44–1.00)
GFR, Estimated: 23 mL/min — ABNORMAL LOW (ref >60)
Glucose, Bld: 151 mg/dL — ABNORMAL HIGH (ref 70–99)
Potassium: 3.8 mmol/L (ref 3.5–5.1)
Sodium: 137 mmol/L (ref 135–145)

## 2021-11-30 LAB — LIPASE, BLOOD: Lipase: 310 U/L — ABNORMAL HIGH (ref 11–51)

## 2021-11-30 MED ORDER — PANTOPRAZOLE SODIUM 40 MG PO TBEC: 40.0000 mg | DELAYED_RELEASE_TABLET | Freq: Every day | ORAL | 1 refills | Status: DC

## 2021-11-30 MED ORDER — ONDANSETRON 8 MG PO TBDP
8.0000 mg | ORAL_TABLET | Freq: Three times a day (TID) | ORAL | 0 refills | Status: DC | PRN
Start: 2021-11-30 — End: 2022-06-04

## 2021-11-30 NOTE — Discharge Summary (Addendum)
Physician Discharge Summary  ?Tamara Friedman JTT:[id number] DOB: Oct 02, 1950 DOA: 11/28/2021 ? ?PCP: Lucianne Lei, MD ? ?Admit date: 11/28/2021 ?Discharge date: 11/30/2021 ? ?Admitted From: Home ?Disposition: Home ? ?Recommendations for Outpatient Follow-up:  ?Follow up with PCP in 1-2 weeks ?Please obtain BMP/CBC in one week ?ACE inhibitor discontinued due to elevated creatinine ? ? ?Home Health: Home health PT ?Equipment/Devices: ? ?Discharge Condition: Stable ?CODE STATUS: Full code ?Diet recommendation: Heart healthy ? ?Brief/Interim Summary: ?71 year old female with a history of chronic kidney disease stage IV, hypertension, admitted to the hospital with persistent nausea and vomiting as well as loose stools.  She was noted to be dehydrated and had acute kidney injury.  CT of the abdomen did not have any acute findings.  Lipase was noted to be elevated at 207.  She was treated with antiemetics, IV fluids and placed on bowel rest. ? ?Discharge Diagnoses:  ?Principal Problem: ?  Acute pancreatitis ?Active Problems: ?  Acute renal failure superimposed on stage 4 chronic kidney disease ([place]) ?  HTN (hypertension) ?  Dehydration ?  Nausea vomiting and diarrhea ?  Hyperlipidemia ?  AKI (acute kidney injury) ([place]) ?  Generalized weakness ? ?Acute pancreatitis ?Noted to have elevated lipase ?No significant CT findings around pancreas, although this was a noncontrast study ?RUQ ultrasound without evidence of cholelithiasis ?Other etiologies include gastroenteritis vs. Adverse effect of antibiotics ?Treating supportively with bowel rest, antiemetics, IV fluids and pain management ?Diet was advanced to solid food and she did not have any recurrence of vomiting. ?  ?Generalized weakness ?Seen by physical therapy with recommendations for [facility] PT ?  ?Hyperlipidemia ?Continue on statin ?  ?Nausea vomiting and diarrhea ?Possibly gastroenteritis vs. Adverse effect of bactrim ?She has not had any bowel movements since admission, making C  diff or viral gastroenteritis less likely ?Treat nausea supportively with antiemetics ?Advance diet as tolerated ?At the time of discharge, she was able to tolerate solid diet without any vomiting and was not having any diarrhea ?  ?Dehydration ?Improving with IV fluids ?  ?HTN (hypertension) ?She had been unable to take her medications ?She takes Coreg, amlodipine, lisinopril, clonidine ?We will hold lisinopril in light of rising creatinine ?Once her GI symptoms improve, she was restarted on the remainder of her antihypertensives and her blood pressure remained stable ? ?  ?  ?Acute renal failure superimposed on stage 4 chronic kidney disease ([place]) ?Related to volume depletion from GI losses ?Started on IV fluids containing bicarbonate ?Creatinine 2.4 on admission ?Baseline creatinine 1.8-2 ?Renal function improved with IV fluids ?No reported obstructing renal stone on CT ? ?Discharge Instructions ? ?Discharge Instructions   ? ? Diet - low sodium heart healthy   Complete by: As directed ?  ? Increase activity slowly   Complete by: As directed ?  ? ?  ? ?Allergies as of 11/30/2021   ?No Known Allergies ?  ? ?  ?Medication List  ?  ? ?STOP taking these medications   ? ?ibuprofen 200 MG tablet ?Commonly known as: ADVIL ?  ?lisinopril 40 MG tablet ?Commonly known as: ZESTRIL ?  ? ?  ? ?TAKE these medications   ? ?amLODipine 5 MG tablet ?Commonly known as: NORVASC ?Take 5 mg by mouth daily. ?  ?aspirin 81 MG tablet ?Take 81 mg by mouth daily. ?  ?atorvastatin 10 MG tablet ?Commonly known as: LIPITOR ?Take 10 mg by mouth daily. ?  ?carvedilol 25 MG tablet ?Commonly known as: COREG ?Take 25 mg by mouth  2 (two) times daily with a meal. ?  ?cloNIDine 0.2 MG tablet ?Commonly known as: CATAPRES ?Take 0.2 mg by mouth 3 (three) times daily. ?  ?Multivitamin Adults Tabs ?Take 1 tablet by mouth daily. ?  ?ondansetron 8 MG disintegrating tablet ?Commonly known as: ZOFRAN-ODT ?Take 1 tablet (8 mg total) by mouth every 8 (eight) hours  as needed for nausea or vomiting. ?  ?pantoprazole 40 MG tablet ?Commonly known as: Protonix ?Take 1 tablet (40 mg total) by mouth daily. ?  ? ?  ? ? ?No Known Allergies ? ?Consultations: ? ? ? ?Procedures/Studies: ?CT ABDOMEN PELVIS W CONTRAST ? ?Result Date: 11/01/2021 ?CLINICAL DATA:  Abdominal pain, vomiting and hypertension. EXAM: CT ABDOMEN AND PELVIS WITH CONTRAST TECHNIQUE: Multidetector CT imaging of the abdomen and pelvis was performed using the standard protocol following bolus administration of intravenous contrast. RADIATION DOSE REDUCTION: This exam was performed according to the departmental dose-optimization program which includes automated exposure control, adjustment of the mA and/or kV according to patient size and/or use of iterative reconstruction technique. CONTRAST:  43m OMNIPAQUE IOHEXOL 300 MG/ML  SOLN COMPARISON:  CTs without contrast 04/27/2021, 01/12/2021. FINDINGS: Lower chest: Moderate-sized hiatal hernia with about half of the stomach intrathoracic, stable. Circumferential wall prominence in the mid to distal stomach is also similar. There is atelectasis in the lower lobes without infiltrates. Mild elevation right hemidiaphragm. Cardiac size is normal and there are coronary artery calcifications Hepatobiliary: No focal liver abnormality is seen. No calcified gallstones, gallbladder wall thickening, or biliary dilatation. Pancreas: Unremarkable. Spleen: Unremarkable. Adrenals/Urinary Tract: There is no adrenal mass. There is cortical scarring in the left kidney. There are small left renal cysts. There are bilateral subcentimeter too small to characterize hypodensities in the renal cortex. There is no mass enhancement, stones or hydronephrosis. No bladder thickening. Stomach/Bowel: As above circumferential thickening of the mid to distal stomach is similar to prior studies. The unopacified small bowel is unremarkable. The appendix is normal caliber. There is moderate stool retention  ascending and transverse colon, multiple diverticula left-sided colon. No stranding in the adjacent mesentery is seen suspicious for acute diverticulitis. There is a nonobstructed short loop of small bowel extending into a nonobstructing umbilical hernia. Vascular/Lymphatic: Aortic atherosclerosis. No enlarged abdominal or pelvic lymph nodes. There are multiple pelvic phleboliths. Reproductive: Status post hysterectomy. No adnexal masses. Other: Umbilical hernia contains a nonobstructed short loop of the small bowel. This was seen previously. There is a small right inguinal fat hernia. There is no free air, hemorrhage or fluid. Musculoskeletal: There is mild osteopenia with degenerative changes of the lumbar facet joints and both hips, spurring of the SI joints, with no fractures or suspicious focal lesions. IMPRESSION: 1. No acute abdominopelvic abnormality is seen. 2. Constipation and diverticulosis. 3. Hiatal hernia with chronically thickened mid to distal stomach. 4. Umbilical hernia containing a nonobstructed short small bowel segment, unchanged. No evidence of bowel strangulation or obstruction. 5. Left renal cysts and scarring, and several bilateral too small to characterize renal cortical hypodensities. 6. Aortic and coronary artery atherosclerosis and remaining findings described above. Electronically Signed   By: KTelford NabM.D.   On: 11/01/2021 23:03  ? ?DG Chest Portable 1 View ? ?Result Date: 11/28/2021 ?CLINICAL DATA:  Nausea, vomiting and diarrhea for 1 week. History of hypertension. EXAM: PORTABLE CHEST 1 VIEW COMPARISON:  01/12/2021 FINDINGS: Heart size and mediastinal contours are unremarkable. Hiatal hernia is again noted. No signs of pleural effusion or edema. No airspace opacities. The visualized osseous  structures appear intact. IMPRESSION: 1. No active cardiopulmonary abnormalities. 2. Hiatal hernia. Electronically Signed   By: Kerby Moors M.D.   On: 11/28/2021 13:19  ? ?CT RENAL STONE  STUDY ? ?Result Date: 11/28/2021 ?CLINICAL DATA:  Abdominal pain, acute, nonlocalized generalized pain, n/v/d. Left-sided pain EXAM: CT ABDOMEN AND PELVIS WITHOUT CONTRAST TECHNIQUE: Multidetector CT imagi

## 2021-11-30 NOTE — TOC Transition Note (Signed)
Transition of Care (TOC) - CM/SW Discharge Note ? ? ?Patient Details  ?Name: TRANNIE BARDALES ?MRN: [id number] ?Date of Birth: 1951-01-17 ? ?Transition of Care (TOC) CM/SW Contact:  ?Ross Ludwig, LCSW ?Phone Number: ?11/30/2021, 1:55 PM ? ? ?Clinical Narrative:    ?Patient did not have a preference for agency.  CSW spoke to [place] they can accept patient for [facility] PT.  Patient will be going home with home health through [employer] signing off please reconsult with any other social work needs, home health agency has been notified of planned discharge. ? ? ? ?Final next level of care: [place] ?Barriers to Discharge: Barriers Resolved ? ? ?Patient Goals and CMS Choice ?  ?  ?Choice offered to / list presented to : Patient ? ?Discharge Placement ? Home with home health ?           ?  ?  ?  ?  ? ?Discharge Plan and Services ? Home with home health. ?  ?           ?  ?  ?  ?  ?  ?HH Arranged: PT ?[place] Agency: Other - See comment Therapist, music) ?Date HH Agency Contacted: 11/30/21 ?  ?  ? ?Social Determinants of Health (SDOH) Interventions ?  ? ? ?Readmission Risk Interventions ?   ? View : No data to display.  ?  ?  ?  ? ? ? ? ? ?

## 2021-11-30 NOTE — Plan of Care (Signed)

## 2021-11-30 NOTE — Progress Notes (Signed)
Patient will be discharging home with family. Belongings were returned. Education on medication will be provided.  ?

## 2021-11-30 NOTE — Progress Notes (Signed)
Occupational Therapy Evaluation ? ?Patient reports grandson lives with her and she is independent at baseline with self care tasks. Uses a walker at home. Patient demonstrates ability to ambulate to/from bathroom, transfer on/off toilet and stand at sink for grooming and hygiene tasks without physical assistance. Min cues provided for safety with hand placement during sit <> stand. No further acute OT needs indicated at this time, per chart patient D/C home with family support today.  ? ? ? 11/30/21 1400  ?OT Visit Information  ?Last OT Received On 11/30/21  ?Assistance Needed +1  ?History of Present Illness Patient  is 71 YO female with PMH of HTN, HLD ,CHF,.presents to the ED  11/28/21 with complaint of abdominal pain, nausea, vomiting, poor p.o. intake after starting antibiotics for UTI.  ?Precautions  ?Precautions Fall  ?Restrictions  ?Weight Bearing Restrictions No  ?Home Living  ?Family/patient expects to be discharged to: Private residence  ?Living Arrangements Other relatives ?(grandson)  ?Available Help at Discharge Family;Available PRN/intermittently  ?Type of Home Apartment  ?Home Access Stairs to enter  ?Entrance Stairs-Number of Steps 1  ?Entrance Stairs-Rails None  ?Home Layout One level  ?Bathroom Shower/Tub Walk-in shower  ?Bathroom Toilet Handicapped height  ?Bathroom Accessibility Yes  ?[place] - single point;Rollator (4 wheels)  ?Prior Function  ?Prior Level of Function  Independent/Modified Independent  ?Mobility Comments does not drive, grandson assists with groceries and transportation, uses a cane mostly  ?Communication  ?Communication No difficulties  ?Pain Assessment  ?Pain Assessment No/denies pain  ?Cognition  ?Arousal/Alertness Awake/alert  ?Behavior During Therapy Flat affect  ?Overall Cognitive Status Within Functional Limits for tasks assessed  ?Upper Extremity Assessment  ?Upper Extremity Assessment Overall WFL for tasks assessed  ?Lower Extremity Assessment  ?Lower  Extremity Assessment Defer to PT evaluation  ?Cervical / Trunk Assessment  ?Cervical / Trunk Assessment Normal  ?ADL  ?Overall ADL's  At baseline  ?General ADL Comments Patient is able to perform toilet transfer, stand at sink for grooming/hygiene and ambulate to/from bathroom without physical assistance. Needs increased time and min cues for safety with hand placement  ?Bed Mobility  ?Overal bed mobility Modified Independent  ?Transfers  ?Overall transfer level Modified independent  ?Balance  ?Overall balance assessment Needs assistance  ?Sitting-balance support Feet supported;No upper extremity supported  ?Sitting balance-Leahy Scale Normal  ?Standing balance support During functional activity;No upper extremity supported  ?Standing balance-Leahy Scale Fair  ?Standing balance comment Able to stand at sink without upper extremity support  ?OT - End of Session  ?Equipment Utilized During [employer] walker (2 wheels)  ?Activity Tolerance Patient tolerated treatment well  ?Patient left in chair;with call bell/phone within reach;with chair alarm set  ?Nurse Communication Mobility status  ?OT Assessment  ?OT Recommendation/Assessment Patient does not need any further OT services  ?OT Visit Diagnosis Other abnormalities of gait and mobility (R26.89)  ?OT Problem List Decreased activity tolerance  ?AM-PAC OT "6 Clicks" Daily Activity Outcome Measure (Version 2)  ?Help from another person eating meals? 4  ?Help from another person taking care of personal grooming? 4  ?Help from another person toileting, which includes using toliet, bedpan, or urinal? 4  ?Help from another person bathing (including washing, rinsing, drying)? 4  ?Help from another person to put on and taking off regular upper body clothing? 4  ?Help from another person to put on and taking off regular lower body clothing? 4  ?6 Click Score 24  ?Progressive Mobility  ?What is the highest level of  mobility based on the progressive mobility assessment?  Level 5 (Walks with assist in room/hall) - Balance while stepping forward/back and can walk in room with assist - Complete  ?Activity Ambulated with assistance to bathroom  ?OT Recommendation  ?Follow Up Recommendations No OT follow up  ?Assistance recommended at discharge PRN  ?Functional Status Assessent Patient has not had a recent decline in their functional status  ?OT Equipment None recommended by OT  ?Acute Rehab OT Goals  ?Patient Stated Goal Home  ?OT Goal Formulation All assessment and education complete, DC therapy  ?OT Time Calculation  ?OT Start Time (ACUTE ONLY) 1121  ?OT Stop Time (ACUTE ONLY) 1145  ?OT Time Calculation (min) 24 min  ?OT General Charges  ?$OT Visit 1 Visit  ?OT Evaluation  ?$OT Eval Low Complexity 1 Low  ?OT Treatments  ?$Self Care/Home Management  8-22 mins  ?Written Expression  ?Dominant Hand Right  ? ?Delbert Phenix OT ?OT pager: [phone] ? ?

## 2021-12-02 DIAGNOSIS — E860 Dehydration: Secondary | ICD-10-CM | POA: Diagnosis not present

## 2021-12-02 DIAGNOSIS — K859 Acute pancreatitis without necrosis or infection, unspecified: Secondary | ICD-10-CM | POA: Diagnosis not present

## 2021-12-02 DIAGNOSIS — N184 Chronic kidney disease, stage 4 (severe): Secondary | ICD-10-CM | POA: Diagnosis not present

## 2021-12-02 DIAGNOSIS — N179 Acute kidney failure, unspecified: Secondary | ICD-10-CM | POA: Diagnosis not present

## 2021-12-02 DIAGNOSIS — I129 Hypertensive chronic kidney disease with stage 1 through stage 4 chronic kidney disease, or unspecified chronic kidney disease: Secondary | ICD-10-CM | POA: Diagnosis not present

## 2021-12-02 DIAGNOSIS — Z9181 History of falling: Secondary | ICD-10-CM | POA: Diagnosis not present

## 2021-12-03 DIAGNOSIS — Z0000 Encounter for general adult medical examination without abnormal findings: Secondary | ICD-10-CM | POA: Diagnosis not present

## 2021-12-03 DIAGNOSIS — E785 Hyperlipidemia, unspecified: Secondary | ICD-10-CM | POA: Diagnosis not present

## 2021-12-03 DIAGNOSIS — I129 Hypertensive chronic kidney disease with stage 1 through stage 4 chronic kidney disease, or unspecified chronic kidney disease: Secondary | ICD-10-CM | POA: Diagnosis not present

## 2021-12-08 DIAGNOSIS — K859 Acute pancreatitis without necrosis or infection, unspecified: Secondary | ICD-10-CM | POA: Diagnosis not present

## 2021-12-08 DIAGNOSIS — E860 Dehydration: Secondary | ICD-10-CM | POA: Diagnosis not present

## 2021-12-08 DIAGNOSIS — I129 Hypertensive chronic kidney disease with stage 1 through stage 4 chronic kidney disease, or unspecified chronic kidney disease: Secondary | ICD-10-CM | POA: Diagnosis not present

## 2021-12-08 DIAGNOSIS — Z9181 History of falling: Secondary | ICD-10-CM | POA: Diagnosis not present

## 2021-12-08 DIAGNOSIS — N184 Chronic kidney disease, stage 4 (severe): Secondary | ICD-10-CM | POA: Diagnosis not present

## 2021-12-08 DIAGNOSIS — N179 Acute kidney failure, unspecified: Secondary | ICD-10-CM | POA: Diagnosis not present

## 2021-12-10 DIAGNOSIS — E860 Dehydration: Secondary | ICD-10-CM | POA: Diagnosis not present

## 2021-12-10 DIAGNOSIS — N179 Acute kidney failure, unspecified: Secondary | ICD-10-CM | POA: Diagnosis not present

## 2021-12-10 DIAGNOSIS — N184 Chronic kidney disease, stage 4 (severe): Secondary | ICD-10-CM | POA: Diagnosis not present

## 2021-12-10 DIAGNOSIS — I129 Hypertensive chronic kidney disease with stage 1 through stage 4 chronic kidney disease, or unspecified chronic kidney disease: Secondary | ICD-10-CM | POA: Diagnosis not present

## 2021-12-10 DIAGNOSIS — K859 Acute pancreatitis without necrosis or infection, unspecified: Secondary | ICD-10-CM | POA: Diagnosis not present

## 2021-12-10 DIAGNOSIS — Z9181 History of falling: Secondary | ICD-10-CM | POA: Diagnosis not present

## 2021-12-13 DIAGNOSIS — I129 Hypertensive chronic kidney disease with stage 1 through stage 4 chronic kidney disease, or unspecified chronic kidney disease: Secondary | ICD-10-CM | POA: Diagnosis not present

## 2021-12-13 DIAGNOSIS — E860 Dehydration: Secondary | ICD-10-CM | POA: Diagnosis not present

## 2021-12-13 DIAGNOSIS — K859 Acute pancreatitis without necrosis or infection, unspecified: Secondary | ICD-10-CM | POA: Diagnosis not present

## 2021-12-13 DIAGNOSIS — N179 Acute kidney failure, unspecified: Secondary | ICD-10-CM | POA: Diagnosis not present

## 2021-12-13 DIAGNOSIS — Z9181 History of falling: Secondary | ICD-10-CM | POA: Diagnosis not present

## 2021-12-13 DIAGNOSIS — N184 Chronic kidney disease, stage 4 (severe): Secondary | ICD-10-CM | POA: Diagnosis not present

## 2021-12-14 DIAGNOSIS — I129 Hypertensive chronic kidney disease with stage 1 through stage 4 chronic kidney disease, or unspecified chronic kidney disease: Secondary | ICD-10-CM | POA: Diagnosis not present

## 2021-12-14 DIAGNOSIS — Z9181 History of falling: Secondary | ICD-10-CM | POA: Diagnosis not present

## 2021-12-14 DIAGNOSIS — K859 Acute pancreatitis without necrosis or infection, unspecified: Secondary | ICD-10-CM | POA: Diagnosis not present

## 2021-12-14 DIAGNOSIS — E860 Dehydration: Secondary | ICD-10-CM | POA: Diagnosis not present

## 2021-12-14 DIAGNOSIS — N179 Acute kidney failure, unspecified: Secondary | ICD-10-CM | POA: Diagnosis not present

## 2021-12-14 DIAGNOSIS — N184 Chronic kidney disease, stage 4 (severe): Secondary | ICD-10-CM | POA: Diagnosis not present

## 2021-12-17 DIAGNOSIS — D649 Anemia, unspecified: Secondary | ICD-10-CM | POA: Diagnosis not present

## 2021-12-17 DIAGNOSIS — I129 Hypertensive chronic kidney disease with stage 1 through stage 4 chronic kidney disease, or unspecified chronic kidney disease: Secondary | ICD-10-CM | POA: Diagnosis not present

## 2021-12-17 DIAGNOSIS — N1832 Chronic kidney disease, stage 3b: Secondary | ICD-10-CM | POA: Diagnosis not present

## 2021-12-17 DIAGNOSIS — E785 Hyperlipidemia, unspecified: Secondary | ICD-10-CM | POA: Diagnosis not present

## 2021-12-25 ENCOUNTER — Emergency Department (HOSPITAL_COMMUNITY): Payer: Medicare Other

## 2021-12-25 ENCOUNTER — Inpatient Hospital Stay (HOSPITAL_COMMUNITY)
Admission: EM | Admit: 2021-12-25 | Discharge: 2021-12-29 | DRG: 304 | Disposition: A | Discharge status: Home / Self Care | Payer: Medicare Other | Attending: Internal Medicine | Admitting: Internal Medicine

## 2021-12-25 ENCOUNTER — Other Ambulatory Visit: Payer: Self-pay

## 2021-12-25 ENCOUNTER — Encounter (HOSPITAL_COMMUNITY): Payer: Self-pay

## 2021-12-25 DIAGNOSIS — E875 Hyperkalemia: Secondary | ICD-10-CM | POA: Diagnosis not present

## 2021-12-25 DIAGNOSIS — R8271 Bacteriuria: Secondary | ICD-10-CM | POA: Diagnosis not present

## 2021-12-25 DIAGNOSIS — R112 Nausea with vomiting, unspecified: Secondary | ICD-10-CM | POA: Diagnosis not present

## 2021-12-25 DIAGNOSIS — R339 Retention of urine, unspecified: Secondary | ICD-10-CM | POA: Diagnosis present

## 2021-12-25 DIAGNOSIS — E8721 Acute metabolic acidosis: Secondary | ICD-10-CM | POA: Diagnosis present

## 2021-12-25 DIAGNOSIS — I161 Hypertensive emergency: Secondary | ICD-10-CM | POA: Diagnosis present

## 2021-12-25 DIAGNOSIS — K5730 Diverticulosis of large intestine without perforation or abscess without bleeding: Secondary | ICD-10-CM | POA: Diagnosis present

## 2021-12-25 DIAGNOSIS — J690 Pneumonitis due to inhalation of food and vomit: Secondary | ICD-10-CM | POA: Diagnosis not present

## 2021-12-25 DIAGNOSIS — R109 Unspecified abdominal pain: Secondary | ICD-10-CM | POA: Diagnosis not present

## 2021-12-25 DIAGNOSIS — R627 Adult failure to thrive: Secondary | ICD-10-CM | POA: Diagnosis present

## 2021-12-25 DIAGNOSIS — I160 Hypertensive urgency: Secondary | ICD-10-CM | POA: Diagnosis not present

## 2021-12-25 DIAGNOSIS — E785 Hyperlipidemia, unspecified: Secondary | ICD-10-CM | POA: Diagnosis not present

## 2021-12-25 DIAGNOSIS — I10 Essential (primary) hypertension: Secondary | ICD-10-CM | POA: Diagnosis not present

## 2021-12-25 DIAGNOSIS — K449 Diaphragmatic hernia without obstruction or gangrene: Secondary | ICD-10-CM | POA: Diagnosis present

## 2021-12-25 DIAGNOSIS — Z79899 Other long term (current) drug therapy: Secondary | ICD-10-CM | POA: Diagnosis not present

## 2021-12-25 DIAGNOSIS — R001 Bradycardia, unspecified: Secondary | ICD-10-CM | POA: Diagnosis present

## 2021-12-25 DIAGNOSIS — R9389 Abnormal findings on diagnostic imaging of other specified body structures: Secondary | ICD-10-CM

## 2021-12-25 DIAGNOSIS — Z6825 Body mass index (BMI) 25.0-25.9, adult: Secondary | ICD-10-CM

## 2021-12-25 DIAGNOSIS — Z7982 Long term (current) use of aspirin: Secondary | ICD-10-CM | POA: Diagnosis not present

## 2021-12-25 DIAGNOSIS — I700 Atherosclerosis of aorta: Secondary | ICD-10-CM | POA: Diagnosis not present

## 2021-12-25 DIAGNOSIS — N1832 Chronic kidney disease, stage 3b: Secondary | ICD-10-CM | POA: Diagnosis not present

## 2021-12-25 DIAGNOSIS — N179 Acute kidney failure, unspecified: Secondary | ICD-10-CM | POA: Diagnosis not present

## 2021-12-25 DIAGNOSIS — E7800 Pure hypercholesterolemia, unspecified: Secondary | ICD-10-CM | POA: Diagnosis present

## 2021-12-25 DIAGNOSIS — I129 Hypertensive chronic kidney disease with stage 1 through stage 4 chronic kidney disease, or unspecified chronic kidney disease: Secondary | ICD-10-CM | POA: Diagnosis present

## 2021-12-25 LAB — CBC WITH DIFFERENTIAL/PLATELET
Abs Immature Granulocytes: 0.04 10*3/uL (ref 0.00–0.07)
Basophils Absolute: 0 10*3/uL (ref 0.0–0.1)
Basophils Relative: 0 %
Eosinophils Absolute: 0 10*3/uL (ref 0.0–0.5)
Eosinophils Relative: 0 %
HCT: 43.3 % (ref 36.0–46.0)
Hemoglobin: 13.6 g/dL (ref 12.0–15.0)
Immature Granulocytes: 1 %
Lymphocytes Relative: 19 %
Lymphs Abs: 1.5 10*3/uL (ref 0.7–4.0)
MCH: 27.3 pg (ref 26.0–34.0)
MCHC: 31.4 g/dL (ref 30.0–36.0)
MCV: 86.8 fL (ref 80.0–100.0)
Monocytes Absolute: 0.4 10*3/uL (ref 0.1–1.0)
Monocytes Relative: 5 %
Neutro Abs: 5.7 10*3/uL (ref 1.7–7.7)
Neutrophils Relative %: 75 %
Platelets: 279 10*3/uL (ref 150–400)
RBC: 4.99 MIL/uL (ref 3.87–5.11)
RDW: 15.2 % (ref 11.5–15.5)
WBC: 7.6 10*3/uL (ref 4.0–10.5)
nRBC: 0 % (ref 0.0–0.2)

## 2021-12-25 LAB — COMPREHENSIVE METABOLIC PANEL
ALT: 13 U/L (ref 0–44)
AST: 19 U/L (ref 15–41)
Albumin: 3.9 g/dL (ref 3.5–5.0)
Alkaline Phosphatase: 94 U/L (ref 38–126)
Anion gap: 12 (ref 5–15)
BUN: 22 mg/dL (ref 8–23)
CO2: 19 mmol/L — ABNORMAL LOW (ref 22–32)
Calcium: 9.2 mg/dL (ref 8.9–10.3)
Chloride: 107 mmol/L (ref 98–111)
Creatinine, Ser: 1.58 mg/dL — ABNORMAL HIGH (ref 0.44–1.00)
GFR, Estimated: 34 mL/min — ABNORMAL LOW (ref >60)
Glucose, Bld: 122 mg/dL — ABNORMAL HIGH (ref 70–99)
Potassium: 4.2 mmol/L (ref 3.5–5.1)
Sodium: 138 mmol/L (ref 135–145)
Total Bilirubin: 0.7 mg/dL (ref 0.3–1.2)
Total Protein: 8.4 g/dL — ABNORMAL HIGH (ref 6.5–8.1)

## 2021-12-25 LAB — URINALYSIS, ROUTINE W REFLEX MICROSCOPIC
Bilirubin Urine: NEGATIVE
Glucose, UA: NEGATIVE mg/dL
Hgb urine dipstick: NEGATIVE
Ketones, ur: 5 mg/dL — AB
Leukocytes,Ua: NEGATIVE
Nitrite: NEGATIVE
Protein, ur: >=300 mg/dL — AB
Specific Gravity, Urine: 1.014 (ref 1.005–1.030)
pH: 6 (ref 5.0–8.0)

## 2021-12-25 LAB — MRSA NEXT GEN BY PCR, NASAL: MRSA by PCR Next Gen: NOT DETECTED

## 2021-12-25 LAB — LIPASE, BLOOD: Lipase: 81 U/L — ABNORMAL HIGH (ref 11–51)

## 2021-12-25 MED ORDER — ORAL CARE MOUTH RINSE
15.0000 mL | Freq: Two times a day (BID) | OROMUCOSAL | Status: DC
  Administered 2021-12-25 – 2021-12-29 (×9): 15 mL via OROMUCOSAL

## 2021-12-25 MED ORDER — CARVEDILOL 25 MG PO TABS
25.0000 mg | ORAL_TABLET | Freq: Two times a day (BID) | ORAL | Status: DC
  Administered 2021-12-25 – 2021-12-29 (×7): 25 mg via ORAL
  Filled 2021-12-25 (×5): qty 1
  Filled 2021-12-25: qty 2
  Filled 2021-12-25: qty 1
  Filled 2021-12-25: qty 2

## 2021-12-25 MED ORDER — TRAMADOL HCL 50 MG PO TABS
50.0000 mg | ORAL_TABLET | Freq: Once | ORAL | Status: AC
  Administered 2021-12-26: 50 mg via ORAL
  Filled 2021-12-25: qty 1

## 2021-12-25 MED ORDER — FENTANYL CITRATE PF 50 MCG/ML IJ SOSY
25.0000 ug | PREFILLED_SYRINGE | Freq: Once | INTRAMUSCULAR | Status: AC
  Administered 2021-12-25: 25 ug via INTRAVENOUS
  Filled 2021-12-25: qty 1

## 2021-12-25 MED ORDER — ONDANSETRON HCL 4 MG/2ML IJ SOLN: 4.0000 mg | Freq: Four times a day (QID) | INTRAMUSCULAR | Status: DC | PRN

## 2021-12-25 MED ORDER — NICARDIPINE HCL IN NACL 20-0.86 MG/200ML-% IV SOLN
3.0000 mg/h | INTRAVENOUS | Status: DC
  Administered 2021-12-25 (×2): 12.5 mg/h via INTRAVENOUS
  Administered 2021-12-25: 5 mg/h via INTRAVENOUS
  Administered 2021-12-25: 10 mg/h via INTRAVENOUS
  Administered 2021-12-25: 15 mg/h via INTRAVENOUS
  Filled 2021-12-25 (×2): qty 200
  Filled 2021-12-25: qty 400
  Filled 2021-12-25 (×5): qty 200

## 2021-12-25 MED ORDER — ACETAMINOPHEN 325 MG PO TABS
650.0000 mg | ORAL_TABLET | Freq: Four times a day (QID) | ORAL | Status: DC | PRN
  Administered 2021-12-25 – 2021-12-27 (×4): 650 mg via ORAL
  Filled 2021-12-25 (×4): qty 2

## 2021-12-25 MED ORDER — DIPHENHYDRAMINE HCL 50 MG/ML IJ SOLN
25.0000 mg | Freq: Once | INTRAMUSCULAR | Status: AC
  Administered 2021-12-25: 25 mg via INTRAVENOUS
  Filled 2021-12-25: qty 1

## 2021-12-25 MED ORDER — ATORVASTATIN CALCIUM 10 MG PO TABS
10.0000 mg | ORAL_TABLET | Freq: Every day | ORAL | Status: DC
  Administered 2021-12-25 – 2021-12-29 (×5): 10 mg via ORAL
  Filled 2021-12-25 (×5): qty 1

## 2021-12-25 MED ORDER — ONDANSETRON HCL 4 MG/2ML IJ SOLN
4.0000 mg | Freq: Once | INTRAMUSCULAR | Status: AC
  Administered 2021-12-25: 4 mg via INTRAVENOUS
  Filled 2021-12-25: qty 2

## 2021-12-25 MED ORDER — HYDRALAZINE HCL 20 MG/ML IJ SOLN
20.0000 mg | Freq: Once | INTRAMUSCULAR | Status: AC
  Administered 2021-12-25: 20 mg via INTRAVENOUS
  Filled 2021-12-25: qty 1

## 2021-12-25 MED ORDER — SODIUM CHLORIDE 0.9 % IV SOLN: INTRAVENOUS | Status: DC

## 2021-12-25 MED ORDER — CHLORHEXIDINE GLUCONATE CLOTH 2 % EX PADS
6.0000 | MEDICATED_PAD | Freq: Every day | CUTANEOUS | Status: DC
  Administered 2021-12-25 – 2021-12-26 (×2): 6 via TOPICAL

## 2021-12-25 MED ORDER — SODIUM CHLORIDE 0.9 % IV BOLUS
500.0000 mL | Freq: Once | INTRAVENOUS | Status: AC
  Administered 2021-12-25: 500 mL via INTRAVENOUS

## 2021-12-25 MED ORDER — ASPIRIN 81 MG PO TBEC
81.0000 mg | DELAYED_RELEASE_TABLET | Freq: Every day | ORAL | Status: DC
  Administered 2021-12-25 – 2021-12-29 (×5): 81 mg via ORAL
  Filled 2021-12-25 (×5): qty 1

## 2021-12-25 MED ORDER — ACETAMINOPHEN 650 MG RE SUPP: 650.0000 mg | Freq: Four times a day (QID) | RECTAL | Status: DC | PRN

## 2021-12-25 MED ORDER — PROCHLORPERAZINE EDISYLATE 10 MG/2ML IJ SOLN
10.0000 mg | Freq: Once | INTRAMUSCULAR | Status: AC
  Administered 2021-12-25: 10 mg via INTRAVENOUS
  Filled 2021-12-25: qty 2

## 2021-12-25 MED ORDER — HYDROMORPHONE HCL 1 MG/ML IJ SOLN
0.5000 mg | INTRAMUSCULAR | Status: DC | PRN
  Administered 2021-12-28: 0.5 mg via INTRAVENOUS
  Filled 2021-12-25 (×2): qty 0.5

## 2021-12-25 MED ORDER — CLONIDINE HCL 0.1 MG PO TABS
0.2000 mg | ORAL_TABLET | Freq: Three times a day (TID) | ORAL | Status: DC
  Administered 2021-12-25 – 2021-12-26 (×3): 0.2 mg via ORAL
  Filled 2021-12-25 (×3): qty 2

## 2021-12-25 MED ORDER — ASPIRIN 81 MG PO TABS: 81.0000 mg | ORAL_TABLET | Freq: Every day | ORAL | Status: DC

## 2021-12-25 MED ORDER — PROCHLORPERAZINE EDISYLATE 10 MG/2ML IJ SOLN: 10.0000 mg | Freq: Four times a day (QID) | INTRAMUSCULAR | Status: DC | PRN

## 2021-12-25 MED ORDER — SODIUM CHLORIDE 0.9 % IV SOLN: Freq: Once | INTRAVENOUS | Status: AC

## 2021-12-25 MED ORDER — METOPROLOL TARTRATE 5 MG/5ML IV SOLN
5.0000 mg | Freq: Four times a day (QID) | INTRAVENOUS | Status: DC | PRN
Start: 2021-12-25 — End: 2021-12-25

## 2021-12-25 NOTE — ED Provider Notes (Signed)
[facility] Emergency Department Provider Note MRN:  [id number]  Arrival date & time: 12/25/21     Chief Complaint   Abdominal Pain and Emesis   History of Present Illness   LEANDRIA THIER is a 71 y.o. year-old female with a history of hypertension presenting to the ED with chief complaint of abdominal pain.  Nausea and vomiting that started 2 or 3 days ago, followed by lower abdominal pain.  Was recently in the hospital last month with upper abdominal pain.  Also having some diarrhea for the past day or 2.  No fever, no chest pain or shortness of breath.  Review of Systems  A thorough review of systems was obtained and all systems are negative except as noted in the HPI and PMH.   Patient's Health History    Past Medical History:  Diagnosis Date   High cholesterol    Hypertension     Past Surgical History:  Procedure Laterality Date   PARTIAL HYSTERECTOMY      History reviewed. No pertinent family history.  Social History   Socioeconomic History   Marital status: Legally Separated    Spouse name: Not on file   Number of children: Not on file   Years of education: Not on file   Highest education level: Not on file  Occupational History   Not on file  Tobacco Use   Smoking status: Never   Smokeless tobacco: Never  Substance and Sexual Activity   Alcohol use: No   Drug use: No   Sexual activity: Not on file  Other Topics Concern   Not on file  Social History Narrative   Not on file   Social Determinants of Health   Financial Resource Strain: Not on file  Food Insecurity: Not on file  Transportation Needs: Not on file  Physical Activity: Not on file  Stress: Not on file  Social Connections: Not on file  Intimate Partner Violence: Not on file     Physical Exam   Vitals:   12/25/21 0545 12/25/21 0549  BP: (!) 152/94   Pulse: (!) 117 98  Resp: 18   Temp:    SpO2: 100%     CONSTITUTIONAL: Well-appearing, seems  uncomfortable NEURO/PSYCH:  Alert and oriented x 3, no focal deficits EYES:  eyes equal and reactive ENT/NECK:  no LAD, no JVD CARDIO: Regular rate, well-perfused, normal S1 and S2 PULM:  CTAB no wheezing or rhonchi GI/GU:  non-distended, non-tender MSK/SPINE:  No gross deformities, no edema SKIN:  no rash, atraumatic   *Additional and/or pertinent findings included in MDM below  Diagnostic and Interventional Summary    EKG Interpretation  Date/Time:    Ventricular Rate:    PR Interval:    QRS Duration:   QT Interval:    QTC Calculation:   R Axis:     Text Interpretation:         Labs Reviewed  URINALYSIS, ROUTINE W REFLEX MICROSCOPIC - Abnormal; Notable for the following components:      Result Value   APPearance HAZY (*)    Ketones, ur 5 (*)    Protein, ur >=300 (*)    Bacteria, UA MANY (*)    All other components within normal limits  COMPREHENSIVE METABOLIC PANEL - Abnormal; Notable for the following components:   CO2 19 (*)    Glucose, Bld 122 (*)    Creatinine, Ser 1.58 (*)    Total Protein 8.4 (*)    GFR, Estimated  34 (*)    All other components within normal limits  LIPASE, BLOOD - Abnormal; Notable for the following components:   Lipase 81 (*)    All other components within normal limits  CBC WITH DIFFERENTIAL/PLATELET    CT ABDOMEN PELVIS WO CONTRAST  Final Result      Medications  prochlorperazine (COMPAZINE) injection 10 mg (has no administration in time range)  diphenhydrAMINE (BENADRYL) injection 25 mg (has no administration in time range)  ondansetron (ZOFRAN) injection 4 mg (4 mg Intravenous Given 12/25/21 0258)  fentaNYL (SUBLIMAZE) injection 25 mcg (25 mcg Intravenous Given 12/25/21 0258)  sodium chloride 0.9 % bolus 500 mL (0 mLs Intravenous Stopped 12/25/21 0530)  hydrALAZINE (APRESOLINE) injection 20 mg (20 mg Intravenous Given 12/25/21 0521)  ondansetron (ZOFRAN) injection 4 mg (4 mg Intravenous Given 12/25/21 2703)     Procedures  /   Critical Care Procedures  ED Course and Medical Decision Making  Initial Impression and Ddx Lower abdominal pain, nausea vomiting diarrhea.  Recent admission for dehydration, AKI, somewhat similar presentation but at that time it was more epigastric pain where today it is suprapubic/left lower quadrant.  Differential diagnosis includes diverticulitis, appendicitis, UTI, enteritis.  Awaiting labs, CT.  Past medical/surgical history that increases complexity of ED encounter: None  Interpretation of Diagnostics I personally reviewed the laboratory assessment and my interpretation is as follows: No significant blood count or electrolyte disturbance, mild acidosis      Patient Reassessment and Ultimate Disposition/Management Patient with persistent nausea vomiting, p.o. intolerance.  Blood pressure is improved after hydralazine.  Will admit to medicine.  Patient management required discussion with the following services or consulting groups:  Hospitalist Service  Complexity of Problems Addressed Acute illness or injury that poses threat of life of bodily function  Additional Data Reviewed and Analyzed Further history obtained from: None  Additional Factors Impacting ED Encounter Risk Consideration of hospitalization  Barth Kirks. Sedonia Small, [place] [email or web address]  Final Clinical Impressions(s) / ED Diagnoses     ICD-10-CM   1. Nausea and vomiting, unspecified vomiting type  R11.2       ED Discharge Orders     None        Discharge Instructions Discussed with and Provided to Patient:   Discharge Instructions   None      Maudie Flakes, MD 12/25/21 [phone]

## 2021-12-25 NOTE — H&P (Signed)
History and Physical    Patient: Tamara Friedman DOB: February 07, 1951 DOA: 12/25/2021 DOS: the patient was seen and examined on 12/25/2021 PCP: Lucianne Lei, MD  Patient coming from: Home  Chief Complaint:  Chief Complaint  Patient presents with   Abdominal Pain   Emesis   HPI: Tamara Friedman is a 71 y.o. female with medical history significant of HTN, HLD, CKD3b. Presenting with N/V and abdominal pain. Her symptoms started about 3 days ago. As her N/V persisted, she developed periumbilical abdominal pina. She reports some intermittent diarrhea, but no fever, respiratory symptoms, or sick contacts. She has not been able to keep any PO down. When her symptoms didn't improve last night, she decided to come to the ED for help. She denies any other aggravating or alleviating factors.    Review of Systems: As mentioned in the history of present illness. All other systems reviewed and are negative. Past Medical History:  Diagnosis Date   High cholesterol    Hypertension    Past Surgical History:  Procedure Laterality Date   PARTIAL HYSTERECTOMY     Social History:  reports that she has never smoked. She has never used smokeless tobacco. She reports that she does not drink alcohol and does not use drugs.  No Known Allergies  FamHx History reviewed. No pertinent family history.  Prior to Admission medications   Medication Sig Start Date End Date Taking? Authorizing Provider  amLODipine (NORVASC) 5 MG tablet Take 5 mg by mouth daily. 10/29/21  Yes [provider]  aspirin 81 MG tablet Take 81 mg by mouth daily.    Yes [provider]  atorvastatin (LIPITOR) 10 MG tablet Take 10 mg by mouth daily.   Yes [provider]  carvedilol (COREG) 25 MG tablet Take 25 mg by mouth 2 (two) times daily with a meal.   Yes [provider]  cloNIDine (CATAPRES) 0.2 MG tablet Take 0.2 mg by mouth 3 (three) times daily. 11/18/21  Yes [provider]   Multiple Vitamins-Minerals (MULTIVITAMIN ADULTS) TABS Take 1 tablet by mouth daily.   Yes [provider]  ondansetron (ZOFRAN-ODT) 8 MG disintegrating tablet Take 1 tablet (8 mg total) by mouth every 8 (eight) hours as needed for nausea or vomiting. 11/30/21  Yes Kathie Dike, MD  pantoprazole (PROTONIX) 40 MG tablet Take 1 tablet (40 mg total) by mouth daily. Patient not taking: Reported on 12/25/2021 11/30/21 11/30/22  Kathie Dike, MD    Physical Exam: Vitals:   12/25/21 0515 12/25/21 0545 12/25/21 0549 12/25/21 0700  BP: (!) 202/144 (!) 152/94  (!) 193/114  Pulse: 88 (!) 117 98 98  Resp: 18 18    Temp:      TempSrc:      SpO2: 97% 100%  99%  Weight:      Height:       General: 71 y.o. female resting in bed in NAD Eyes: PERRL, normal sclera ENMT: Nares patent w/o discharge, orophaynx clear, dentition normal, ears w/o discharge/lesions/ulcers Neck: Supple, trachea midline Cardiovascular: tachy, +S1, S2, no m/g/r, equal pulses throughout Respiratory: CTABL, no w/r/r, normal WOB GI: BS+, NDNT, no masses noted, no organomegaly noted MSK: No e/c/c Neuro: A&O x 3, no focal deficits Psyc: Flat affect but calm/cooperative  Data Reviewed:  Na+  138 CO2  19 Glucose  122 SCr  1.58 Lipase  81 WBC  7.6 Hgb  13.6 Plt  279  CT ab/pelvis: Large hiatal hernia. Airspace opacity in the right  lung base concerning for pneumonia. Aortic atherosclerosis. Sigmoid diverticulosis. No acute findings in the abdomen or pelvis.  Assessment and Plan: Intractable N/V Abdominal pain     - admit to inpt, SDU     - compazine, zofran, fluids, PPI     - PRN pain control     - CT ab/pelvis as above     - NPO for now  Hypertensive Urgency     - unable to hold down her meds and not having good control on PRNs     - will start cardene gtt     - resume home regimen when tolerating PO  CKD3b     - at baseline, follow, watch nephrotoxins  HLD     - resume home regimen when  tolerating PO  Abnormal CT finding     - CT reports possible RLL PNA     - she denies dyspnea, cough; no white count or fever     - hold on abx for right now  Advance Care Planning:   Code Status: FULL  Consults: None  Family Communication: None at bedside  Severity of Illness: The appropriate patient status for this patient is OBSERVATION. Observation status is judged to be reasonable and necessary in order to provide the required intensity of service to ensure the patient's safety. The patient's presenting symptoms, physical exam findings, and initial radiographic and laboratory data in the context of their medical condition is felt to place them at decreased risk for further clinical deterioration. Furthermore, it is anticipated that the patient will be medically stable for discharge from the hospital within 2 midnights of admission.   Author: Jonnie Finner, DO 12/25/2021 7:09 AM  For on call review [email or web address].

## 2021-12-25 NOTE — ED Notes (Signed)
Attempted [place] IV in R forearm without success.

## 2021-12-25 NOTE — ED Triage Notes (Signed)
Pt reports with abdominal pain and vomiting since Wednesday. Pt states that she has been unable to keep anything down including her medications.

## 2021-12-26 DIAGNOSIS — I160 Hypertensive urgency: Secondary | ICD-10-CM | POA: Diagnosis not present

## 2021-12-26 LAB — COMPREHENSIVE METABOLIC PANEL
ALT: 11 U/L (ref 0–44)
AST: 15 U/L (ref 15–41)
Albumin: 3 g/dL — ABNORMAL LOW (ref 3.5–5.0)
Alkaline Phosphatase: 66 U/L (ref 38–126)
Anion gap: 4 — ABNORMAL LOW (ref 5–15)
BUN: 32 mg/dL — ABNORMAL HIGH (ref 8–23)
CO2: 20 mmol/L — ABNORMAL LOW (ref 22–32)
Calcium: 8.2 mg/dL — ABNORMAL LOW (ref 8.9–10.3)
Chloride: 118 mmol/L — ABNORMAL HIGH (ref 98–111)
Creatinine, Ser: 2.11 mg/dL — ABNORMAL HIGH (ref 0.44–1.00)
GFR, Estimated: 24 mL/min — ABNORMAL LOW (ref >60)
Glucose, Bld: 89 mg/dL (ref 70–99)
Potassium: 3.6 mmol/L (ref 3.5–5.1)
Sodium: 142 mmol/L (ref 135–145)
Total Bilirubin: 0.7 mg/dL (ref 0.3–1.2)
Total Protein: 6.3 g/dL — ABNORMAL LOW (ref 6.5–8.1)

## 2021-12-26 LAB — C DIFFICILE QUICK SCREEN W PCR REFLEX
C Diff antigen: NEGATIVE
C Diff interpretation: NOT DETECTED
C Diff toxin: NEGATIVE

## 2021-12-26 LAB — CBC
HCT: 35.3 % — ABNORMAL LOW (ref 36.0–46.0)
Hemoglobin: 10.5 g/dL — ABNORMAL LOW (ref 12.0–15.0)
MCH: 26.6 pg (ref 26.0–34.0)
MCHC: 29.7 g/dL — ABNORMAL LOW (ref 30.0–36.0)
MCV: 89.6 fL (ref 80.0–100.0)
Platelets: 213 10*3/uL (ref 150–400)
RBC: 3.94 MIL/uL (ref 3.87–5.11)
RDW: 15.6 % — ABNORMAL HIGH (ref 11.5–15.5)
WBC: 5.5 10*3/uL (ref 4.0–10.5)
nRBC: 0 % (ref 0.0–0.2)

## 2021-12-26 LAB — GLUCOSE, CAPILLARY: Glucose-Capillary: 85 mg/dL (ref 70–99)

## 2021-12-26 MED ORDER — SODIUM BICARBONATE 650 MG PO TABS
650.0000 mg | ORAL_TABLET | Freq: Two times a day (BID) | ORAL | Status: AC
  Administered 2021-12-26 – 2021-12-27 (×4): 650 mg via ORAL
  Filled 2021-12-26 (×4): qty 1

## 2021-12-26 MED ORDER — CLONIDINE HCL 0.2 MG PO TABS
0.2000 mg | ORAL_TABLET | Freq: Every day | ORAL | Status: DC
  Administered 2021-12-26 – 2021-12-28 (×3): 0.2 mg via ORAL
  Filled 2021-12-26 (×3): qty 1

## 2021-12-26 MED ORDER — TAMSULOSIN HCL 0.4 MG PO CAPS
0.4000 mg | ORAL_CAPSULE | Freq: Every day | ORAL | Status: AC
  Administered 2021-12-26 – 2021-12-28 (×3): 0.4 mg via ORAL
  Filled 2021-12-26 (×3): qty 1

## 2021-12-26 MED ORDER — POLYETHYLENE GLYCOL 3350 17 G PO PACK
17.0000 g | PACK | Freq: Every day | ORAL | Status: DC
  Administered 2021-12-26 – 2021-12-29 (×3): 17 g via ORAL
  Filled 2021-12-26 (×4): qty 1

## 2021-12-26 MED ORDER — SODIUM CHLORIDE 0.9 % IV SOLN: INTRAVENOUS | Status: AC

## 2021-12-26 MED ORDER — SENNOSIDES-DOCUSATE SODIUM 8.6-50 MG PO TABS
1.0000 | ORAL_TABLET | Freq: Two times a day (BID) | ORAL | Status: DC
  Administered 2021-12-26 – 2021-12-29 (×7): 1 via ORAL
  Filled 2021-12-26 (×7): qty 1

## 2021-12-26 NOTE — Progress Notes (Signed)
Patient has not voided since in and out cath at 1300 when in ICU, patient states she has no urge to void. Bladder scan performed, read as 41 mls.  Will continue to monitor.

## 2021-12-26 NOTE — Progress Notes (Addendum)
This RN gave scheduled carvedilol at 1741; patient's  BP @ 1730: 117/71 (86), HR 88. At 1745 patients BP: 187/122 and BP at 1800:150/71. At 1815, patient's BP:77/45 (108). This RN to bedside; Nicardipine gtt turned off. Patient lethargic but able to be aroused. This RN set BP interval to every 5 mins. BP at 1820: 98/44 (59) (see vitals flowsheet for BPs). RN continued to be at bedside, MD Cherylann Ratel paged to [employer]. BP at 1845: 100/53 (67). BP at 1900 96/64 (75). BP at 1915: 125/70 (87). Patient awake at this time and is A/Ox4.

## 2021-12-26 NOTE — Plan of Care (Signed)
  Problem: Education: Goal: Knowledge of General Education information will improve Description: Including pain rating scale, medication(s)/side effects and non-pharmacologic comfort measures 12/26/2021 2225 by Glenna Fellows D, RN Outcome: Progressing 12/26/2021 2225 by Tula Nakayama, RN Outcome: Progressing   Problem: Health Behavior/Discharge Planning: Goal: Ability to manage health-related needs will improve 12/26/2021 2225 by Tula Nakayama, RN Outcome: Progressing 12/26/2021 2225 by Glenna Fellows D, RN Outcome: Progressing   Problem: Clinical Measurements: Goal: Ability to maintain clinical measurements within normal limits will improve 12/26/2021 2225 by Tula Nakayama, RN Outcome: Progressing 12/26/2021 2225 by Glenna Fellows D, RN Outcome: Progressing Goal: Will remain free from infection 12/26/2021 2225 by Glenna Fellows D, RN Outcome: Progressing 12/26/2021 2225 by Glenna Fellows D, RN Outcome: Progressing Goal: Diagnostic test results will improve 12/26/2021 2225 by Tula Nakayama, RN Outcome: Progressing 12/26/2021 2225 by Glenna Fellows D, RN Outcome: Progressing Goal: Respiratory complications will improve Outcome: Progressing Goal: Cardiovascular complication will be avoided Outcome: Progressing   Problem: Activity: Goal: Risk for activity intolerance will decrease Outcome: Progressing   Problem: Nutrition: Goal: Adequate nutrition will be maintained Outcome: Progressing   Problem: Coping: Goal: Level of anxiety will decrease Outcome: Progressing   Problem: Elimination: Goal: Will not experience complications related to bowel motility Outcome: Progressing Goal: Will not experience complications related to urinary retention Outcome: Progressing   Problem: Pain Managment: Goal: General experience of comfort will improve Outcome: Progressing   Problem: Safety: Goal: Ability to remain free from injury will improve Outcome: Progressing    Problem: Skin Integrity: Goal: Risk for impaired skin integrity will decrease Outcome: Progressing

## 2021-12-26 NOTE — Progress Notes (Addendum)
PROGRESS NOTE    JESTINE BICKNELL  HWE:[id number] DOB: 27-Jun-1951 DOA: 12/25/2021 PCP: Lucianne Lei, MD     Brief Narrative:   H/o HTN, HLD, CKD3b present to the hospital c/o Ab pain, N/V, found to be in hypertension urgency, started on cardene drip and admitted to stepdown  Reports has not able to keep any blood pressure meds down since wendesday due to n/v, grandson reports patient appeared normal when he took her out for lunch on Wednesday , patient reports symptom started after her returned home from lunch   Subjective:   Weaned off cardene drip yesterday, Bp low normal with bradycardia States coreg and clonidine makes her sleepy,    N/v has resolved, she desires to eat  Reports having diarrhea  Denies epigastric pain or flank pain, Reports suprapubic pain  She  also reports "had  whooping cough ", I did not hear any cough, grandson states did not hear any cough either  She is aaox3, but not a reliable historian,  Per chart review she was seen in the ED for n/v/ab pain, elevated blood pressure on 11/01/2021, 11/28/2021  ( admitted from 4/30-5/2) and this time, she could not remember these  grandson at bedside states he noticed his grandmother started to have memory issues  Assessment & Plan:  Principal Problem:   Hypertensive urgency Active Problems:   HTN (hypertension)   Intractable nausea and vomiting   HLD (hyperlipidemia)   Stage 3b chronic kidney disease (CKD) (HCC)   Abdominal pain   Abnormal CT scan    Assessment and Plan:  HTN urgency -off cardene drip, transfer out of icu -appear to be on norvasc, coreg and clonidine at home -decrease clonidine to qhs, not sure clonidine is a good choice for this patient due to tendency of bradycardia, drowsiness, her memory issues and frequent GI issues -continue coreg for now   N/V/Ab pain: - CT ab /pel " No acute findings in the abdomen or pelvis." Did show "Large hiatal hernia, Sigmoid diverticulosis" I personally  reviewed CT it also showed formed stool throughout the colon -she presents with diarrhea, overflow diarrhea? C diff and gi pcr panel ordered and collected on 5/28 -reports n/v resolved, will start full liquid for now, advance diet as tolerated  -currently does has suprapubic tenderness, but does not appear to have tenderness in other abdomen area   Airspace opacity in the right lung base concerning for pneumonia, on CT ab /pel -currently does not have respiratory symptom, no leukocytosis, no fever -could be aspiration pneumonitis from N/V at home -monitor off abx for now   Acute urinary retention  -Bladder scan was done this morning due to she reports felt the urge to pee.  bladder scanned and it was 750cc,  She urinated 450 but still had 230 in her bladder by the scan, in and out with 400 cc out -start flomax -increase activity -avoid  constipation  -bladder scan qshit to measure PVR, may need foley insertion if does not improve   Bacteriuria No fever, no leukocytosis Due to reports of suprapubic pain will get urine culture    CKDIIIb/IV Per chart review, renal function has been fluctuating from IIIv and IV recently Found to have  urinary retention but CT ab/pel did not show obstructive nephropathy She denies flank pain Monitor renal funciton  Elevated lipase No epigastric pain Ct ab/pel did not show abnormality in pancreas Possible related to ckd    incidental findings on CT scan Aortic atherosclerosis  continue  statin , asa  Large hiatal hernia  Elevate bed, meal in chair F/u with GI Dr Benson [place]    FTT: she reports lives by herself in her apartment,  she does not drive, her grandson drives her if need be grandson reports patient is gradually become weaker , will get PT eval    I have Reviewed nursing notes, Vitals, pain scores, I/o's, Lab results and  imaging results since pt's last encounter, details please see discussion above  I ordered the following labs:   Unresulted Labs (From admission, onward)     Start     Ordered   12/27/21 0500  CBC with Differential/Platelet  Tomorrow morning,   R       Question:  Specimen collection method  Answer:  Lab=Lab collect   12/26/21 1251   12/27/21 2409  Basic metabolic panel  Tomorrow morning,   R       Question:  Specimen collection method  Answer:  Lab=Lab collect   12/26/21 1251   12/27/21 0500  Magnesium  Tomorrow morning,   R       Question:  Specimen collection method  Answer:  Lab=Lab collect   12/26/21 1251   12/27/21 0500  Lipase, blood  Tomorrow morning,   R       Question:  Specimen collection method  Answer:  Lab=Lab collect   12/26/21 1251   12/26/21 1257  Urine Culture  Once,   R       Question:  Indication  Answer:  Dysuria   12/26/21 1256   12/25/21 1014  C Difficile Quick Screen w PCR reflex  (C Difficile quick screen w PCR reflex panel )  Once, for 24 hours,   TIMED       References:    CDiff Information Tool   12/25/21 1013   12/25/21 1014  Gastrointestinal Panel by PCR , Stool  (Gastrointestinal Panel by PCR, Stool                                                                                                                                                     **Does Not include CLOSTRIDIUM DIFFICILE testing. **If CDIFF testing is needed, place order from the "C Difficile Testing" order set.**)  Once,   R        12/25/21 1013             DVT prophylaxis: SCDs Start: 12/25/21 1004   Code Status:   Code Status: Full Code  Family Communication: grandson at bedside with permission  Disposition:    Dispo: The patient is from: home, lives by herself              Anticipated d/c is to: TBD, will need therapy eval              Anticipated d/c date  is: >24hrs, need to follow up on urine culture, stool study, urinary retention,   Antimicrobials:   Anti-infectives (From admission, onward)    None           Objective: Vitals:   12/26/21 1145 12/26/21 1200 12/26/21  1215 12/26/21 1300  BP:  124/61  (!) 147/71  Pulse: (!) 47 (!) 46 (!) 47 (!) 58  Resp: '12 13 13 18  '$ Temp:      TempSrc:      SpO2: 100% 99% 99% 95%  Weight:      Height:        Intake/Output Summary (Last 24 hours) at 12/26/2021 1403 Last data filed at 12/26/2021 1300 Gross per 24 hour  Intake 2125.65 ml  Output 1550 ml  Net 575.65 ml   Filed Weights   12/25/21 0147 12/26/21 0500  Weight: 72.6 kg 71.6 kg    Examination:  General exam: appear weak, but aaox3, not a reliable historian , appear to have impaired memory  Respiratory system: diminished, no wheezing, no rales, no rhonchi. Respiratory effort normal. Cardiovascular system:  RRR.  Gastrointestinal system: suprapubic tenderness, Abdomen is nondistended, soft .  Normal bowel sounds heard. Central nervous system: Alert and orientedx3. No focal neurological deficits. Extremities:  no edema Skin: No rashes, lesions or ulcers Psychiatry: calm and cooperative,     Data Reviewed: I have personally reviewed  labs and visualized  imaging studies since the last encounter and formulate the plan        Scheduled Meds:  aspirin EC  81 mg Oral Daily   atorvastatin  10 mg Oral Daily   carvedilol  25 mg Oral BID WC   Chlorhexidine Gluconate Cloth  6 each Topical Daily   cloNIDine  0.2 mg Oral QHS   mouth rinse  15 mL Mouth Rinse BID   polyethylene glycol  17 g Oral Daily   senna-docusate  1 tablet Oral BID   sodium bicarbonate  650 mg Oral BID   tamsulosin  0.4 mg Oral QPC supper   Continuous Infusions:  sodium chloride 75 mL/hr at 12/26/21 1300   niCARDipine Stopped (12/25/21 1816)     LOS: 1 day    Florencia Reasons, MD PhD FACP [facility]  Available via Epic secure chat 7am-7pm for nonurgent issues Please page for urgent issues To page the attending provider between 7A-7P or the covering provider during after hours 7P-7A, please log into the web site [email or web address] and access using universal [place]  password for that web site. If you do not have the password, please call the hospital operator.    12/26/2021, 2:03 PM

## 2021-12-26 NOTE — Progress Notes (Signed)
This RN gave scheduled carvedilol at 0845. Patients BP: 133/80. HR: 60. At 0859 patient's HR: 53. Patient at time is sitting in bed, having a conversation with this RN. Patient A/Ox4. Patient states she is "a little sleepy" and tells this RN that she "gets sleepy after taking carvedilol." MD notified.

## 2021-12-27 DIAGNOSIS — I160 Hypertensive urgency: Secondary | ICD-10-CM | POA: Diagnosis not present

## 2021-12-27 LAB — CBC WITH DIFFERENTIAL/PLATELET
Abs Immature Granulocytes: 0.01 10*3/uL (ref 0.00–0.07)
Basophils Absolute: 0 10*3/uL (ref 0.0–0.1)
Basophils Relative: 0 %
Eosinophils Absolute: 0 10*3/uL (ref 0.0–0.5)
Eosinophils Relative: 1 %
HCT: 31.3 % — ABNORMAL LOW (ref 36.0–46.0)
Hemoglobin: 9.3 g/dL — ABNORMAL LOW (ref 12.0–15.0)
Immature Granulocytes: 0 %
Lymphocytes Relative: 55 %
Lymphs Abs: 2.7 10*3/uL (ref 0.7–4.0)
MCH: 26.4 pg (ref 26.0–34.0)
MCHC: 29.7 g/dL — ABNORMAL LOW (ref 30.0–36.0)
MCV: 88.9 fL (ref 80.0–100.0)
Monocytes Absolute: 0.5 10*3/uL (ref 0.1–1.0)
Monocytes Relative: 11 %
Neutro Abs: 1.6 10*3/uL — ABNORMAL LOW (ref 1.7–7.7)
Neutrophils Relative %: 33 %
Platelets: 198 10*3/uL (ref 150–400)
RBC: 3.52 MIL/uL — ABNORMAL LOW (ref 3.87–5.11)
RDW: 15.7 % — ABNORMAL HIGH (ref 11.5–15.5)
WBC: 5 10*3/uL (ref 4.0–10.5)
nRBC: 0 % (ref 0.0–0.2)

## 2021-12-27 LAB — URINE CULTURE: Culture: <10000 — AB

## 2021-12-27 LAB — BASIC METABOLIC PANEL
Anion gap: 5 (ref 5–15)
BUN: 30 mg/dL — ABNORMAL HIGH (ref 8–23)
CO2: 21 mmol/L — ABNORMAL LOW (ref 22–32)
Calcium: 8.3 mg/dL — ABNORMAL LOW (ref 8.9–10.3)
Chloride: 113 mmol/L — ABNORMAL HIGH (ref 98–111)
Creatinine, Ser: 1.7 mg/dL — ABNORMAL HIGH (ref 0.44–1.00)
GFR, Estimated: 31 mL/min — ABNORMAL LOW (ref >60)
Glucose, Bld: 86 mg/dL (ref 70–99)
Potassium: 4 mmol/L (ref 3.5–5.1)
Sodium: 139 mmol/L (ref 135–145)

## 2021-12-27 LAB — LIPASE, BLOOD: Lipase: 131 U/L — ABNORMAL HIGH (ref 11–51)

## 2021-12-27 LAB — MAGNESIUM: Magnesium: 2 mg/dL (ref 1.7–2.4)

## 2021-12-27 MED ORDER — AMLODIPINE BESYLATE 5 MG PO TABS
5.0000 mg | ORAL_TABLET | Freq: Every day | ORAL | Status: DC
  Administered 2021-12-27 – 2021-12-28 (×2): 5 mg via ORAL
  Filled 2021-12-27 (×2): qty 1

## 2021-12-27 NOTE — Evaluation (Signed)
Physical Therapy Evaluation Patient Details Name: Tamara Friedman MRN: [id number] DOB: 12/01/1950 Today's Date: 12/27/2021  History of Present Illness  Pt is a 71 year old with PMHx signficant for HTN, HLD, CKD3b admitted with c/o Ab pain, N/V, found to be in hypertension urgency  Clinical Impression  Pt admitted with above diagnosis. Pt currently with functional limitations due to the deficits listed below (see PT Problem List). Pt will benefit from skilled PT to increase their independence and safety with mobility to allow discharge to the venue listed below.   Pt very agreeable to mobilize and ambulated 280 ft in hallway with RW which she reports having RW at home.  Pt with recent admission in beginning of May and HHPT was recommended at that time.  Recommend continuing with HHPT upon d/c.      Recommendations for follow up therapy are one component of a multi-disciplinary discharge planning process, led by the attending physician.  Recommendations may be updated based on patient status, additional functional criteria and insurance authorization.  Follow Up Recommendations Home health PT    Assistance Recommended at Discharge PRN  Patient can return home with the following  A little help with walking and/or transfers;A little help with bathing/dressing/bathroom    Equipment Recommendations None recommended by PT  Recommendations for Other Services       Functional Status Assessment Patient has had a recent decline in their functional status and demonstrates the ability to make significant improvements in function in a reasonable and predictable amount of time.     Precautions / Restrictions Precautions Precautions: Fall      Mobility  Bed Mobility Overal bed mobility: Modified Independent                  Transfers Overall transfer level: Needs assistance Equipment used: Rolling walker (2 wheels) Transfers: Sit to/from Stand Sit to Stand: Min guard            General transfer comment: min/guard for safety    Ambulation/Gait Ambulation/Gait assistance: Min guard Gait Distance (Feet): 280 Feet Assistive device: Rolling walker (2 wheels) Gait Pattern/deviations: Step-through pattern, Decreased stride length       General Gait Details: appears steady with RW  Stairs            Wheelchair Mobility    Modified Rankin (Stroke Patients Only)       Balance Overall balance assessment: Mild deficits observed, not formally tested (reports generalized weakness, no recent falls per pt)                                           Pertinent Vitals/Pain Pain Assessment Pain Assessment: No/denies pain (reports she received pain meds earlier)    Home Living Family/patient expects to be discharged to:: Private residence Living Arrangements: Alone Available Help at Discharge: Family;Available PRN/intermittently Type of Home: Apartment Home Access: Stairs to enter   Entrance Stairs-Number of Steps: 1   Home Layout: One level Home Equipment: Cane - single Barista (2 wheels)      Prior Function Prior Level of Function : Independent/Modified Independent             Mobility Comments: does not drive, grandson assists with groceries and transportation, reports using RW       Hand Dominance        Extremity/Trunk Assessment  Lower Extremity Assessment Lower Extremity Assessment: Generalized weakness       Communication   Communication: No difficulties  Cognition Arousal/Alertness: Awake/alert Behavior During Therapy: WFL for tasks assessed/performed Overall Cognitive Status: No family/caregiver present to determine baseline cognitive functioning                                 General Comments: per chart review, grandson reports memory issues        General Comments      Exercises     Assessment/Plan    PT Assessment Patient needs continued PT services  PT  Problem List Decreased strength;Decreased activity tolerance;Decreased mobility       PT Treatment Interventions Gait training;DME instruction;Therapeutic exercise;Patient/family education;Therapeutic activities;Functional mobility training;Balance training    PT Goals (Current goals can be found in the Care Plan section)  Acute Rehab PT Goals PT Goal Formulation: With patient Time For Goal Achievement: 01/03/22 Potential to Achieve Goals: Good    Frequency Min 3X/week     Co-evaluation               AM-PAC PT "6 Clicks" Mobility  Outcome Measure Help needed turning from your back to your side while in a flat bed without using bedrails?: A Little Help needed moving from lying on your back to sitting on the side of a flat bed without using bedrails?: A Little Help needed moving to and from a bed to a chair (including a wheelchair)?: A Little Help needed standing up from a chair using your arms (e.g., wheelchair or bedside chair)?: A Little Help needed to walk in hospital room?: A Little Help needed climbing 3-5 steps with a railing? : A Little 6 Click Score: 18    End of Session   Activity Tolerance: Patient tolerated treatment well Patient left: in chair;with call bell/phone within reach;with chair alarm set   PT Visit Diagnosis: Difficulty in walking, not elsewhere classified (R26.2)    Time: 3790-2409 PT Time Calculation (min) (ACUTE ONLY): 16 min   Charges:   PT Evaluation $PT Eval Low Complexity: 1 Low        Kati PT, DPT Acute Rehabilitation Services Pager: [phone] Office: [place] 12/27/2021, 11:23 AM

## 2021-12-27 NOTE — Plan of Care (Signed)
  Problem: Clinical Measurements: Goal: Will remain free from infection Outcome: Progressing Goal: Diagnostic test results will improve Outcome: Progressing   Problem: Activity: Goal: Risk for activity intolerance will decrease Outcome: Progressing   Problem: Nutrition: Goal: Adequate nutrition will be maintained Outcome: Progressing   Problem: Elimination: Goal: Will not experience complications related to bowel motility Outcome: Progressing   Problem: Safety: Goal: Ability to remain free from injury will improve Outcome: Progressing

## 2021-12-27 NOTE — TOC Initial Note (Signed)
Transition of Care [facility]) - Initial/Assessment Note    Patient Details  Name: Tamara Friedman MRN: [id number] Date of Birth: 02-03-1951  Transition of Care [facility]) CM/SW Contact:    Leeroy Cha, RN Phone Number: 12/27/2021, 8:16 AM  Clinical Narrative:                  Transition of Care [facility]) Screening Note   Patient Details  Name: Tamara Friedman Date of Birth: [birth year]   Transition of Care [facility]) CM/SW Contact:    Leeroy Cha, RN Phone Number: 12/27/2021, 8:16 AM    Transition of Care Department [facility]) has reviewed patient and no TOC needs have been identified at this time. We will continue to monitor patient advancement through interdisciplinary progression rounds. If new patient transition needs arise, please place a TOC consult.    Expected Discharge Plan: Home/Self Care Barriers to Discharge: No Barriers Identified   Patient Goals and CMS Choice Patient states their goals for this hospitalization and ongoing recovery are:: toi go home CMS Medicare.gov Compare Post Acute Care list provided to:: Patient    Expected Discharge Plan and Services Expected Discharge Plan: Home/Self Care   Discharge Planning Services: CM Consult   Living arrangements for the past 2 months: Single Family Home                                      Prior Living Arrangements/Services Living arrangements for the past 2 months: Single Family Home Lives with:: Self Patient language and need for interpreter reviewed:: Yes Do you feel safe going back to the place where you live?: Yes      Need for Family Participation in Patient Care: Yes (Comment) (grandson) Care giver support system in place?: Yes (comment)   Criminal Activity/Legal Involvement Pertinent to Current Situation/Hospitalization: No - Comment as needed  Activities of Daily Living Home Assistive Devices/Equipment: None ADL Screening (condition at time of admission) Patient's cognitive ability adequate to  safely complete daily activities?: No Is the patient deaf or have difficulty hearing?: No Does the patient have difficulty seeing, even when wearing glasses/contacts?: No Does the patient have difficulty concentrating, remembering, or making decisions?: No Patient able to express need for assistance with ADLs?: Yes Does the patient have difficulty dressing or bathing?: No Independently performs ADLs?: Yes (appropriate for developmental age) Does the patient have difficulty walking or climbing stairs?: Yes Weakness of Legs: Both Weakness of Arms/Hands: Both  Permission Sought/Granted                  Emotional Assessment Appearance:: Appears stated age     Orientation: : Oriented to Self, Oriented to Place, Oriented to  Time, Oriented to Situation Alcohol / Substance Use: Not Applicable Psych Involvement: No (comment)  Admission diagnosis:  Hypertensive urgency [I16.0] Nausea and vomiting, unspecified vomiting type [R11.2] Patient Active Problem List   Diagnosis Date Noted   Stage 3b chronic kidney disease (CKD) ([place]) 12/25/2021   Abdominal pain 12/25/2021   Abnormal CT scan 12/25/2021   AKI (acute kidney injury) ([place]) 11/29/2021   Generalized weakness 11/29/2021   Acute pancreatitis 11/28/2021   Dehydration 11/28/2021   Intractable nausea and vomiting 11/28/2021   HLD (hyperlipidemia) 11/28/2021   Hypertensive urgency 04/28/2021   Acute renal failure superimposed on stage 4 chronic kidney disease ([place]) 04/27/2021   Elevated troponin 04/27/2021   HTN (hypertension) 04/27/2021   Chronic  CHF ([place]) 04/27/2021   PCP:  Lucianne Lei, MD Pharmacy:   [facility] [address] [place]), [place] - [address] [place]  [id number] Phone: [phone] Fax: [phone]     Social Determinants of Health (SDOH) Interventions    Readmission Risk Interventions     View : No data to display.

## 2021-12-27 NOTE — Progress Notes (Signed)
PROGRESS NOTE  MADDIE BRAZIER PIR:[id number] DOB: 03/09/1951 DOA: 12/25/2021 PCP: Lucianne Lei, MD  HPI/Recap of past 21 hours: 71 year old female with h/o HTN, HLD, CKD3b present to the hospital c/o Ab pain, N/V, found to be in hypertension urgency, started on cardene drip and admitted to stepdown unit.   Reports has not able to keep any blood pressure meds down due to n/v and diarrhea.  Symptoms started after her returned home from lunch.  CT abdomen and pelvis was unrevealing.  CT PCR and GI PCR panel was negative.  Her symptoms have now resolved.  Currently she has mild suprapubic tenderness with palpation.  12/27/21: The patient was seen and examined at bedside.  Reports suprapubic tenderness with palpation.  Also reports generalized weakness.  Hypovolemic on exam.  We will continue IV fluid hydration NS at 50 cc/h.  Assessment/Plan: Principal Problem:   Hypertensive urgency Active Problems:   HTN (hypertension)   Intractable nausea and vomiting   HLD (hyperlipidemia)   Stage 3b chronic kidney disease (CKD) ([facility])   Abdominal pain   Abnormal CT scan  HTN urgency BP is not at goal, elevated. -off cardene drip, transfer out of icu -appear to be on norvasc, coreg and clonidine at home -decrease clonidine to qhs, not sure clonidine is a good choice for this patient due to tendency of bradycardia, drowsiness, her memory issues and frequent GI issues Restarting home Norvasc due to elevated BPs Continue to closely monitor vital signs.     N/V/Ab pain: - CT ab /pel " No acute findings in the abdomen or pelvis." Did show "Large hiatal hernia, Sigmoid diverticulosis" I personally reviewed CT it also showed formed stool throughout the colon -she presents with diarrhea, overflow diarrhea? C diff and gi pcr panel ordered and collected on 5/28 were negative. -reports n/v resolved, she is tolerating a soft diet. -currently does has suprapubic tenderness, but does not appear to have  tenderness in other abdomen area Analgesics as needed     Airspace opacity in the right lung base concerning for pneumonia, on CT ab /pel -No leukocytosis, no fever -could be aspiration pneumonitis from N/V at home -Continue to monitor off antibiotics for now.      Resolved acute urinary retention  Continue Flomax -increase activity -avoid  constipation      Bacteriuria No fever, no leukocytosis Due to reports of suprapubic pain will get urine culture  Urine culture less than 10,000 colonies, insignificant growth   CKDIIIB Back to baseline renal function. Creatinine 1.7 with GFR of 31. Continue gentle IV fluid hydration NS at 50 cc/h x 1 day. Continue to avoid nephrotoxic agents and dehydration.   Elevated lipase No epigastric pain Ct ab/pel did not show abnormality in pancreas Repeat lipase level in the morning.     Incidental findings on CT scan Aortic atherosclerosis Continue home statin and aspirin.   Large hiatal hernia  Elevate bed, meal in chair F/u with GI Dr Benson [place]     FTT: She reports lives by herself in her apartment,  she does not drive, her grandson drives her if need be grandson reports patient is gradually become weaker  Seen by PT with recommendation for home health PT        Code Status: Full code  Family Communication: None at bedside  Disposition Plan: Likely will discharge to home possibly on 12/28/2021 with home health PT   Consultants: None.  Procedures: None.  Antimicrobials: None.  DVT prophylaxis: SCDs  Status is:  Inpatient The patient requires at least 2 midnights for further evaluation and treatment of present condition.    Objective: Vitals:   12/27/21 0243 12/27/21 0551 12/27/21 0822 12/27/21 1718  BP: 140/88 (!) 150/82 136/76 (!) 159/85  Pulse: (!) 55 (!) 58 63 74  Resp: '17 17  18  '$ Temp: 97.9 F (36.6 C) 97.8 F (36.6 C)  98.9 F (37.2 C)  TempSrc: Oral Oral  Oral  SpO2: 100% 99%  100%  Weight:       Height:        Intake/Output Summary (Last 24 hours) at 12/27/2021 1754 Last data filed at 12/27/2021 1649 Gross per 24 hour  Intake 1408.24 ml  Output 1450 ml  Net -41.76 ml   Filed Weights   12/25/21 0147 12/26/21 0500  Weight: 72.6 kg 71.6 kg    Exam:  General: 71 y.o. year-old female well developed well nourished in no acute distress.  Alert and interactive. Cardiovascular: Regular rate and rhythm with no rubs or gallops.  No thyromegaly or JVD noted.   Respiratory: Clear to auscultation with no wheezes or rales. Good inspiratory effort. Abdomen: Soft tenderness with palpation epigastric region.  Bowel sounds present.   Musculoskeletal: Trace lower extremity edema bilaterally. Skin: No ulcerative lesions noted or rashes, Psychiatry: Mood is appropriate for condition and setting   Data Reviewed: CBC: Recent Labs  Lab 12/25/21 0212 12/26/21 0304 12/27/21 0321  WBC 7.6 5.5 5.0  NEUTROABS 5.7  --  1.6*  HGB 13.6 10.5* 9.3*  HCT 43.3 35.3* 31.3*  MCV 86.8 89.6 88.9  PLT 279 213 109   Basic Metabolic Panel: Recent Labs  Lab 12/25/21 0436 12/26/21 0304 12/27/21 0321  NA 138 142 139  K 4.2 3.6 4.0  CL 107 118* 113*  CO2 19* 20* 21*  GLUCOSE 122* 89 86  BUN 22 32* 30*  CREATININE 1.58* 2.11* 1.70*  CALCIUM 9.2 8.2* 8.3*  MG  --   --  2.0   GFR: Estimated Creatinine Clearance: 29.9 mL/min (A) (by C-G formula based on SCr of 1.7 mg/dL (H)). Liver Function Tests: Recent Labs  Lab 12/25/21 0436 12/26/21 0304  AST 19 15  ALT 13 11  ALKPHOS 94 66  BILITOT 0.7 0.7  PROT 8.4* 6.3*  ALBUMIN 3.9 3.0*   Recent Labs  Lab 12/25/21 0436 12/27/21 0321  LIPASE 81* 131*   No results for input(s): AMMONIA in the last 168 hours. Coagulation Profile: No results for input(s): INR, PROTIME in the last 168 hours. Cardiac Enzymes: No results for input(s): CKTOTAL, CKMB, CKMBINDEX, TROPONINI in the last 168 hours. BNP (last 3 results) No results for input(s):  PROBNP in the last 8760 hours. HbA1C: No results for input(s): HGBA1C in the last 72 hours. CBG: Recent Labs  Lab 12/26/21 1112  GLUCAP 85   Lipid Profile: No results for input(s): CHOL, HDL, LDLCALC, TRIG, CHOLHDL, LDLDIRECT in the last 72 hours. Thyroid Function Tests: No results for input(s): TSH, T4TOTAL, FREET4, T3FREE, THYROIDAB in the last 72 hours. Anemia Panel: No results for input(s): VITAMINB12, FOLATE, FERRITIN, TIBC, IRON, RETICCTPCT in the last 72 hours. Urine analysis:    Component Value Date/Time   COLORURINE YELLOW 12/25/2021 0212   APPEARANCEUR HAZY (A) 12/25/2021 0212   LABSPEC 1.014 12/25/2021 0212   PHURINE 6.0 12/25/2021 0212   GLUCOSEU NEGATIVE 12/25/2021 0212   HGBUR NEGATIVE 12/25/2021 0212   BILIRUBINUR NEGATIVE 12/25/2021 0212   KETONESUR 5 (A) 12/25/2021 0212   PROTEINUR >=300 (A) 12/25/2021  9449   UROBILINOGEN 0.2 07/19/2013 2044   NITRITE NEGATIVE 12/25/2021 0212   LEUKOCYTESUR NEGATIVE 12/25/2021 0212   Sepsis Labs: '@LABRCNTIP'$ (procalcitonin:4,lacticidven:4)  ) Recent Results (from the past 240 hour(s))  MRSA Next Gen by PCR, Nasal     Status: None   Collection Time: 12/25/21 10:03 AM   Specimen: Nasal Mucosa; Nasal Swab  Result Value Ref Range Status   MRSA by PCR Next Gen NOT DETECTED NOT DETECTED Final    Comment: (NOTE) The GeneXpert MRSA Assay (FDA approved for NASAL specimens only), is one component of a comprehensive MRSA colonization surveillance program. It is not intended to diagnose MRSA infection nor to guide or monitor treatment for MRSA infections. Test performance is not FDA approved in patients less than 70 years old. Performed at [facility], [place] [address], [place], [place] [address]   Urine Culture     Status: Abnormal   Collection Time: 12/26/21  1:21 PM   Specimen: Urine, Clean Catch  Result Value Ref Range Status   Specimen Description   Final    URINE, CLEAN CATCH Performed at [facility], [place] [address], [place], [place] [address]    Special Requests   Final    NONE Performed at [facility], [place] [address] [place], [place] [address]    Culture (A)  Final    <10,000 COLONIES/mL INSIGNIFICANT GROWTH Performed at [place] [address], [place], [place] [address]    Report Status 12/27/2021 FINAL  Final  C Difficile Quick Screen w PCR reflex     Status: None   Collection Time: 12/26/21  1:50 PM   Specimen: STOOL  Result Value Ref Range Status   C Diff antigen NEGATIVE NEGATIVE Final   C Diff toxin NEGATIVE NEGATIVE Final   C Diff interpretation No C. difficile detected.  Final    Comment: Performed at [facility], [place] [address], [place], [place] [address]      Studies: No results found.  Scheduled Meds:  aspirin EC  81 mg Oral Daily   atorvastatin  10 mg Oral Daily   carvedilol  25 mg Oral BID WC   Chlorhexidine Gluconate Cloth  6 each Topical Daily   cloNIDine  0.2 mg Oral QHS   mouth rinse  15 mL Mouth Rinse BID   polyethylene glycol  17 g Oral Daily   senna-docusate  1 tablet Oral BID   sodium bicarbonate  650 mg Oral BID   tamsulosin  0.4 mg Oral QPC supper    Continuous Infusions:  sodium chloride 75 mL/hr at 12/27/21 0352     LOS: 2 days     Kayleen Memos, MD [facility] Pager [phone]  If 7PM-7AM, please contact night-coverage [email or web address] Password [facility] 12/27/2021, 5:54 PM

## 2021-12-28 DIAGNOSIS — I160 Hypertensive urgency: Secondary | ICD-10-CM | POA: Diagnosis not present

## 2021-12-28 LAB — LIPASE, BLOOD: Lipase: 97 U/L — ABNORMAL HIGH (ref 11–51)

## 2021-12-28 MED ORDER — MELATONIN 3 MG PO TABS
3.0000 mg | ORAL_TABLET | Freq: Once | ORAL | Status: AC
  Administered 2021-12-28: 3 mg via ORAL
  Filled 2021-12-28: qty 1

## 2021-12-28 MED ORDER — MAGNESIUM HYDROXIDE 400 MG/5ML PO SUSP
30.0000 mL | Freq: Every day | ORAL | Status: DC | PRN
  Administered 2021-12-28: 30 mL via ORAL
  Filled 2021-12-28 (×2): qty 30

## 2021-12-28 MED ORDER — AMLODIPINE BESYLATE 10 MG PO TABS
10.0000 mg | ORAL_TABLET | Freq: Every day | ORAL | Status: DC
  Administered 2021-12-29: 10 mg via ORAL
  Filled 2021-12-28: qty 1

## 2021-12-28 NOTE — Progress Notes (Signed)
Physical Therapy Treatment Patient Details Name: Tamara Friedman MRN: [id number] DOB: 1950-09-16 Today's Date: 12/28/2021   History of Present Illness Pt is a 71 year old with PMHx signficant for HTN, HLD, CKD3b admitted with c/o Ab pain, N/V, found to be in hypertension urgency    PT Comments    Pt appears upset this morning and crying.  Pt reports ovarian pain in response and feels frustrated that pain is not due to hiatal hernia as originally thought.  Pt states she just wants to feel better and not have to keep coming back to the hospital.  Pt was agreeable to ambulate however felt nausea with ambulating today.    Recommendations for follow up therapy are one component of a multi-disciplinary discharge planning process, led by the attending physician.  Recommendations may be updated based on patient status, additional functional criteria and insurance authorization.  Follow Up Recommendations  Home health PT     Assistance Recommended at Discharge PRN  Patient can return home with the following A little help with walking and/or transfers;A little help with bathing/dressing/bathroom   Equipment Recommendations  None recommended by PT    Recommendations for Other Services       Precautions / Restrictions Precautions Precautions: Fall     Mobility  Bed Mobility               General bed mobility comments: pt in recliner    Transfers Overall transfer level: Needs assistance Equipment used: Rolling walker (2 wheels) Transfers: Sit to/from Stand Sit to Stand: Min guard           General transfer comment: min/guard for safety    Ambulation/Gait Ambulation/Gait assistance: Min guard Gait Distance (Feet): 300 Feet Assistive device: Rolling walker (2 wheels) Gait Pattern/deviations: Step-through pattern, Decreased stride length       General Gait Details: appears steady with RW, slow cadence, distance limited by nausea per pt   Stairs              Wheelchair Mobility    Modified Rankin (Stroke Patients Only)       Balance Overall balance assessment: Needs assistance         Standing balance support: Bilateral upper extremity supported, Reliant on assistive device for balance                                Cognition Arousal/Alertness: Awake/alert Behavior During Therapy: WFL for tasks assessed/performed Overall Cognitive Status: No family/caregiver present to determine baseline cognitive functioning                                 General Comments: per chart review, grandson reports memory issues        Exercises      General Comments        Pertinent Vitals/Pain Pain Assessment Pain Assessment: Faces Faces Pain Scale: Hurts even more Pain Location: abdomen - pt reports ovarian pain Pain Descriptors / Indicators: Grimacing (RN in room and aware) Pain Intervention(s): Monitored during session, Repositioned    Home Living                          Prior Function            PT Goals (current goals can now be found in the care plan section) Progress towards  PT goals: Progressing toward goals    Frequency    Min 3X/week      PT Plan Current plan remains appropriate    Co-evaluation              AM-PAC PT "6 Clicks" Mobility   Outcome Measure  Help needed turning from your back to your side while in a flat bed without using bedrails?: A Little Help needed moving from lying on your back to sitting on the side of a flat bed without using bedrails?: A Little Help needed moving to and from a bed to a chair (including a wheelchair)?: A Little Help needed standing up from a chair using your arms (e.g., wheelchair or bedside chair)?: A Little Help needed to walk in hospital room?: A Little Help needed climbing 3-5 steps with a railing? : A Little 6 Click Score: 18    End of Session   Activity Tolerance: Patient tolerated treatment well Patient left:  in chair;with call bell/phone within reach;with chair alarm set   PT Visit Diagnosis: Difficulty in walking, not elsewhere classified (R26.2)     Time: 0867-6195 PT Time Calculation (min) (ACUTE ONLY): 19 min  Charges:  $Gait Training: 8-22 mins                    Arlyce Dice, DPT Acute Rehabilitation Services Pager: [phone] Office: [place] 12/28/2021, 2:26 PM

## 2021-12-28 NOTE — Progress Notes (Signed)
PROGRESS NOTE  Tamara Friedman:[id number] DOB: [birth year] DOA: 12/25/2021 PCP: Lucianne Lei, MD  HPI/Recap of past 26 hours: 71 year old female with h/o HTN, HLD, CKD3b present to the hospital c/o Ab pain, N/V, found to be in hypertension urgency, started on cardene drip and admitted to stepdown unit.   Reports has not able to keep any blood pressure meds down due to n/v and diarrhea.  Symptoms started after her returned home from lunch.  CT abdomen and pelvis was unrevealing.  CT PCR and GI PCR panel was negative.  Her symptoms have now resolved.  Currently she has mild suprapubic tenderness with palpation.  12/27/21: The patient was seen and examined at bedside.  Reports suprapubic tenderness with palpation.  Also reports generalized weakness.  Hypovolemic on exam.  We will continue IV fluid hydration NS at 50 cc/h.  5/30: Patient continued to have some bilateral lower quadrant pain.  No other urinary symptoms.  Patient did not had any bowel movement since in the hospital.  Continued to have some nausea and appetite remains very poor. Giving some milk of magnesia as remain constipated despite getting MiraLAX. PT is recommending home health services.  Assessment/Plan: Principal Problem:   Hypertensive urgency Active Problems:   HTN (hypertension)   Intractable nausea and vomiting   HLD (hyperlipidemia)   Stage 3b chronic kidney disease (CKD) (HCC)   Abdominal pain   Abnormal CT scan  HTN urgency. Initially required Cardene infusion.  Still mildly elevated -appear to be on norvasc, coreg and clonidine at home -decrease clonidine to qhs, not sure clonidine is a good choice for this patient due to tendency of bradycardia, drowsiness, her memory issues and frequent GI issues Restarting home Norvasc due to elevated BPs Continue to closely monitor vital signs.     N/V/Ab pain: - CT ab /pel " No acute findings in the abdomen or pelvis." Did show "Large hiatal hernia, Sigmoid  diverticulosis" I personally reviewed CT it also showed formed stool throughout the colon.  Constipation can be contributory to her symptoms. -she presents with diarrhea, overflow diarrhea? C diff and gi pcr panel ordered and collected on 5/28 were negative. -Add milk of magnesia with MiraLAX.   Airspace opacity in the right lung base concerning for pneumonia, on CT ab /pel -No leukocytosis, no fever -could be aspiration pneumonitis from N/V at home -Continue to monitor off antibiotics for now.     Resolved acute urinary retention  Continue Flomax -increase activity -avoid  constipation     Bacteriuria No fever, no leukocytosis Due to reports of suprapubic pain will get urine culture  Urine culture less than 10,000 colonies, insignificant growth   CKDIIIB Back to baseline renal function. Creatinine 1.7 with GFR of 31. Continue gentle IV fluid hydration NS at 50 cc/h x 1 day. Continue to avoid nephrotoxic agents and dehydration.   Elevated lipase. Seems improving. No epigastric pain Ct ab/pel did not show abnormality in pancreas    Incidental findings on CT scan Aortic atherosclerosis Continue home statin and aspirin.   Large hiatal hernia  Elevate bed, meal in chair F/u with GI Dr Benson [place]    FTT: She reports lives by herself in her apartment,  she does not drive, her grandson drives her if need be grandson reports patient is gradually become weaker  Seen by PT with recommendation for home health PT     Code Status: Full code  Family Communication: None at bedside  Disposition Plan: Can be discharged home tomorrow  if remains stable.  Consultants: None.  Procedures: None.  Antimicrobials: None.  DVT prophylaxis: SCDs  Status is: Inpatient The patient requires at least 2 midnights for further evaluation and treatment of present condition.    Objective: Vitals:   12/28/21 0400 12/28/21 0500 12/28/21 0530 12/28/21 1234  BP:   (!) 159/91 (!) 152/96   Pulse:   78 69  Resp: '16 15 20 18  '$ Temp:   97.8 F (36.6 C) (!) 97.5 F (36.4 C)  TempSrc:   Oral Oral  SpO2:   100% 100%  Weight:      Height:        Intake/Output Summary (Last 24 hours) at 12/28/2021 1508 Last data filed at 12/28/2021 1038 Gross per 24 hour  Intake 120 ml  Output 1100 ml  Net -980 ml    Filed Weights   12/25/21 0147 12/26/21 0500  Weight: 72.6 kg 71.6 kg    Exam:  General.     In no acute distress. Pulmonary.  Lungs clear bilaterally, normal respiratory effort. CV.  Regular rate and rhythm, no JVD, rub or murmur. Abdomen.  Soft, nontender, nondistended, BS positive. CNS.  Alert and oriented .  No focal neurologic deficit. Extremities.  No edema, no cyanosis, pulses intact and symmetrical. Psychiatry.  Judgment and insight appears normal.   Data Reviewed: CBC: Recent Labs  Lab 12/25/21 0212 12/26/21 0304 12/27/21 0321  WBC 7.6 5.5 5.0  NEUTROABS 5.7  --  1.6*  HGB 13.6 10.5* 9.3*  HCT 43.3 35.3* 31.3*  MCV 86.8 89.6 88.9  PLT 279 213 660    Basic Metabolic Panel: Recent Labs  Lab 12/25/21 0436 12/26/21 0304 12/27/21 0321  NA 138 142 139  K 4.2 3.6 4.0  CL 107 118* 113*  CO2 19* 20* 21*  GLUCOSE 122* 89 86  BUN 22 32* 30*  CREATININE 1.58* 2.11* 1.70*  CALCIUM 9.2 8.2* 8.3*  MG  --   --  2.0    GFR: Estimated Creatinine Clearance: 29.9 mL/min (A) (by C-G formula based on SCr of 1.7 mg/dL (H)). Liver Function Tests: Recent Labs  Lab 12/25/21 0436 12/26/21 0304  AST 19 15  ALT 13 11  ALKPHOS 94 66  BILITOT 0.7 0.7  PROT 8.4* 6.3*  ALBUMIN 3.9 3.0*    Recent Labs  Lab 12/25/21 0436 12/27/21 0321 12/28/21 0355  LIPASE 81* 131* 97*    No results for input(s): AMMONIA in the last 168 hours. Coagulation Profile: No results for input(s): INR, PROTIME in the last 168 hours. Cardiac Enzymes: No results for input(s): CKTOTAL, CKMB, CKMBINDEX, TROPONINI in the last 168 hours. BNP (last 3 results) No results for  input(s): PROBNP in the last 8760 hours. HbA1C: No results for input(s): HGBA1C in the last 72 hours. CBG: Recent Labs  Lab 12/26/21 1112  GLUCAP 85    Lipid Profile: No results for input(s): CHOL, HDL, LDLCALC, TRIG, CHOLHDL, LDLDIRECT in the last 72 hours. Thyroid Function Tests: No results for input(s): TSH, T4TOTAL, FREET4, T3FREE, THYROIDAB in the last 72 hours. Anemia Panel: No results for input(s): VITAMINB12, FOLATE, FERRITIN, TIBC, IRON, RETICCTPCT in the last 72 hours. Urine analysis:    Component Value Date/Time   COLORURINE YELLOW 12/25/2021 0212   APPEARANCEUR HAZY (A) 12/25/2021 0212   LABSPEC 1.014 12/25/2021 0212   PHURINE 6.0 12/25/2021 0212   GLUCOSEU NEGATIVE 12/25/2021 0212   HGBUR NEGATIVE 12/25/2021 0212   BILIRUBINUR NEGATIVE 12/25/2021 0212   KETONESUR 5 (A) 12/25/2021 6301  PROTEINUR >=300 (A) 12/25/2021 0212   UROBILINOGEN 0.2 07/19/2013 2044   NITRITE NEGATIVE 12/25/2021 0212   LEUKOCYTESUR NEGATIVE 12/25/2021 0212   Sepsis Labs: '@LABRCNTIP'$ (procalcitonin:4,lacticidven:4)  ) Recent Results (from the past 240 hour(s))  MRSA Next Gen by PCR, Nasal     Status: None   Collection Time: 12/25/21 10:03 AM   Specimen: Nasal Mucosa; Nasal Swab  Result Value Ref Range Status   MRSA by PCR Next Gen NOT DETECTED NOT DETECTED Final    Comment: (NOTE) The GeneXpert MRSA Assay (FDA approved for NASAL specimens only), is one component of a comprehensive MRSA colonization surveillance program. It is not intended to diagnose MRSA infection nor to guide or monitor treatment for MRSA infections. Test performance is not FDA approved in patients less than 54 years old. Performed at [facility], [place] [address], [place], [place] [address]   Urine Culture     Status: Abnormal   Collection Time: 12/26/21  1:21 PM   Specimen: Urine, Clean Catch  Result Value Ref Range Status   Specimen Description   Final    URINE, CLEAN CATCH Performed at  [facility], [place] [address] [place], [place] [address]    Special Requests   Final    NONE Performed at [facility], [place] [address], [place], [place] [address]    Culture (A)  Final    <10,000 COLONIES/mL INSIGNIFICANT GROWTH Performed at [place] [address], [place], [place] [address]    Report Status 12/27/2021 FINAL  Final  C Difficile Quick Screen w PCR reflex     Status: None   Collection Time: 12/26/21  1:50 PM   Specimen: STOOL  Result Value Ref Range Status   C Diff antigen NEGATIVE NEGATIVE Final   C Diff toxin NEGATIVE NEGATIVE Final   C Diff interpretation No C. difficile detected.  Final    Comment: Performed at [facility], [place] [address] [place], [place] [address]      Studies: No results found.  Scheduled Meds:  [START ON 12/29/2021] amLODipine  10 mg Oral Daily   aspirin EC  81 mg Oral Daily   atorvastatin  10 mg Oral Daily   carvedilol  25 mg Oral BID WC   Chlorhexidine Gluconate Cloth  6 each Topical Daily   cloNIDine  0.2 mg Oral QHS   mouth rinse  15 mL Mouth Rinse BID   polyethylene glycol  17 g Oral Daily   senna-docusate  1 tablet Oral BID   tamsulosin  0.4 mg Oral QPC supper    Continuous Infusions:     LOS: 3 days    Lorella Nimrod, MD [facility] Pager [phone]  If 7PM-7AM, please contact night-coverage [email or web address] Password [email or web address] 12/28/2021, 3:08 PM

## 2021-12-29 DIAGNOSIS — I10 Essential (primary) hypertension: Secondary | ICD-10-CM | POA: Diagnosis not present

## 2021-12-29 DIAGNOSIS — I160 Hypertensive urgency: Secondary | ICD-10-CM | POA: Diagnosis not present

## 2021-12-29 DIAGNOSIS — E875 Hyperkalemia: Secondary | ICD-10-CM | POA: Diagnosis not present

## 2021-12-29 MED ORDER — MAGNESIUM HYDROXIDE 400 MG/5ML PO SUSP: 30.0000 mL | Freq: Every day | ORAL | 0 refills | Status: AC | PRN

## 2021-12-29 MED ORDER — POLYETHYLENE GLYCOL 3350 17 G PO PACK: 17.0000 g | PACK | Freq: Every day | ORAL | 0 refills | Status: DC

## 2021-12-29 MED ORDER — AMLODIPINE BESYLATE 10 MG PO TABS
10.0000 mg | ORAL_TABLET | Freq: Every day | ORAL | 0 refills | Status: AC
Start: 2021-12-30 — End: ?

## 2021-12-29 NOTE — TOC Transition Note (Signed)
Transition of Care [facility]) - CM/SW Discharge Note   Patient Details  Name: Tamara Friedman MRN: [id number] Date of Birth: 1950/09/19  [facility] of [facility] CM/SW Contact:  Leeroy Cha, RN Phone Number: 12/29/2021, 9:19 AM   Clinical Narrative:    Dcd to home with no toc needs   Final next level of care: Home/Self Care Barriers to Discharge: No Barriers Identified   Patient Goals and CMS Choice Patient states their goals for this hospitalization and ongoing recovery are:: toi go home CMS Medicare.gov Compare Post Acute Care list provided to:: Patient    Discharge Placement                       Discharge Plan and Services   Discharge Planning Services: CM Consult                                 Social Determinants of Health (SDOH) Interventions     Readmission Risk Interventions     View : No data to display.

## 2021-12-29 NOTE — Discharge Summary (Signed)
Physician Discharge Summary  Tamara Friedman JJK:[id number] DOB: 1951/07/08 DOA: 12/25/2021  PCP: Lucianne Lei, MD  Admit date: 12/25/2021 Discharge date: 12/29/2021  Admitted From: home Discharge disposition: home   Recommendations for Outpatient Follow-Up:   BP check at next office visit Home health   Discharge Diagnosis:   Principal Problem:   Hypertensive urgency Active Problems:   HTN (hypertension)   Intractable nausea and vomiting   HLD (hyperlipidemia)   Stage 3b chronic kidney disease (CKD) ([place])   Abdominal pain   Abnormal CT scan    Discharge Condition: Improved.  Diet recommendation: Low sodium, heart healthy.  Carbohydrate-modified.   Wound care: None.  Code status: Full.   History of Present Illness:   71 year old female with h/o HTN, HLD, CKD3b present to the hospital c/o Ab pain, N/V, found to be in hypertension urgency, started on cardene drip and admitted to stepdown unit.   Reports has not able to keep any blood pressure meds down due to n/v and diarrhea.  Symptoms started after her returned home from lunch.  CT abdomen and pelvis was unrevealing.  CT PCR and GI PCR panel was negative.  Her symptoms have now resolved.  Currently she has mild suprapubic tenderness with palpation.   Hospital Course by Problem:   HTN urgency. -adjust BP meds -defer to cards outpatient further adjustment of clonidine, may not be the best choice for patient    N/V/Ab pain: - resolved after BM    Airspace opacity in the right lung base concerning for pneumonia, on CT ab /pel -No leukocytosis, no fever -could be aspiration pneumonitis from N/V at home    Resolved acute urinary retention  Continue Flomax -increase activity -avoid  constipation     Bacteriuria No fever, no leukocytosis  CKDIIIB Back to baseline renal function.   Elevated lipase. Seems improving. No epigastric pain Ct ab/pel did not show abnormality in pancreas    Incidental  findings on CT scan Aortic atherosclerosis Continue home statin and aspirin.   Large hiatal hernia -small frequent meals -upright after meals F/u with GI Dr Benson [place]    FTT: She reports lives by herself in her apartment,  she does not drive, her grandson drives her if need be grandson reports patient is gradually become weaker  Seen by PT with recommendation for home health PT        Medical Consultants:      Discharge Exam:   Vitals:   12/28/21 2117 12/29/21 0538  BP: (!) 152/94 (!) 176/93  Pulse: 69 67  Resp: 20 20  Temp: 98.4 F (36.9 C) 98.4 F (36.9 C)  SpO2: 100% 100%   Vitals:   12/28/21 0530 12/28/21 1234 12/28/21 2117 12/29/21 0538  BP: (!) 159/91 (!) 152/96 (!) 152/94 (!) 176/93  Pulse: 78 69 69 67  Resp: '20 18 20 20  '$ Temp: 97.8 F (36.6 C) (!) 97.5 F (36.4 C) 98.4 F (36.9 C) 98.4 F (36.9 C)  TempSrc: Oral Oral Oral Oral  SpO2: 100% 100% 100% 100%  Weight:      Height:        General exam: Appears calm and comfortable. Asking to go home   The results of significant diagnostics from this hospitalization (including imaging, microbiology, ancillary and laboratory) are listed below for reference.     Procedures and Diagnostic Studies:   CT ABDOMEN PELVIS WO CONTRAST  Result Date: 12/25/2021 CLINICAL DATA:  Left lower quadrant pain EXAM: CT ABDOMEN AND  PELVIS WITHOUT CONTRAST TECHNIQUE: Multidetector CT imaging of the abdomen and pelvis was performed following the standard protocol without IV contrast. RADIATION DOSE REDUCTION: This exam was performed according to the departmental dose-optimization program which includes automated exposure control, adjustment of the mA and/or kV according to patient size and/or use of iterative reconstruction technique. COMPARISON:  11/28/2021 FINDINGS: Lower chest: Large hiatal hernia. Airspace disease in the right lower lobe concerning for pneumonia. No effusions. Hepatobiliary: No focal hepatic abnormality.  Gallbladder unremarkable. Pancreas: No focal abnormality or ductal dilatation. Spleen: No focal abnormality.  Normal size. Adrenals/Urinary Tract: No renal or ureteral stones. No hydronephrosis. Low-density lesions within the left kidney are unchanged since prior study and likely reflects cysts. Adrenal glands and urinary bladder unremarkable. Stomach/Bowel: Normal appendix. Sigmoid diverticulosis. No active diverticulitis. Stomach and small bowel decompressed, unremarkable. Vascular/Lymphatic: Aortic atherosclerosis. No evidence of aneurysm or adenopathy. Reproductive: Prior hysterectomy.  No adnexal masses. Other: No free fluid or free air. Small umbilical hernia containing fat. Musculoskeletal: No acute bony abnormality. IMPRESSION: Large hiatal hernia. Airspace opacity in the right lung base concerning for pneumonia. Aortic atherosclerosis. Sigmoid diverticulosis. No acute findings in the abdomen or pelvis. Electronically Signed   By: Rolm Baptise M.D.   On: 12/25/2021 03:44     Labs:   Basic Metabolic Panel: Recent Labs  Lab 12/25/21 0436 12/26/21 0304 12/27/21 0321  NA 138 142 139  K 4.2 3.6 4.0  CL 107 118* 113*  CO2 19* 20* 21*  GLUCOSE 122* 89 86  BUN 22 32* 30*  CREATININE 1.58* 2.11* 1.70*  CALCIUM 9.2 8.2* 8.3*  MG  --   --  2.0   GFR Estimated Creatinine Clearance: 29.9 mL/min (A) (by C-G formula based on SCr of 1.7 mg/dL (H)). Liver Function Tests: Recent Labs  Lab 12/25/21 0436 12/26/21 0304  AST 19 15  ALT 13 11  ALKPHOS 94 66  BILITOT 0.7 0.7  PROT 8.4* 6.3*  ALBUMIN 3.9 3.0*   Recent Labs  Lab 12/25/21 0436 12/27/21 0321 12/28/21 0355  LIPASE 81* 131* 97*   No results for input(s): AMMONIA in the last 168 hours. Coagulation profile No results for input(s): INR, PROTIME in the last 168 hours.  CBC: Recent Labs  Lab 12/25/21 0212 12/26/21 0304 12/27/21 0321  WBC 7.6 5.5 5.0  NEUTROABS 5.7  --  1.6*  HGB 13.6 10.5* 9.3*  HCT 43.3 35.3* 31.3*   MCV 86.8 89.6 88.9  PLT 279 213 198   Cardiac Enzymes: No results for input(s): CKTOTAL, CKMB, CKMBINDEX, TROPONINI in the last 168 hours. BNP: Invalid input(s): POCBNP CBG: Recent Labs  Lab 12/26/21 1112  GLUCAP 85   D-Dimer No results for input(s): DDIMER in the last 72 hours. Hgb A1c No results for input(s): HGBA1C in the last 72 hours. Lipid Profile No results for input(s): CHOL, HDL, LDLCALC, TRIG, CHOLHDL, LDLDIRECT in the last 72 hours. Thyroid function studies No results for input(s): TSH, T4TOTAL, T3FREE, THYROIDAB in the last 72 hours.  Invalid input(s): FREET3 Anemia work up No results for input(s): VITAMINB12, FOLATE, FERRITIN, TIBC, IRON, RETICCTPCT in the last 72 hours. Microbiology Recent Results (from the past 240 hour(s))  MRSA Next Gen by PCR, Nasal     Status: None   Collection Time: 12/25/21 10:03 AM   Specimen: Nasal Mucosa; Nasal Swab  Result Value Ref Range Status   MRSA by PCR Next Gen NOT DETECTED NOT DETECTED Final    Comment: (NOTE) The GeneXpert MRSA Assay (FDA approved  for NASAL specimens only), is one component of a comprehensive MRSA colonization surveillance program. It is not intended to diagnose MRSA infection nor to guide or monitor treatment for MRSA infections. Test performance is not FDA approved in patients less than 51 years old. Performed at [facility], [place] [address], [place], [place] [address]   Urine Culture     Status: Abnormal   Collection Time: 12/26/21  1:21 PM   Specimen: Urine, Clean Catch  Result Value Ref Range Status   Specimen Description   Final    URINE, CLEAN CATCH Performed at [facility], [place] [address] [place], [place] [address]    Special Requests   Final    NONE Performed at [facility], [place] [address] [place], [place] [address]    Culture (A)  Final    <10,000 COLONIES/mL INSIGNIFICANT GROWTH Performed at [place] [address], [place], [place] [address]    Report Status 12/27/2021 FINAL  Final  C Difficile Quick Screen w PCR reflex     Status: None   Collection Time: 12/26/21  1:50 PM   Specimen: STOOL  Result Value Ref Range Status   C Diff antigen NEGATIVE NEGATIVE Final   C Diff toxin NEGATIVE NEGATIVE Final   C Diff interpretation No C. difficile detected.  Final    Comment: Performed at [facility], [place] [address], [place],  [address]     Discharge Instructions:   Discharge Instructions     Diet - low sodium heart healthy   Complete by: As directed    Discharge instructions   Complete by: As directed    Can take OTC magnesium supplements every night   Increase activity slowly   Complete by: As directed       Allergies as of 12/29/2021   No Known Allergies      Medication List     STOP taking these medications    pantoprazole 40 MG tablet Commonly known as: Protonix       TAKE these medications    amLODipine 10 MG tablet Commonly known as: NORVASC Take 1 tablet (10 mg total) by mouth daily. Start taking on: December 30, 2021 What changed:  medication strength how much to take   aspirin 81 MG tablet Take 81 mg by mouth daily.   atorvastatin 10 MG tablet Commonly known as: LIPITOR Take 10 mg by mouth daily.   carvedilol 25 MG tablet Commonly known as: COREG Take 25 mg by mouth 2 (two) times daily with a meal.   cloNIDine 0.2 MG tablet Commonly known as: CATAPRES Take 0.2 mg by mouth 3 (three) times daily.   magnesium hydroxide 400 MG/5ML suspension Commonly known as: MILK OF MAGNESIA Take 30 mLs by mouth daily as needed for mild constipation.   Multivitamin Adults Tabs Take 1 tablet by mouth daily.   ondansetron 8 MG disintegrating tablet Commonly known as: ZOFRAN-ODT Take 1 tablet (8 mg total) by mouth every 8 (eight) hours as needed for nausea or vomiting.   polyethylene glycol 17 g packet Commonly known as: MIRALAX /  GLYCOLAX Take 17 g by mouth daily. Start taking on: December 30, 2021        Follow-up Information     Lucianne Lei, MD Follow up in 1 week(s).   Specialty: Family Medicine Why: hospital discharge follow up please check your blood pressure at home twice a day, bring in record for your pcp  to review Contact information: [place] [address]  [place] [address] [phone]         Carol Ada, MD Follow up in 3 week(s).   Specialty: Gastroenterology Why: for hiatal hernia, N/V, constipation Contact information: [place], [place] [address] [phone]         Charolette Forward, MD Follow up.   Specialty: Cardiology Contact information: [place] [address] [phone]                  Time coordinating discharge: 45 min  Signed:  Geradine Girt DO  [facility] 12/29/2021, 9:14 AM

## 2021-12-29 NOTE — Plan of Care (Signed)
  Problem: Education: Goal: Knowledge of General Education information will improve Description: Including pain rating scale, medication(s)/side effects and non-pharmacologic comfort measures 12/29/2021 1101 by Zadie Rhine, RN Outcome: Adequate for Discharge 12/29/2021 0847 by Zadie Rhine, RN Outcome: Progressing   Problem: Health Behavior/Discharge Planning: Goal: Ability to manage health-related needs will improve 12/29/2021 1101 by Zadie Rhine, RN Outcome: Adequate for Discharge 12/29/2021 0847 by Zadie Rhine, RN Outcome: Progressing   Problem: Clinical Measurements: Goal: Ability to maintain clinical measurements within normal limits will improve 12/29/2021 1101 by Zadie Rhine, RN Outcome: Adequate for Discharge 12/29/2021 0847 by Zadie Rhine, RN Outcome: Progressing Goal: Will remain free from infection 12/29/2021 1101 by Zadie Rhine, RN Outcome: Adequate for Discharge 12/29/2021 0847 by Zadie Rhine, RN Outcome: Progressing Goal: Diagnostic test results will improve 12/29/2021 1101 by Zadie Rhine, RN Outcome: Adequate for Discharge 12/29/2021 0847 by Zadie Rhine, RN Outcome: Progressing Goal: Respiratory complications will improve Outcome: Adequate for Discharge Goal: Cardiovascular complication will be avoided Outcome: Adequate for Discharge   Problem: Activity: Goal: Risk for activity intolerance will decrease Outcome: Adequate for Discharge   Problem: Nutrition: Goal: Adequate nutrition will be maintained Outcome: Adequate for Discharge   Problem: Coping: Goal: Level of anxiety will decrease Outcome: Adequate for Discharge   Problem: Elimination: Goal: Will not experience complications related to bowel motility Outcome: Adequate for Discharge Goal: Will not experience complications related to urinary retention Outcome: Adequate for Discharge   Problem: Pain Managment: Goal: General experience of comfort  will improve Outcome: Adequate for Discharge   Problem: Safety: Goal: Ability to remain free from injury will improve Outcome: Adequate for Discharge   Problem: Skin Integrity: Goal: Risk for impaired skin integrity will decrease Outcome: Adequate for Discharge

## 2021-12-29 NOTE — Plan of Care (Signed)

## 2021-12-29 NOTE — Progress Notes (Signed)
Pt discharged to home instructions reviewed with pt. Acknowledged understanding of instructions. Family transported pt home. SRP RN

## 2022-01-06 DIAGNOSIS — I129 Hypertensive chronic kidney disease with stage 1 through stage 4 chronic kidney disease, or unspecified chronic kidney disease: Secondary | ICD-10-CM | POA: Diagnosis not present

## 2022-01-06 DIAGNOSIS — E782 Mixed hyperlipidemia: Secondary | ICD-10-CM | POA: Diagnosis not present

## 2022-01-06 DIAGNOSIS — I10 Essential (primary) hypertension: Secondary | ICD-10-CM | POA: Diagnosis not present

## 2022-01-06 DIAGNOSIS — R739 Hyperglycemia, unspecified: Secondary | ICD-10-CM | POA: Diagnosis not present

## 2022-01-07 DIAGNOSIS — N189 Chronic kidney disease, unspecified: Secondary | ICD-10-CM | POA: Diagnosis not present

## 2022-01-07 DIAGNOSIS — E785 Hyperlipidemia, unspecified: Secondary | ICD-10-CM | POA: Diagnosis not present

## 2022-01-07 DIAGNOSIS — I509 Heart failure, unspecified: Secondary | ICD-10-CM | POA: Diagnosis not present

## 2022-01-07 DIAGNOSIS — I10 Essential (primary) hypertension: Secondary | ICD-10-CM | POA: Diagnosis not present

## 2022-01-21 DIAGNOSIS — I10 Essential (primary) hypertension: Secondary | ICD-10-CM | POA: Diagnosis not present

## 2022-01-28 DIAGNOSIS — I10 Essential (primary) hypertension: Secondary | ICD-10-CM | POA: Diagnosis not present

## 2022-01-28 DIAGNOSIS — E785 Hyperlipidemia, unspecified: Secondary | ICD-10-CM | POA: Diagnosis not present

## 2022-03-12 ENCOUNTER — Emergency Department (HOSPITAL_COMMUNITY): Payer: Medicare Other

## 2022-03-12 ENCOUNTER — Other Ambulatory Visit: Payer: Self-pay

## 2022-03-12 ENCOUNTER — Encounter (HOSPITAL_COMMUNITY): Payer: Self-pay | Admitting: Emergency Medicine

## 2022-03-12 ENCOUNTER — Inpatient Hospital Stay (HOSPITAL_COMMUNITY)
Admission: EM | Admit: 2022-03-12 | Discharge: 2022-03-16 | DRG: 305 | Disposition: A | Discharge status: Home / Self Care | Payer: Medicare Other | Attending: Internal Medicine | Admitting: Internal Medicine

## 2022-03-12 DIAGNOSIS — R1084 Generalized abdominal pain: Secondary | ICD-10-CM | POA: Diagnosis present

## 2022-03-12 DIAGNOSIS — I160 Hypertensive urgency: Secondary | ICD-10-CM | POA: Diagnosis not present

## 2022-03-12 DIAGNOSIS — I129 Hypertensive chronic kidney disease with stage 1 through stage 4 chronic kidney disease, or unspecified chronic kidney disease: Secondary | ICD-10-CM | POA: Diagnosis not present

## 2022-03-12 DIAGNOSIS — K5900 Constipation, unspecified: Secondary | ICD-10-CM | POA: Diagnosis present

## 2022-03-12 DIAGNOSIS — E7800 Pure hypercholesterolemia, unspecified: Secondary | ICD-10-CM | POA: Diagnosis present

## 2022-03-12 DIAGNOSIS — R112 Nausea with vomiting, unspecified: Secondary | ICD-10-CM | POA: Diagnosis not present

## 2022-03-12 DIAGNOSIS — Z7982 Long term (current) use of aspirin: Secondary | ICD-10-CM | POA: Diagnosis not present

## 2022-03-12 DIAGNOSIS — K921 Melena: Secondary | ICD-10-CM | POA: Diagnosis not present

## 2022-03-12 DIAGNOSIS — Z66 Do not resuscitate: Secondary | ICD-10-CM | POA: Diagnosis present

## 2022-03-12 DIAGNOSIS — I161 Hypertensive emergency: Principal | ICD-10-CM

## 2022-03-12 DIAGNOSIS — N184 Chronic kidney disease, stage 4 (severe): Secondary | ICD-10-CM | POA: Diagnosis present

## 2022-03-12 DIAGNOSIS — N390 Urinary tract infection, site not specified: Secondary | ICD-10-CM | POA: Diagnosis present

## 2022-03-12 DIAGNOSIS — E875 Hyperkalemia: Secondary | ICD-10-CM | POA: Diagnosis not present

## 2022-03-12 DIAGNOSIS — B9620 Unspecified Escherichia coli [E. coli] as the cause of diseases classified elsewhere: Secondary | ICD-10-CM | POA: Diagnosis present

## 2022-03-12 DIAGNOSIS — R319 Hematuria, unspecified: Secondary | ICD-10-CM | POA: Diagnosis present

## 2022-03-12 DIAGNOSIS — Z90711 Acquired absence of uterus with remaining cervical stump: Secondary | ICD-10-CM

## 2022-03-12 DIAGNOSIS — N2889 Other specified disorders of kidney and ureter: Secondary | ICD-10-CM | POA: Diagnosis present

## 2022-03-12 DIAGNOSIS — N179 Acute kidney failure, unspecified: Secondary | ICD-10-CM | POA: Diagnosis not present

## 2022-03-12 DIAGNOSIS — R1031 Right lower quadrant pain: Secondary | ICD-10-CM | POA: Diagnosis not present

## 2022-03-12 DIAGNOSIS — Z79899 Other long term (current) drug therapy: Secondary | ICD-10-CM | POA: Diagnosis not present

## 2022-03-12 DIAGNOSIS — R109 Unspecified abdominal pain: Secondary | ICD-10-CM | POA: Diagnosis present

## 2022-03-12 DIAGNOSIS — I10 Essential (primary) hypertension: Secondary | ICD-10-CM | POA: Diagnosis present

## 2022-03-12 DIAGNOSIS — E785 Hyperlipidemia, unspecified: Secondary | ICD-10-CM | POA: Diagnosis present

## 2022-03-12 LAB — URINALYSIS, ROUTINE W REFLEX MICROSCOPIC
Bilirubin Urine: NEGATIVE
Bilirubin Urine: NEGATIVE
Glucose, UA: NEGATIVE mg/dL
Glucose, UA: NEGATIVE mg/dL
Ketones, ur: NEGATIVE mg/dL
Ketones, ur: NEGATIVE mg/dL
Nitrite: NEGATIVE
Nitrite: NEGATIVE
Protein, ur: >=300 mg/dL — AB
Protein, ur: >=300 mg/dL — AB
Specific Gravity, Urine: 1.017 (ref 1.005–1.030)
Specific Gravity, Urine: 1.018 (ref 1.005–1.030)
WBC, UA: >50 WBC/hpf — ABNORMAL HIGH (ref 0–5)
WBC, UA: >50 WBC/hpf — ABNORMAL HIGH (ref 0–5)
pH: 5 (ref 5.0–8.0)
pH: 5 (ref 5.0–8.0)

## 2022-03-12 LAB — COMPREHENSIVE METABOLIC PANEL
ALT: 18 U/L (ref 0–44)
AST: 22 U/L (ref 15–41)
Albumin: 4.4 g/dL (ref 3.5–5.0)
Alkaline Phosphatase: 106 U/L (ref 38–126)
Anion gap: 12 (ref 5–15)
BUN: 37 mg/dL — ABNORMAL HIGH (ref 8–23)
CO2: 19 mmol/L — ABNORMAL LOW (ref 22–32)
Calcium: 11.9 mg/dL — ABNORMAL HIGH (ref 8.9–10.3)
Chloride: 112 mmol/L — ABNORMAL HIGH (ref 98–111)
Creatinine, Ser: 2.24 mg/dL — ABNORMAL HIGH (ref 0.44–1.00)
GFR, Estimated: 23 mL/min — ABNORMAL LOW (ref >60)
Glucose, Bld: 165 mg/dL — ABNORMAL HIGH (ref 70–99)
Potassium: 4.6 mmol/L (ref 3.5–5.1)
Sodium: 143 mmol/L (ref 135–145)
Total Bilirubin: 0.6 mg/dL (ref 0.3–1.2)
Total Protein: 9.2 g/dL — ABNORMAL HIGH (ref 6.5–8.1)

## 2022-03-12 LAB — CBC
HCT: 46.1 % — ABNORMAL HIGH (ref 36.0–46.0)
Hemoglobin: 14.2 g/dL (ref 12.0–15.0)
MCH: 25.5 pg — ABNORMAL LOW (ref 26.0–34.0)
MCHC: 30.8 g/dL (ref 30.0–36.0)
MCV: 82.9 fL (ref 80.0–100.0)
Platelets: 438 10*3/uL — ABNORMAL HIGH (ref 150–400)
RBC: 5.56 MIL/uL — ABNORMAL HIGH (ref 3.87–5.11)
RDW: 15.4 % (ref 11.5–15.5)
WBC: 10.2 10*3/uL (ref 4.0–10.5)
nRBC: 0 % (ref 0.0–0.2)

## 2022-03-12 LAB — LIPASE, BLOOD: Lipase: 68 U/L — ABNORMAL HIGH (ref 11–51)

## 2022-03-12 MED ORDER — HEPARIN SODIUM (PORCINE) 5000 UNIT/ML IJ SOLN
5000.0000 [IU] | Freq: Three times a day (TID) | INTRAMUSCULAR | Status: DC
  Administered 2022-03-12 – 2022-03-13 (×2): 5000 [IU] via SUBCUTANEOUS
  Filled 2022-03-12 (×2): qty 1

## 2022-03-12 MED ORDER — AMLODIPINE BESYLATE 10 MG PO TABS
10.0000 mg | ORAL_TABLET | Freq: Every day | ORAL | Status: DC
  Administered 2022-03-13 – 2022-03-16 (×4): 10 mg via ORAL
  Filled 2022-03-12 (×4): qty 1

## 2022-03-12 MED ORDER — ACETAMINOPHEN 650 MG RE SUPP: 650.0000 mg | Freq: Four times a day (QID) | RECTAL | Status: DC | PRN

## 2022-03-12 MED ORDER — FENTANYL CITRATE PF 50 MCG/ML IJ SOSY
12.5000 ug | PREFILLED_SYRINGE | INTRAMUSCULAR | Status: DC | PRN
  Administered 2022-03-12 – 2022-03-13 (×3): 50 ug via INTRAVENOUS
  Filled 2022-03-12 (×4): qty 1

## 2022-03-12 MED ORDER — CLONIDINE HCL 0.1 MG PO TABS
0.2000 mg | ORAL_TABLET | Freq: Once | ORAL | Status: AC
  Administered 2022-03-12: 0.2 mg via ORAL
  Filled 2022-03-12: qty 2

## 2022-03-12 MED ORDER — SODIUM CHLORIDE 0.9 % IV BOLUS
1000.0000 mL | Freq: Once | INTRAVENOUS | Status: AC
Start: 2022-03-12 — End: 2022-03-12
  Administered 2022-03-12: 1000 mL via INTRAVENOUS

## 2022-03-12 MED ORDER — MELATONIN 3 MG PO TABS
3.0000 mg | ORAL_TABLET | Freq: Every day | ORAL | Status: DC
  Administered 2022-03-12 – 2022-03-15 (×4): 3 mg via ORAL
  Filled 2022-03-12 (×4): qty 1

## 2022-03-12 MED ORDER — ONDANSETRON HCL 4 MG/2ML IJ SOLN
4.0000 mg | Freq: Four times a day (QID) | INTRAMUSCULAR | Status: DC | PRN
  Administered 2022-03-12 – 2022-03-13 (×2): 4 mg via INTRAVENOUS
  Filled 2022-03-12 (×3): qty 2

## 2022-03-12 MED ORDER — FENTANYL CITRATE PF 50 MCG/ML IJ SOSY
50.0000 ug | PREFILLED_SYRINGE | Freq: Once | INTRAMUSCULAR | Status: AC
  Administered 2022-03-12: 50 ug via INTRAVENOUS
  Filled 2022-03-12: qty 1

## 2022-03-12 MED ORDER — ASPIRIN 81 MG PO TBEC
81.0000 mg | DELAYED_RELEASE_TABLET | Freq: Every day | ORAL | Status: DC
  Administered 2022-03-13 – 2022-03-16 (×4): 81 mg via ORAL
  Filled 2022-03-12 (×4): qty 1

## 2022-03-12 MED ORDER — METOPROLOL TARTRATE 5 MG/5ML IV SOLN: 5.0000 mg | Freq: Four times a day (QID) | INTRAVENOUS | Status: DC | PRN

## 2022-03-12 MED ORDER — ONDANSETRON 8 MG PO TBDP
8.0000 mg | ORAL_TABLET | Freq: Once | ORAL | Status: AC
  Administered 2022-03-12: 8 mg via ORAL
  Filled 2022-03-12: qty 1

## 2022-03-12 MED ORDER — AMLODIPINE BESYLATE 5 MG PO TABS
10.0000 mg | ORAL_TABLET | Freq: Once | ORAL | Status: AC
  Administered 2022-03-12: 10 mg via ORAL
  Filled 2022-03-12: qty 2

## 2022-03-12 MED ORDER — ACETAMINOPHEN 325 MG PO TABS
650.0000 mg | ORAL_TABLET | Freq: Four times a day (QID) | ORAL | Status: DC | PRN
  Administered 2022-03-13: 650 mg via ORAL
  Filled 2022-03-12: qty 2

## 2022-03-12 MED ORDER — CLONIDINE HCL 0.2 MG PO TABS
0.2000 mg | ORAL_TABLET | Freq: Three times a day (TID) | ORAL | Status: DC
  Administered 2022-03-12 – 2022-03-16 (×11): 0.2 mg via ORAL
  Filled 2022-03-12 (×11): qty 1

## 2022-03-12 MED ORDER — SODIUM CHLORIDE 0.9 % IV SOLN: INTRAVENOUS | Status: DC

## 2022-03-12 MED ORDER — ONDANSETRON HCL 4 MG PO TABS: 4.0000 mg | ORAL_TABLET | Freq: Four times a day (QID) | ORAL | Status: DC | PRN

## 2022-03-12 MED ORDER — SODIUM CHLORIDE 0.9% FLUSH
3.0000 mL | Freq: Two times a day (BID) | INTRAVENOUS | Status: DC
Start: 2022-03-12 — End: 2022-03-16
  Administered 2022-03-12 – 2022-03-16 (×8): 3 mL via INTRAVENOUS

## 2022-03-12 MED ORDER — CARVEDILOL 25 MG PO TABS
25.0000 mg | ORAL_TABLET | Freq: Two times a day (BID) | ORAL | Status: DC
  Administered 2022-03-13 (×2): 25 mg via ORAL
  Filled 2022-03-12 (×3): qty 1

## 2022-03-12 NOTE — ED Provider Triage Note (Signed)
Emergency Medicine Provider Triage Evaluation Note  Tamara Friedman , a 71 y.o. female  was evaluated in triage.  Pt complains of right lower quadrant abdominal pain.  Patient states pain is 10 out of 10 and began yesterday morning.  She states it has been worsening since onset.  She also complains of nausea and vomiting.  She denies urinary symptoms, diarrhea, constipation, chest pain, shortness of breath  Review of Systems  Positive: Abdominal pain, nausea, vomiting Negative: Chest Pain, shortness of breath  Physical Exam  BP (!) 183/126 (BP Location: Left Arm)   Pulse (!) 108   Temp 98.1 F (36.7 C) (Oral)   Resp 18   Ht '5\' 6"'$  (1.676 m)   Wt 72.6 kg   SpO2 100%   BMI 25.82 kg/m  Gen:   Awake, no distress   Resp:  Normal effort  MSK:   Moves extremities without difficulty  Other:    Medical Decision Making  Medically screening exam initiated at 10:55 AM.  Appropriate orders placed.  Tamara Friedman was informed that the remainder of the evaluation will be completed by another provider, this initial triage assessment does not replace that evaluation, and the importance of remaining in the ED until their evaluation is complete.     Dorothyann Peng, PA-C 03/12/22 1057

## 2022-03-12 NOTE — H&P (Signed)
History and Physical    Tamara Friedman YQI:[id number] DOB: [birth year] DOA: 03/12/2022  PCP: Lucianne Lei, MD  Patient coming from: Home  Chief Complaint: Vomiting  HPI: Tamara Friedman is a 71 y.o. female with medical history significant of HTN and HLD who presented to the emergency department on 8/12 with chief complaint of nausea and vomiting and abdominal pain.  She states that her nausea, vomiting and abdominal pain started suddenly yesterday morning.  She does not know any reason for sudden symptoms.  She lives at home alone and has not been around anybody with similar symptoms.  She describes her abdominal pain as lower pelvic pain, sharp in nature.  Has not been able to keep anything down including her blood pressure medication.  Denies any fevers or chills, does have some intermittent coughing.  No chest pain.  Denies any dysuria.  She also admits to some hematuria intermittently, no vaginal bleeding.  Had a normal bowel movement the other day.  ED Course: Patient's blood pressure was found to be elevated as high as 215/114.  She continued to have episodes of nausea, vomiting and abdominal pain.  Labs significant for creatinine 2.24, lipase 68.  CT abdomen pelvis without acute findings.  Due to patient's continued symptoms as well as elevated blood pressure, patient was referred for hospital observation.  Review of Systems: As per HPI. Otherwise, all other review of systems reviewed and are negative.   Past Medical History:  Diagnosis Date   High cholesterol    Hypertension     Past Surgical History:  Procedure Laterality Date   PARTIAL HYSTERECTOMY       reports that she has never smoked. She has never used smokeless tobacco. She reports that she does not drink alcohol and does not use drugs.  No Known Allergies  No family history on file.  Prior to Admission medications   Medication Sig Start Date End Date Taking? Authorizing Provider  amLODipine (NORVASC) 10 MG tablet Take  1 tablet (10 mg total) by mouth daily. 12/30/21   Geradine Girt, DO  aspirin 81 MG tablet Take 81 mg by mouth daily.     [provider]  atorvastatin (LIPITOR) 10 MG tablet Take 10 mg by mouth daily.    [provider]  carvedilol (COREG) 25 MG tablet Take 25 mg by mouth 2 (two) times daily with a meal.    [provider]  cloNIDine (CATAPRES) 0.2 MG tablet Take 0.2 mg by mouth 3 (three) times daily. 11/18/21   [provider]  magnesium hydroxide (MILK OF MAGNESIA) 400 MG/5ML suspension Take 30 mLs by mouth daily as needed for mild constipation. 12/29/21   Geradine Girt, DO  Multiple Vitamins-Minerals (MULTIVITAMIN ADULTS) TABS Take 1 tablet by mouth daily.    [provider]  ondansetron (ZOFRAN-ODT) 8 MG disintegrating tablet Take 1 tablet (8 mg total) by mouth every 8 (eight) hours as needed for nausea or vomiting. 11/30/21   Kathie Dike, MD  polyethylene glycol (MIRALAX / GLYCOLAX) 17 g packet Take 17 g by mouth daily. 12/30/21   Geradine Girt, DO    Physical Exam: Vitals:   03/12/22 1400 03/12/22 1520 03/12/22 1530 03/12/22 1615  BP: (!) 211/130 (!) 193/115 (!) 191/121 (!) 215/114  Pulse: 100 91 94 80  Resp: '20 18 17 18  '$ Temp:  98.1 F (36.7 C)    TempSrc:  Oral    SpO2: 100% 100% 100% 100%  Weight:  Height:        Constitutional: NAD, calm, comfortable Eyes: PERRL, lids and conjunctivae normal ENMT: Mucous membranes are moist. Normal dentition.  Respiratory: Clear to auscultation bilaterally, no wheezing, no crackles. Normal respiratory effort. No accessory muscle use. No conversational dyspnea  Cardiovascular: Regular rate and rhythm, no murmurs. No extremity edema.  Abdomen: Soft, nondistended, TTP pelvic   Musculoskeletal: No joint deformity upper and lower extremities. No contractures. Normal muscle tone.  Skin: no rashes, lesions, ulcers on exposed skin  Neurologic: Alert and oriented, speech fluent. No focal deficits.    Psychiatric: Normal judgment and insight. Normal mood and affect   Labs on Admission: I have personally reviewed following labs and imaging studies  CBC: Recent Labs  Lab 03/12/22 1140  WBC 10.2  HGB 14.2  HCT 46.1*  MCV 82.9  PLT 127*   Basic Metabolic Panel: Recent Labs  Lab 03/12/22 1140  NA 143  K 4.6  CL 112*  CO2 19*  GLUCOSE 165*  BUN 37*  CREATININE 2.24*  CALCIUM 11.9*   GFR: Estimated Creatinine Clearance: 22.8 mL/min (A) (by C-G formula based on SCr of 2.24 mg/dL (H)). Liver Function Tests: Recent Labs  Lab 03/12/22 1140  AST 22  ALT 18  ALKPHOS 106  BILITOT 0.6  PROT 9.2*  ALBUMIN 4.4   Recent Labs  Lab 03/12/22 1140  LIPASE 68*   No results for input(s): "AMMONIA" in the last 168 hours. Coagulation Profile: No results for input(s): "INR", "PROTIME" in the last 168 hours. Cardiac Enzymes: No results for input(s): "CKTOTAL", "CKMB", "CKMBINDEX", "TROPONINI" in the last 168 hours. BNP (last 3 results) No results for input(s): "PROBNP" in the last 8760 hours. HbA1C: No results for input(s): "HGBA1C" in the last 72 hours. CBG: No results for input(s): "GLUCAP" in the last 168 hours. Lipid Profile: No results for input(s): "CHOL", "HDL", "LDLCALC", "TRIG", "CHOLHDL", "LDLDIRECT" in the last 72 hours. Thyroid Function Tests: No results for input(s): "TSH", "T4TOTAL", "FREET4", "T3FREE", "THYROIDAB" in the last 72 hours. Anemia Panel: No results for input(s): "VITAMINB12", "FOLATE", "FERRITIN", "TIBC", "IRON", "RETICCTPCT" in the last 72 hours. Urine analysis:    Component Value Date/Time   COLORURINE YELLOW 12/25/2021 0212   APPEARANCEUR HAZY (A) 12/25/2021 0212   LABSPEC 1.014 12/25/2021 0212   PHURINE 6.0 12/25/2021 0212   GLUCOSEU NEGATIVE 12/25/2021 0212   HGBUR NEGATIVE 12/25/2021 0212   BILIRUBINUR NEGATIVE 12/25/2021 0212   KETONESUR 5 (A) 12/25/2021 0212   PROTEINUR >=300 (A) 12/25/2021 0212   UROBILINOGEN 0.2 07/19/2013 2044    NITRITE NEGATIVE 12/25/2021 0212   LEUKOCYTESUR NEGATIVE 12/25/2021 0212   Sepsis Labs: !!!!!!!!!!!!!!!!!!!!!!!!!!!!!!!!!!!!!!!!!!!! '@LABRCNTIP'$ (procalcitonin:4,lacticidven:4) )No results found for this or any previous visit (from the past 240 hour(s)).   Radiological Exams on Admission: CT ABDOMEN PELVIS WO CONTRAST  Result Date: 03/12/2022 CLINICAL DATA:  Nausea and vomiting.  Acute abdominal pain. EXAM: CT ABDOMEN AND PELVIS WITHOUT CONTRAST TECHNIQUE: Multidetector CT imaging of the abdomen and pelvis was performed following the standard protocol without IV contrast. RADIATION DOSE REDUCTION: This exam was performed according to the departmental dose-optimization program which includes automated exposure control, adjustment of the mA and/or kV according to patient size and/or use of iterative reconstruction technique. COMPARISON:  Dec 25, 2021 FINDINGS: Lower chest: There is a moderate hiatal hernia with approximately half of the stomach above the diaphragm. No suspicious infiltrates. Mild dependent subsegmental atelectasis. Hepatobiliary: The liver is unremarkable. The gallbladder is mildly distended without obvious stones or pericholecystic fluid/stranding. Evaluation is  somewhat limited due to motion. Pancreas: Unremarkable. No pancreatic ductal dilatation or surrounding inflammatory changes. Spleen: Normal in size without focal abnormality. Adrenals/Urinary Tract: Adrenal glands are normal. The right kidney is normal with no stones or masses. No right hydronephrosis. The right ureter is normal. The interpolar mass off the lateral left kidney demonstrates an attenuation of 31 Hounsfield units was previously evaluated with ultrasound demonstrating a cyst. A small rounded region of high attenuation in the upper pole of the right kidney on series 2, image 24 has been stable since June of 2022 and was not well assessed on the contrast enhanced images from July 19, 2013. The left kidney is  otherwise normal. The left ureter is normal. The bladder is unremarkable. Stomach/Bowel: Other than the moderate hiatal hernia, the stomach is normal. The small bowel is normal. Colonic diverticulosis is identified without diverticulitis. Mild fecal loading in the cecum and ascending colon. Visualized portions of the appendix are normal. Vascular/Lymphatic: Calcified atherosclerotic changes are identified in the nonaneurysmal aorta and iliac vessels. No adenopathy. Reproductive: Status post hysterectomy. No adnexal masses. Other: No free air free fluid. There is an umbilical hernia which contains a short segment of small bowel without wall thickening or evidence of obstruction. Musculoskeletal: No acute or significant osseous findings. IMPRESSION: 1. No definite cause for acute symptoms identified. 2. The gallbladder is mildly distended but otherwise unremarkable. Evaluation of the gallbladder is somewhat limited due to motion. A right upper quadrant ultrasound could better evaluate the gallbladder if clinically warranted. 3. Moderate hiatal hernia. 4. A small rounded region of high attenuation measuring 7 mm in the upper pole of the left kidney on series 2, image 24 has been stable since June of 2022 and is probably a hyperdense cyst. Recommend ultrasound as an outpatient for further characterization. 5. Colonic diverticulosis without diverticulitis. Mild fecal loading in the ascending colon and cecum. 6. Calcified atherosclerotic changes in the abdominal aorta and iliac vessels. 7. There is an umbilical hernia which contains a short loop of small bowel without evidence of obstruction or inflammation. Electronically Signed   By: Dorise Bullion III M.D.   On: 03/12/2022 15:05     Assessment/Plan Principal Problem:   Hypertensive emergency Active Problems:   Acute renal failure superimposed on stage 4 chronic kidney disease (HCC)   HTN (hypertension)   Intractable nausea and vomiting   HLD  (hyperlipidemia)   AKI (acute kidney injury) ([place])   Abdominal pain   Hypertensive emergency with AKI  -PTA meds include Norvasc, Coreg, Catapres -Resume meds. BP improved in ED   Nausea, vomiting, suprapubic abdominal pain -CT abdomen pelvis overall unremarkable for acute findings  -She does have chronically elevated lipase  -Supportive care, clear liquid diet, antiemetics and pain medication -Check UA due to suprapubic pain and intermittent hematuria   Hyperlipidemia -Hold Lipitor until able to tolerate p.o.  AKI on CKD stage IV -Baseline creatinine 1.7 -IV fluid  Left renal mass, likely to be a hyperdense cyst -Follow-up with ultrasound as outpatient for further characterization   DVT prophylaxis: Subcutaneous heparin Code Status: DNR, confirmed on admission  Family Communication: None at bedside  Disposition Plan: Discharge home once symptoms improve  Consults called: None    Status is: Observation The patient remains OBS appropriate and will d/c before 2 midnights.   Severity of Illness: The appropriate patient status for this patient is OBSERVATION. Observation status is judged to be reasonable and necessary in order to provide the required intensity of service to  ensure the patient's safety. The patient's presenting symptoms, physical exam findings, and initial radiographic and laboratory data in the context of their medical condition is felt to place them at decreased risk for further clinical deterioration. Furthermore, it is anticipated that the patient will be medically stable for discharge from the hospital within 2 midnights of admission.   Dessa Phi, DO [facility] 03/12/2022, 4:58 PM   Available via Epic secure chat 7am-7pm After these hours, please refer to coverage provider listed on [email or web address]

## 2022-03-12 NOTE — ED Triage Notes (Signed)
Patient reports emesis since yesterday, unable to hold down food or water. Denies diarrhea or fevers. Reports 10/10 abdominal pain. Denies dysuria.

## 2022-03-12 NOTE — ED Provider Notes (Signed)
Patient care taken over at shift handoff from Alecia Lemming, [place]  In short, 71 year old female patient with history of chronic kidney disease, hypertension, nausea vomiting, with no known previous abdominal surgeries, presents to emergency department for evaluation of abdominal pain rated 10 out of 10 in severity, nausea, and vomiting starting yesterday.  Patient with previous history of admission for similar complaints Physical Exam  BP (!) 211/130   Pulse 100   Temp 98.1 F (36.7 C) (Oral)   Resp 20   Ht '5\' 6"'$  (1.676 m)   Wt 72.6 kg   SpO2 100%   BMI 25.82 kg/m   Physical Exam Constitutional:      Appearance: She is ill-appearing.  HENT:     Head: Normocephalic and atraumatic.     Mouth/Throat:     Mouth: Mucous membranes are moist.  Neurological:     Mental Status: She is alert and oriented to person, place, and time.  Psychiatric:        Behavior: Behavior normal.     Procedures  Procedures  ED Course / MDM    Medical Decision Making Amount and/or Complexity of Data Reviewed Labs: ordered. Radiology: ordered.  Risk Prescription drug management. Decision regarding hospitalization.   Attempted p.o. challenge with the patient.  Patient still has nausea and vomiting.  Patient's blood pressure is still elevated after home medications were administered.  The patient's etiology of her abdominal pain is unclear and she appears to have hypertensive urgency.  I feel that admission would be good for this patient for observation and hydration.  I discussed the case with Dr. Maylene Roes, hospitalist, who agreed to admit the patient.        Ronny Bacon 03/12/22 1653    Varney Biles, MD 03/13/22 1325

## 2022-03-12 NOTE — ED Provider Notes (Signed)
[facility] DEPT Provider Note   CSN: [id number] Arrival date & time: 03/12/22  1035     History  Chief Complaint  Patient presents with   Emesis    Tamara Friedman is a 71 y.o. female.  Patient with history of chronic kidney disease, hypertension, nausea and vomiting, denies previous abdominal surgeries -- presents to the emergency department today for evaluation of abdominal pain, nausea and vomiting starting yesterday.  Patient states that she has been unable to keep anything down.  No chest pain, shortness of breath.  Denies constipation or diarrhea.  No dysuria, increased frequency or urgency, hematuria.        Home Medications Prior to Admission medications   Medication Sig Start Date End Date Taking? Authorizing Provider  amLODipine (NORVASC) 10 MG tablet Take 1 tablet (10 mg total) by mouth daily. 12/30/21   Geradine Girt, DO  aspirin 81 MG tablet Take 81 mg by mouth daily.     [provider]  atorvastatin (LIPITOR) 10 MG tablet Take 10 mg by mouth daily.    [provider]  carvedilol (COREG) 25 MG tablet Take 25 mg by mouth 2 (two) times daily with a meal.    [provider]  cloNIDine (CATAPRES) 0.2 MG tablet Take 0.2 mg by mouth 3 (three) times daily. 11/18/21   [provider]  magnesium hydroxide (MILK OF MAGNESIA) 400 MG/5ML suspension Take 30 mLs by mouth daily as needed for mild constipation. 12/29/21   Geradine Girt, DO  Multiple Vitamins-Minerals (MULTIVITAMIN ADULTS) TABS Take 1 tablet by mouth daily.    [provider]  ondansetron (ZOFRAN-ODT) 8 MG disintegrating tablet Take 1 tablet (8 mg total) by mouth every 8 (eight) hours as needed for nausea or vomiting. 11/30/21   Kathie Dike, MD  polyethylene glycol (MIRALAX / GLYCOLAX) 17 g packet Take 17 g by mouth daily. 12/30/21   Geradine Girt, DO      Allergies    Patient has no known allergies.    Review of Systems   Review of  Systems  Physical Exam Updated Vital Signs BP (!) 183/126 (BP Location: Left Arm)   Pulse (!) 108   Temp 98.1 F (36.7 C) (Oral)   Resp 18   Ht '5\' 6"'$  (1.676 m)   Wt 72.6 kg   SpO2 100%   BMI 25.82 kg/m   Physical Exam Vitals and nursing note reviewed.  Constitutional:      General: She is not in acute distress.    Appearance: She is well-developed.     Comments: No distress, extremely soft-spoken.  HENT:     Head: Normocephalic and atraumatic.     Right Ear: External ear normal.     Left Ear: External ear normal.     Nose: Nose normal.  Eyes:     Conjunctiva/sclera: Conjunctivae normal.  Cardiovascular:     Rate and Rhythm: Normal rate and regular rhythm.     Heart sounds: No murmur heard. Pulmonary:     Effort: No respiratory distress.     Breath sounds: No wheezing, rhonchi or rales.  Abdominal:     Palpations: Abdomen is soft.     Tenderness: There is abdominal tenderness. There is no guarding or rebound.     Comments: Epigastric tenderness without rebound or guarding  Musculoskeletal:     Cervical back: Normal range of motion and neck supple.     Right lower leg: No edema.  Left lower leg: No edema.  Skin:    General: Skin is warm and dry.     Findings: No rash.  Neurological:     General: No focal deficit present.     Mental Status: She is alert. Mental status is at baseline.     Motor: No weakness.  Psychiatric:        Mood and Affect: Mood normal. Affect is flat.     ED Results / Procedures / Treatments   Labs (all labs ordered are listed, but only abnormal results are displayed) Labs Reviewed  LIPASE, BLOOD - Abnormal; Notable for the following components:      Result Value   Lipase 68 (*)    All other components within normal limits  COMPREHENSIVE METABOLIC PANEL - Abnormal; Notable for the following components:   Chloride 112 (*)    CO2 19 (*)    Glucose, Bld 165 (*)    BUN 37 (*)    Creatinine, Ser 2.24 (*)    Calcium 11.9 (*)     Total Protein 9.2 (*)    GFR, Estimated 23 (*)    All other components within normal limits  CBC - Abnormal; Notable for the following components:   RBC 5.56 (*)    HCT 46.1 (*)    MCH 25.5 (*)    Platelets 438 (*)    All other components within normal limits  URINALYSIS, ROUTINE W REFLEX MICROSCOPIC    EKG None  Radiology CT ABDOMEN PELVIS WO CONTRAST  Result Date: 03/12/2022 CLINICAL DATA:  Nausea and vomiting.  Acute abdominal pain. EXAM: CT ABDOMEN AND PELVIS WITHOUT CONTRAST TECHNIQUE: Multidetector CT imaging of the abdomen and pelvis was performed following the standard protocol without IV contrast. RADIATION DOSE REDUCTION: This exam was performed according to the departmental dose-optimization program which includes automated exposure control, adjustment of the mA and/or kV according to patient size and/or use of iterative reconstruction technique. COMPARISON:  Dec 25, 2021 FINDINGS: Lower chest: There is a moderate hiatal hernia with approximately half of the stomach above the diaphragm. No suspicious infiltrates. Mild dependent subsegmental atelectasis. Hepatobiliary: The liver is unremarkable. The gallbladder is mildly distended without obvious stones or pericholecystic fluid/stranding. Evaluation is somewhat limited due to motion. Pancreas: Unremarkable. No pancreatic ductal dilatation or surrounding inflammatory changes. Spleen: Normal in size without focal abnormality. Adrenals/Urinary Tract: Adrenal glands are normal. The right kidney is normal with no stones or masses. No right hydronephrosis. The right ureter is normal. The interpolar mass off the lateral left kidney demonstrates an attenuation of 31 Hounsfield units was previously evaluated with ultrasound demonstrating a cyst. A small rounded region of high attenuation in the upper pole of the right kidney on series 2, image 24 has been stable since June of 2022 and was not well assessed on the contrast enhanced images from  July 19, 2013. The left kidney is otherwise normal. The left ureter is normal. The bladder is unremarkable. Stomach/Bowel: Other than the moderate hiatal hernia, the stomach is normal. The small bowel is normal. Colonic diverticulosis is identified without diverticulitis. Mild fecal loading in the cecum and ascending colon. Visualized portions of the appendix are normal. Vascular/Lymphatic: Calcified atherosclerotic changes are identified in the nonaneurysmal aorta and iliac vessels. No adenopathy. Reproductive: Status post hysterectomy. No adnexal masses. Other: No free air free fluid. There is an umbilical hernia which contains a short segment of small bowel without wall thickening or evidence of obstruction. Musculoskeletal: No acute or significant osseous findings.  IMPRESSION: 1. No definite cause for acute symptoms identified. 2. The gallbladder is mildly distended but otherwise unremarkable. Evaluation of the gallbladder is somewhat limited due to motion. A right upper quadrant ultrasound could better evaluate the gallbladder if clinically warranted. 3. Moderate hiatal hernia. 4. A small rounded region of high attenuation measuring 7 mm in the upper pole of the left kidney on series 2, image 24 has been stable since June of 2022 and is probably a hyperdense cyst. Recommend ultrasound as an outpatient for further characterization. 5. Colonic diverticulosis without diverticulitis. Mild fecal loading in the ascending colon and cecum. 6. Calcified atherosclerotic changes in the abdominal aorta and iliac vessels. 7. There is an umbilical hernia which contains a short loop of small bowel without evidence of obstruction or inflammation. Electronically Signed   By: Dorise Bullion III M.D.   On: 03/12/2022 15:05    Procedures Procedures    Medications Ordered in ED Medications  cloNIDine (CATAPRES) tablet 0.2 mg (has no administration in time range)  ondansetron (ZOFRAN-ODT) disintegrating tablet 8 mg (8  mg Oral Given 03/12/22 1126)  fentaNYL (SUBLIMAZE) injection 50 mcg (50 mcg Intravenous Given 03/12/22 1231)  sodium chloride 0.9 % bolus 1,000 mL (1,000 mLs Intravenous New Bag/Given 03/12/22 1230)    ED Course/ Medical Decision Making/ A&P    Patient seen and examined. History obtained directly from patient.   Labs/EKG: Ordered CBC, CMP, lipase, UA.  Imaging: Ordered CT abdomen pelvis pending.  Medications/Fluids: Fentanyl, Zofran, IV fluid bolus  Most recent vital signs reviewed and are as follows: BP (!) 183/126 (BP Location: Left Arm)   Pulse (!) 108   Temp 98.1 F (36.7 C) (Oral)   Resp 18   Ht '5\' 6"'$  (1.676 m)   Wt 72.6 kg   SpO2 100%   BMI 25.82 kg/m   Initial impression: Abdominal pain, nausea and vomiting  3:24 PM Reassessment performed. Patient appears stable.  Labs personally reviewed and interpreted including: CBC with white blood cell count 10.2, hemoglobin 14.2 otherwise unremarkable; CMP with glucose 165, creatinine elevated from baseline of 1.8-2.0 to 2.24; mild hypercalcemia at 11.9, normal liver function testing; lipase minimally elevated to 68.  UA pending.  Imaging personally visualized and interpreted including: CT abdomen pelvis without contrast, agree multiple chronic findings without signs of obstruction or acute infection.  Reviewed pertinent lab work and imaging with patient at bedside. Questions answered.  She is willing to attempt oral fluid challenge.  Blood pressure also higher now, dose of clonidine 0.2 mg ordered.  Most current vital signs reviewed and are as follows: BP (!) 211/130   Pulse 100   Temp 98.1 F (36.7 C) (Oral)   Resp 20   Ht '5\' 6"'$  (1.676 m)   Wt 72.6 kg   SpO2 100%   BMI 25.82 kg/m   Plan: P.o. challenge, reassess.  Signed out to [facility] at shift change.  Patient will need reassessed after oral fluid challenge.  If she tolerates well, consider discharge with symptomatic control.  If she continues to have  uncontrolled vomiting may need admission for hydration.                           Medical Decision Making Amount and/or Complexity of Data Reviewed Labs: ordered. Radiology: ordered.  Risk Prescription drug management.   For this patient's complaint of abdominal pain, the following conditions were considered on the differential diagnosis: gastritis/PUD, enteritis/duodenitis, appendicitis, cholelithiasis/cholecystitis, cholangitis, pancreatitis,  ruptured viscus, colitis, diverticulitis, small/large bowel obstruction, proctitis, cystitis, pyelonephritis, ureteral colic, aortic dissection, aortic aneurysm. In women, ectopic pregnancy, pelvic inflammatory disease, ovarian cysts, and tubo-ovarian abscess were also considered. Atypical chest etiologies were also considered including ACS, PE, and pneumonia.          Final Clinical Impression(s) / ED Diagnoses Final diagnoses:  None    Rx / DC Orders ED Discharge Orders     None         Carlisle Cater, PA-C 03/12/22 1528    Cristie Hem, MD 03/12/22 [phone]

## 2022-03-13 ENCOUNTER — Encounter (HOSPITAL_COMMUNITY): Payer: Self-pay | Admitting: Internal Medicine

## 2022-03-13 DIAGNOSIS — E875 Hyperkalemia: Secondary | ICD-10-CM | POA: Diagnosis not present

## 2022-03-13 DIAGNOSIS — K5900 Constipation, unspecified: Secondary | ICD-10-CM | POA: Diagnosis present

## 2022-03-13 DIAGNOSIS — B9620 Unspecified Escherichia coli [E. coli] as the cause of diseases classified elsewhere: Secondary | ICD-10-CM | POA: Diagnosis present

## 2022-03-13 DIAGNOSIS — N184 Chronic kidney disease, stage 4 (severe): Secondary | ICD-10-CM | POA: Diagnosis present

## 2022-03-13 DIAGNOSIS — N179 Acute kidney failure, unspecified: Secondary | ICD-10-CM | POA: Diagnosis present

## 2022-03-13 DIAGNOSIS — Z79899 Other long term (current) drug therapy: Secondary | ICD-10-CM | POA: Diagnosis not present

## 2022-03-13 DIAGNOSIS — R1084 Generalized abdominal pain: Secondary | ICD-10-CM | POA: Diagnosis present

## 2022-03-13 DIAGNOSIS — Z90711 Acquired absence of uterus with remaining cervical stump: Secondary | ICD-10-CM | POA: Diagnosis not present

## 2022-03-13 DIAGNOSIS — N2889 Other specified disorders of kidney and ureter: Secondary | ICD-10-CM | POA: Diagnosis present

## 2022-03-13 DIAGNOSIS — Z7982 Long term (current) use of aspirin: Secondary | ICD-10-CM | POA: Diagnosis not present

## 2022-03-13 DIAGNOSIS — R319 Hematuria, unspecified: Secondary | ICD-10-CM | POA: Diagnosis present

## 2022-03-13 DIAGNOSIS — N390 Urinary tract infection, site not specified: Secondary | ICD-10-CM | POA: Diagnosis present

## 2022-03-13 DIAGNOSIS — I129 Hypertensive chronic kidney disease with stage 1 through stage 4 chronic kidney disease, or unspecified chronic kidney disease: Secondary | ICD-10-CM | POA: Diagnosis present

## 2022-03-13 DIAGNOSIS — E7800 Pure hypercholesterolemia, unspecified: Secondary | ICD-10-CM | POA: Diagnosis present

## 2022-03-13 DIAGNOSIS — K921 Melena: Secondary | ICD-10-CM | POA: Diagnosis not present

## 2022-03-13 DIAGNOSIS — I10 Essential (primary) hypertension: Secondary | ICD-10-CM | POA: Diagnosis not present

## 2022-03-13 DIAGNOSIS — Z66 Do not resuscitate: Secondary | ICD-10-CM | POA: Diagnosis present

## 2022-03-13 DIAGNOSIS — I161 Hypertensive emergency: Secondary | ICD-10-CM | POA: Diagnosis present

## 2022-03-13 LAB — COMPREHENSIVE METABOLIC PANEL
ALT: 13 U/L (ref 0–44)
AST: 21 U/L (ref 15–41)
Albumin: 3.2 g/dL — ABNORMAL LOW (ref 3.5–5.0)
Alkaline Phosphatase: 64 U/L (ref 38–126)
Anion gap: 7 (ref 5–15)
BUN: 47 mg/dL — ABNORMAL HIGH (ref 8–23)
CO2: 20 mmol/L — ABNORMAL LOW (ref 22–32)
Calcium: 9.2 mg/dL (ref 8.9–10.3)
Chloride: 116 mmol/L — ABNORMAL HIGH (ref 98–111)
Creatinine, Ser: 2.7 mg/dL — ABNORMAL HIGH (ref 0.44–1.00)
GFR, Estimated: 18 mL/min — ABNORMAL LOW (ref >60)
Glucose, Bld: 92 mg/dL (ref 70–99)
Potassium: 4.4 mmol/L (ref 3.5–5.1)
Sodium: 143 mmol/L (ref 135–145)
Total Bilirubin: 0.7 mg/dL (ref 0.3–1.2)
Total Protein: 6.5 g/dL (ref 6.5–8.1)

## 2022-03-13 LAB — CBC
HCT: 35.3 % — ABNORMAL LOW (ref 36.0–46.0)
Hemoglobin: 10.8 g/dL — ABNORMAL LOW (ref 12.0–15.0)
MCH: 26.3 pg (ref 26.0–34.0)
MCHC: 30.6 g/dL (ref 30.0–36.0)
MCV: 85.9 fL (ref 80.0–100.0)
Platelets: 306 10*3/uL (ref 150–400)
RBC: 4.11 MIL/uL (ref 3.87–5.11)
RDW: 15.4 % (ref 11.5–15.5)
WBC: 9.7 10*3/uL (ref 4.0–10.5)
nRBC: 0 % (ref 0.0–0.2)

## 2022-03-13 MED ORDER — OXYCODONE HCL 5 MG PO TABS
5.0000 mg | ORAL_TABLET | ORAL | Status: DC | PRN
  Administered 2022-03-13 – 2022-03-16 (×2): 5 mg via ORAL
  Filled 2022-03-13 (×3): qty 1

## 2022-03-13 MED ORDER — SODIUM CHLORIDE 0.9 % IV SOLN
1.0000 g | INTRAVENOUS | Status: DC
  Administered 2022-03-13 – 2022-03-16 (×4): 1 g via INTRAVENOUS
  Filled 2022-03-13 (×5): qty 10

## 2022-03-13 MED ORDER — SENNOSIDES-DOCUSATE SODIUM 8.6-50 MG PO TABS
1.0000 | ORAL_TABLET | Freq: Every evening | ORAL | Status: DC | PRN
  Administered 2022-03-13 – 2022-03-16 (×3): 1 via ORAL
  Filled 2022-03-13 (×3): qty 1

## 2022-03-13 MED ORDER — HYDROMORPHONE HCL 1 MG/ML IJ SOLN
0.5000 mg | INTRAMUSCULAR | Status: DC | PRN
  Administered 2022-03-13 – 2022-03-15 (×7): 0.5 mg via INTRAVENOUS
  Filled 2022-03-13 (×7): qty 0.5

## 2022-03-13 MED ORDER — POLYETHYLENE GLYCOL 3350 17 G PO PACK
17.0000 g | PACK | Freq: Every day | ORAL | Status: DC
Start: 2022-03-13 — End: 2022-03-16
  Administered 2022-03-14 – 2022-03-16 (×3): 17 g via ORAL
  Filled 2022-03-13 (×3): qty 1

## 2022-03-13 NOTE — Progress Notes (Signed)
PROGRESS NOTE    Tamara Friedman  BOF:[id number] DOB: 10-21-1950 DOA: 03/12/2022 PCP: Lucianne Lei, MD     Brief Narrative:  Tamara Friedman is a 71 y.o. female with medical history significant of HTN and HLD who presented to the emergency department on 8/12 with chief complaint of nausea and vomiting and abdominal pain.  She states that her nausea, vomiting and abdominal pain started suddenly yesterday morning.  She does not know any reason for sudden symptoms.  She lives at home alone and has not been around anybody with similar symptoms.  She describes her abdominal pain as lower pelvic pain, sharp in nature.  Has not been able to keep anything down including her blood pressure medication.  Denies any fevers or chills, does have some intermittent coughing.  No chest pain.  Denies any dysuria.  She also admits to some hematuria intermittently, no vaginal bleeding.  Had a normal bowel movement the other day.  Patient's blood pressure was found to be elevated as high as 215/114.  She continued to have episodes of nausea, vomiting and abdominal pain.  Labs significant for creatinine 2.24, lipase 68.  CT abdomen pelvis without acute findings.  Due to patient's continued symptoms as well as elevated blood pressure, patient was referred for hospital observation.  New events last 24 hours / Subjective: Today she has no suprapubic pain but admits to bilateral side/flank pain.  Blood pressure is much better.  Has been tolerating clear liquid diet without any issues  Assessment & Plan:   Principal Problem:   Hypertensive emergency Active Problems:   Acute renal failure superimposed on stage 4 chronic kidney disease (HCC)   HTN (hypertension)   Intractable nausea and vomiting   HLD (hyperlipidemia)   AKI (acute kidney injury) ([place])   Abdominal pain   Hypertensive emergency with AKI  -PTA meds include Norvasc, Coreg, Catapres -Blood pressure improved to 138/87 this morning   Nausea, vomiting,  suprapubic abdominal pain -CT abdomen pelvis overall unremarkable for acute findings  -She does have chronically elevated lipase  -Supportive care, antiemetics and pain medication -Advance to full liquid diet today   Hyperlipidemia -Hold Lipitor until able to tolerate p.o.   AKI on CKD stage IV -Baseline creatinine 1.7 -IV fluid  Bacteriuria -Rocephin -Urine culture is pending   Hematuria Left renal mass, likely to be a hyperdense cyst -Follow-up with ultrasound as outpatient for further characterization -Would recommend follow-up with urology outpatient   DVT prophylaxis:  Place and maintain sequential compression device Start: 03/13/22 0940  Code Status: DNR Family Communication: No family at bedside Disposition Plan:  Status is: Inpatient Remains inpatient appropriate because: IVF and IV antibiotics, IV pain medication    Antimicrobials:  Anti-infectives (From admission, onward)    Start     Dose/Rate Route Frequency Ordered Stop   03/13/22 1100  cefTRIAXone (ROCEPHIN) 1 g in sodium chloride 0.9 % 100 mL IVPB        1 g 200 mL/hr over 30 Minutes Intravenous Every 24 hours 03/13/22 0940          Objective: Vitals:   03/12/22 1837 03/12/22 1950 03/12/22 2239 03/13/22 0233  BP: (!) 148/96 (!) 137/92 118/83 138/87  Pulse: 90 72 71 (!) 54  Resp: '20 18 18 18  '$ Temp: 97.8 F (36.6 C)  97.8 F (36.6 C) 97.6 F (36.4 C)  TempSrc: Oral   Oral  SpO2: 99% 98% 98% 99%  Weight:      Height:  Intake/Output Summary (Last 24 hours) at 03/13/2022 1351 Last data filed at 03/13/2022 0445 Gross per 24 hour  Intake 1001.16 ml  Output --  Net 1001.16 ml   Filed Weights   03/12/22 1048  Weight: 72.6 kg    Examination:  General exam: Appears calm and comfortable  Respiratory system: Clear to auscultation. Respiratory effort normal. No respiratory distress. No conversational dyspnea.  Cardiovascular system: S1 & S2 heard, RRR. No murmurs. No pedal  edema. Gastrointestinal system: Abdomen is nondistended, soft and nontender. Normal bowel sounds heard. Central nervous system: Alert and oriented. No focal neurological deficits. Speech clear.  Extremities: Symmetric in appearance  Skin: No rashes, lesions or ulcers on exposed skin  Psychiatry: Judgement and insight appear normal. Mood & affect appropriate.   Data Reviewed: I have personally reviewed following labs and imaging studies  CBC: Recent Labs  Lab 03/12/22 1140 03/13/22 0414  WBC 10.2 9.7  HGB 14.2 10.8*  HCT 46.1* 35.3*  MCV 82.9 85.9  PLT 438* 867   Basic Metabolic Panel: Recent Labs  Lab 03/12/22 1140 03/13/22 0414  NA 143 143  K 4.6 4.4  CL 112* 116*  CO2 19* 20*  GLUCOSE 165* 92  BUN 37* 47*  CREATININE 2.24* 2.70*  CALCIUM 11.9* 9.2   GFR: Estimated Creatinine Clearance: 18.9 mL/min (A) (by C-G formula based on SCr of 2.7 mg/dL (H)). Liver Function Tests: Recent Labs  Lab 03/12/22 1140 03/13/22 0414  AST 22 21  ALT 18 13  ALKPHOS 106 64  BILITOT 0.6 0.7  PROT 9.2* 6.5  ALBUMIN 4.4 3.2*   Recent Labs  Lab 03/12/22 1140  LIPASE 68*   No results for input(s): "AMMONIA" in the last 168 hours. Coagulation Profile: No results for input(s): "INR", "PROTIME" in the last 168 hours. Cardiac Enzymes: No results for input(s): "CKTOTAL", "CKMB", "CKMBINDEX", "TROPONINI" in the last 168 hours. BNP (last 3 results) No results for input(s): "PROBNP" in the last 8760 hours. HbA1C: No results for input(s): "HGBA1C" in the last 72 hours. CBG: No results for input(s): "GLUCAP" in the last 168 hours. Lipid Profile: No results for input(s): "CHOL", "HDL", "LDLCALC", "TRIG", "CHOLHDL", "LDLDIRECT" in the last 72 hours. Thyroid Function Tests: No results for input(s): "TSH", "T4TOTAL", "FREET4", "T3FREE", "THYROIDAB" in the last 72 hours. Anemia Panel: No results for input(s): "VITAMINB12", "FOLATE", "FERRITIN", "TIBC", "IRON", "RETICCTPCT" in the last 72  hours. Sepsis Labs: No results for input(s): "PROCALCITON", "LATICACIDVEN" in the last 168 hours.  No results found for this or any previous visit (from the past 240 hour(s)).    Radiology Studies: CT ABDOMEN PELVIS WO CONTRAST  Result Date: 03/12/2022 CLINICAL DATA:  Nausea and vomiting.  Acute abdominal pain. EXAM: CT ABDOMEN AND PELVIS WITHOUT CONTRAST TECHNIQUE: Multidetector CT imaging of the abdomen and pelvis was performed following the standard protocol without IV contrast. RADIATION DOSE REDUCTION: This exam was performed according to the departmental dose-optimization program which includes automated exposure control, adjustment of the mA and/or kV according to patient size and/or use of iterative reconstruction technique. COMPARISON:  Dec 25, 2021 FINDINGS: Lower chest: There is a moderate hiatal hernia with approximately half of the stomach above the diaphragm. No suspicious infiltrates. Mild dependent subsegmental atelectasis. Hepatobiliary: The liver is unremarkable. The gallbladder is mildly distended without obvious stones or pericholecystic fluid/stranding. Evaluation is somewhat limited due to motion. Pancreas: Unremarkable. No pancreatic ductal dilatation or surrounding inflammatory changes. Spleen: Normal in size without focal abnormality. Adrenals/Urinary Tract: Adrenal glands are normal. The  right kidney is normal with no stones or masses. No right hydronephrosis. The right ureter is normal. The interpolar mass off the lateral left kidney demonstrates an attenuation of 31 Hounsfield units was previously evaluated with ultrasound demonstrating a cyst. A small rounded region of high attenuation in the upper pole of the right kidney on series 2, image 24 has been stable since June of 2022 and was not well assessed on the contrast enhanced images from July 19, 2013. The left kidney is otherwise normal. The left ureter is normal. The bladder is unremarkable. Stomach/Bowel: Other than  the moderate hiatal hernia, the stomach is normal. The small bowel is normal. Colonic diverticulosis is identified without diverticulitis. Mild fecal loading in the cecum and ascending colon. Visualized portions of the appendix are normal. Vascular/Lymphatic: Calcified atherosclerotic changes are identified in the nonaneurysmal aorta and iliac vessels. No adenopathy. Reproductive: Status post hysterectomy. No adnexal masses. Other: No free air free fluid. There is an umbilical hernia which contains a short segment of small bowel without wall thickening or evidence of obstruction. Musculoskeletal: No acute or significant osseous findings. IMPRESSION: 1. No definite cause for acute symptoms identified. 2. The gallbladder is mildly distended but otherwise unremarkable. Evaluation of the gallbladder is somewhat limited due to motion. A right upper quadrant ultrasound could better evaluate the gallbladder if clinically warranted. 3. Moderate hiatal hernia. 4. A small rounded region of high attenuation measuring 7 mm in the upper pole of the left kidney on series 2, image 24 has been stable since June of 2022 and is probably a hyperdense cyst. Recommend ultrasound as an outpatient for further characterization. 5. Colonic diverticulosis without diverticulitis. Mild fecal loading in the ascending colon and cecum. 6. Calcified atherosclerotic changes in the abdominal aorta and iliac vessels. 7. There is an umbilical hernia which contains a short loop of small bowel without evidence of obstruction or inflammation. Electronically Signed   By: Dorise Bullion III M.D.   On: 03/12/2022 15:05      Scheduled Meds:  amLODipine  10 mg Oral Daily   aspirin EC  81 mg Oral Daily   carvedilol  25 mg Oral BID WC   cloNIDine  0.2 mg Oral TID   melatonin  3 mg Oral QHS   sodium chloride flush  3 mL Intravenous Q12H   Continuous Infusions:  sodium chloride 100 mL/hr at 03/13/22 0415   cefTRIAXone (ROCEPHIN)  IV 1 g (03/13/22  1309)     LOS: 0 days     Dessa Phi, DO [facility] 03/13/2022, 1:51 PM   Available via Epic secure chat 7am-7pm After these hours, please refer to coverage provider listed on [email or web address]

## 2022-03-14 DIAGNOSIS — I161 Hypertensive emergency: Secondary | ICD-10-CM | POA: Diagnosis not present

## 2022-03-14 LAB — BASIC METABOLIC PANEL
Anion gap: 4 — ABNORMAL LOW (ref 5–15)
BUN: 41 mg/dL — ABNORMAL HIGH (ref 8–23)
CO2: 21 mmol/L — ABNORMAL LOW (ref 22–32)
Calcium: 8.2 mg/dL — ABNORMAL LOW (ref 8.9–10.3)
Chloride: 111 mmol/L (ref 98–111)
Creatinine, Ser: 2.31 mg/dL — ABNORMAL HIGH (ref 0.44–1.00)
GFR, Estimated: 22 mL/min — ABNORMAL LOW (ref >60)
Glucose, Bld: 93 mg/dL (ref 70–99)
Potassium: 4.7 mmol/L (ref 3.5–5.1)
Sodium: 136 mmol/L (ref 135–145)

## 2022-03-14 LAB — CBC
HCT: 32 % — ABNORMAL LOW (ref 36.0–46.0)
Hemoglobin: 9.6 g/dL — ABNORMAL LOW (ref 12.0–15.0)
MCH: 26.3 pg (ref 26.0–34.0)
MCHC: 30 g/dL (ref 30.0–36.0)
MCV: 87.7 fL (ref 80.0–100.0)
Platelets: 235 10*3/uL (ref 150–400)
RBC: 3.65 MIL/uL — ABNORMAL LOW (ref 3.87–5.11)
RDW: 15.1 % (ref 11.5–15.5)
WBC: 5.8 10*3/uL (ref 4.0–10.5)
nRBC: 0 % (ref 0.0–0.2)

## 2022-03-14 MED ORDER — ATORVASTATIN CALCIUM 10 MG PO TABS
10.0000 mg | ORAL_TABLET | Freq: Every day | ORAL | Status: DC
  Administered 2022-03-14 – 2022-03-16 (×3): 10 mg via ORAL
  Filled 2022-03-14 (×3): qty 1

## 2022-03-14 MED ORDER — CARVEDILOL 12.5 MG PO TABS
12.5000 mg | ORAL_TABLET | Freq: Two times a day (BID) | ORAL | Status: DC
  Administered 2022-03-14 – 2022-03-16 (×4): 12.5 mg via ORAL
  Filled 2022-03-14 (×4): qty 1

## 2022-03-14 NOTE — Progress Notes (Signed)
  Transition of Care [facility]) Screening Note   Patient Details  Name: Tamara Friedman Date of Birth: [birth year]   Transition of Care [facility]) CM/SW Contact:    Roseanne Kaufman, RN Phone Number: 03/14/2022, 1:49 PM    Transition of Care Department [facility]) has reviewed patient and no TOC needs have been identified at this time. We will continue to monitor patient advancement through interdisciplinary progression rounds. If new patient transition needs arise, please place a TOC consult.

## 2022-03-14 NOTE — Progress Notes (Signed)
PROGRESS NOTE    Tamara Friedman  KGM:[id number] DOB: November 15, 1950 DOA: 03/12/2022 PCP: Lucianne Lei, MD     Brief Narrative:  Tamara Friedman is a 71 y.o. female with medical history significant of HTN and HLD who presented to the emergency department on 8/12 with chief complaint of nausea and vomiting and abdominal pain.  She states that her nausea, vomiting and abdominal pain started suddenly yesterday morning.  She does not know any reason for sudden symptoms.  She lives at home alone and has not been around anybody with similar symptoms.  She describes her abdominal pain as lower pelvic pain, sharp in nature.  Has not been able to keep anything down including her blood pressure medication.  Denies any fevers or chills, does have some intermittent coughing.  No chest pain.  Denies any dysuria.  She also admits to some hematuria intermittently, no vaginal bleeding.  Had a normal bowel movement the other day.  Patient's blood pressure was found to be elevated as high as 215/114.  She continued to have episodes of nausea, vomiting and abdominal pain.  Labs significant for creatinine 2.24, lipase 68.  CT abdomen pelvis without acute findings.  Due to patient's continued symptoms as well as elevated blood pressure, patient was referred for hospital observation.  New events last 24 hours / Subjective: Continues to have some suprapubic pain but overall improved since admission.  No vomiting.  Wants to advance diet today  Assessment & Plan:   Principal Problem:   Hypertensive emergency Active Problems:   Acute renal failure superimposed on stage 4 chronic kidney disease (HCC)   HTN (hypertension)   Intractable nausea and vomiting   HLD (hyperlipidemia)   AKI (acute kidney injury) ([place])   Abdominal pain   Hypertensive emergency with AKI  -PTA meds include Norvasc, Coreg, Catapres -Blood pressure improved to 130/69 this morning   Nausea, vomiting, suprapubic abdominal pain -CT abdomen pelvis  overall unremarkable for acute findings  -She does have chronically elevated lipase  -Supportive care, antiemetics and pain medication -Advance to soft diet today   Hyperlipidemia -Lipitor   AKI on CKD stage IV -Baseline creatinine 1.7 -Improving, continue IV fluid  E. coli UTI -Rocephin -Urine culture sensitivity pending   Hematuria Left renal mass, likely to be a hyperdense cyst -Follow-up with ultrasound as outpatient for further characterization -Would recommend follow-up with urology outpatient   DVT prophylaxis:  Place and maintain sequential compression device Start: 03/13/22 0940  Code Status: DNR Family Communication: No family at bedside Disposition Plan:  Status is: Inpatient Remains inpatient appropriate because: IVF and IV antibiotics  Antimicrobials:  Anti-infectives (From admission, onward)    Start     Dose/Rate Route Frequency Ordered Stop   03/13/22 1100  cefTRIAXone (ROCEPHIN) 1 g in sodium chloride 0.9 % 100 mL IVPB        1 g 200 mL/hr over 30 Minutes Intravenous Every 24 hours 03/13/22 0940          Objective: Vitals:   03/13/22 2300 03/14/22 0033 03/14/22 0504 03/14/22 0957  BP:   123/70 130/69  Pulse:   64   Resp: '13 14 17   '$ Temp:   97.6 F (36.4 C)   TempSrc:   Oral   SpO2:   100%   Weight:      Height:        Intake/Output Summary (Last 24 hours) at 03/14/2022 1311 Last data filed at 03/14/2022 0958 Gross per 24 hour  Intake  2423.66 ml  Output --  Net 2423.66 ml    Filed Weights   03/12/22 1048  Weight: 72.6 kg    Examination:  General exam: Appears calm and comfortable  Respiratory system: Clear to auscultation. Respiratory effort normal. No respiratory distress. No conversational dyspnea.  Cardiovascular system: S1 & S2 heard, RRR. No murmurs. No pedal edema. Gastrointestinal system: Abdomen is nondistended, soft and mildly TTP suprapubic  Central nervous system: Alert and oriented. No focal neurological deficits.  Speech clear.  Extremities: Symmetric in appearance  Skin: No rashes, lesions or ulcers on exposed skin  Psychiatry: Judgement and insight appear normal. Mood & affect appropriate.   Data Reviewed: I have personally reviewed following labs and imaging studies  CBC: Recent Labs  Lab 03/12/22 1140 03/13/22 0414 03/14/22 0335  WBC 10.2 9.7 5.8  HGB 14.2 10.8* 9.6*  HCT 46.1* 35.3* 32.0*  MCV 82.9 85.9 87.7  PLT 438* 306 867    Basic Metabolic Panel: Recent Labs  Lab 03/12/22 1140 03/13/22 0414 03/14/22 0335  NA 143 143 136  K 4.6 4.4 4.7  CL 112* 116* 111  CO2 19* 20* 21*  GLUCOSE 165* 92 93  BUN 37* 47* 41*  CREATININE 2.24* 2.70* 2.31*  CALCIUM 11.9* 9.2 8.2*    GFR: Estimated Creatinine Clearance: 22.1 mL/min (A) (by C-G formula based on SCr of 2.31 mg/dL (H)). Liver Function Tests: Recent Labs  Lab 03/12/22 1140 03/13/22 0414  AST 22 21  ALT 18 13  ALKPHOS 106 64  BILITOT 0.6 0.7  PROT 9.2* 6.5  ALBUMIN 4.4 3.2*    Recent Labs  Lab 03/12/22 1140  LIPASE 68*    No results for input(s): "AMMONIA" in the last 168 hours. Coagulation Profile: No results for input(s): "INR", "PROTIME" in the last 168 hours. Cardiac Enzymes: No results for input(s): "CKTOTAL", "CKMB", "CKMBINDEX", "TROPONINI" in the last 168 hours. BNP (last 3 results) No results for input(s): "PROBNP" in the last 8760 hours. HbA1C: No results for input(s): "HGBA1C" in the last 72 hours. CBG: No results for input(s): "GLUCAP" in the last 168 hours. Lipid Profile: No results for input(s): "CHOL", "HDL", "LDLCALC", "TRIG", "CHOLHDL", "LDLDIRECT" in the last 72 hours. Thyroid Function Tests: No results for input(s): "TSH", "T4TOTAL", "FREET4", "T3FREE", "THYROIDAB" in the last 72 hours. Anemia Panel: No results for input(s): "VITAMINB12", "FOLATE", "FERRITIN", "TIBC", "IRON", "RETICCTPCT" in the last 72 hours. Sepsis Labs: No results for input(s): "PROCALCITON", "LATICACIDVEN" in the  last 168 hours.  Recent Results (from the past 240 hour(s))  Urine Culture     Status: Abnormal (Preliminary result)   Collection Time: 03/12/22  8:34 PM   Specimen: Urine, Clean Catch  Result Value Ref Range Status   Specimen Description   Final    URINE, CLEAN CATCH Performed at [facility], [place] [address], [place], [place] [address]    Special Requests   Final    NONE Performed at [facility], [place] [address], [place], [place] [address]    Culture >=100,000 COLONIES/mL ESCHERICHIA COLI (A)  Final   Report Status PENDING  Incomplete      Radiology Studies: CT ABDOMEN PELVIS WO CONTRAST  Result Date: 03/12/2022 CLINICAL DATA:  Nausea and vomiting.  Acute abdominal pain. EXAM: CT ABDOMEN AND PELVIS WITHOUT CONTRAST TECHNIQUE: Multidetector CT imaging of the abdomen and pelvis was performed following the standard protocol without IV contrast. RADIATION DOSE REDUCTION: This exam was performed according to the departmental dose-optimization program which includes  automated exposure control, adjustment of the mA and/or kV according to patient size and/or use of iterative reconstruction technique. COMPARISON:  Dec 25, 2021 FINDINGS: Lower chest: There is a moderate hiatal hernia with approximately half of the stomach above the diaphragm. No suspicious infiltrates. Mild dependent subsegmental atelectasis. Hepatobiliary: The liver is unremarkable. The gallbladder is mildly distended without obvious stones or pericholecystic fluid/stranding. Evaluation is somewhat limited due to motion. Pancreas: Unremarkable. No pancreatic ductal dilatation or surrounding inflammatory changes. Spleen: Normal in size without focal abnormality. Adrenals/Urinary Tract: Adrenal glands are normal. The right kidney is normal with no stones or masses. No right hydronephrosis. The right ureter is normal. The interpolar mass off the lateral left kidney demonstrates an attenuation of 31  Hounsfield units was previously evaluated with ultrasound demonstrating a cyst. A small rounded region of high attenuation in the upper pole of the right kidney on series 2, image 24 has been stable since June of 2022 and was not well assessed on the contrast enhanced images from July 19, 2013. The left kidney is otherwise normal. The left ureter is normal. The bladder is unremarkable. Stomach/Bowel: Other than the moderate hiatal hernia, the stomach is normal. The small bowel is normal. Colonic diverticulosis is identified without diverticulitis. Mild fecal loading in the cecum and ascending colon. Visualized portions of the appendix are normal. Vascular/Lymphatic: Calcified atherosclerotic changes are identified in the nonaneurysmal aorta and iliac vessels. No adenopathy. Reproductive: Status post hysterectomy. No adnexal masses. Other: No free air free fluid. There is an umbilical hernia which contains a short segment of small bowel without wall thickening or evidence of obstruction. Musculoskeletal: No acute or significant osseous findings. IMPRESSION: 1. No definite cause for acute symptoms identified. 2. The gallbladder is mildly distended but otherwise unremarkable. Evaluation of the gallbladder is somewhat limited due to motion. A right upper quadrant ultrasound could better evaluate the gallbladder if clinically warranted. 3. Moderate hiatal hernia. 4. A small rounded region of high attenuation measuring 7 mm in the upper pole of the left kidney on series 2, image 24 has been stable since June of 2022 and is probably a hyperdense cyst. Recommend ultrasound as an outpatient for further characterization. 5. Colonic diverticulosis without diverticulitis. Mild fecal loading in the ascending colon and cecum. 6. Calcified atherosclerotic changes in the abdominal aorta and iliac vessels. 7. There is an umbilical hernia which contains a short loop of small bowel without evidence of obstruction or inflammation.  Electronically Signed   By: Dorise Bullion III M.D.   On: 03/12/2022 15:05      Scheduled Meds:  amLODipine  10 mg Oral Daily   aspirin EC  81 mg Oral Daily   atorvastatin  10 mg Oral Daily   carvedilol  12.5 mg Oral BID WC   cloNIDine  0.2 mg Oral TID   melatonin  3 mg Oral QHS   polyethylene glycol  17 g Oral Daily   sodium chloride flush  3 mL Intravenous Q12H   Continuous Infusions:  sodium chloride 100 mL/hr at 03/14/22 1239   cefTRIAXone (ROCEPHIN)  IV 1 g (03/14/22 1008)     LOS: 1 day     Dessa Phi, DO [facility] 03/14/2022, 1:11 PM   Available via Epic secure chat 7am-7pm After these hours, please refer to coverage provider listed on [email or web address]

## 2022-03-14 NOTE — Progress Notes (Signed)
Mobility Specialist - Progress Note     03/14/22 1514  Mobility  Activity Ambulated with assistance in hallway  Level of Assistance Contact guard assist, steadying assist  Assistive Device Front wheel walker  Distance Ambulated (ft) 100 ft  Activity Response Tolerated well  $Mobility charge 1 Mobility   Pt was found in bed and was agreeable to mobilize. Used bathroom before ambulating in hallway. C/o a headache and stated it had been on going before ambulation. Pt returned to bed with all necessities in reach and RN was notified of complaint.  Ferd Hibbs Mobility Specialist

## 2022-03-15 DIAGNOSIS — I161 Hypertensive emergency: Secondary | ICD-10-CM | POA: Diagnosis not present

## 2022-03-15 LAB — BASIC METABOLIC PANEL
Anion gap: 3 — ABNORMAL LOW (ref 5–15)
BUN: 30 mg/dL — ABNORMAL HIGH (ref 8–23)
CO2: 21 mmol/L — ABNORMAL LOW (ref 22–32)
Calcium: 8.6 mg/dL — ABNORMAL LOW (ref 8.9–10.3)
Chloride: 116 mmol/L — ABNORMAL HIGH (ref 98–111)
Creatinine, Ser: 1.7 mg/dL — ABNORMAL HIGH (ref 0.44–1.00)
GFR, Estimated: 31 mL/min — ABNORMAL LOW (ref >60)
Glucose, Bld: 85 mg/dL (ref 70–99)
Potassium: 5.3 mmol/L — ABNORMAL HIGH (ref 3.5–5.1)
Sodium: 140 mmol/L (ref 135–145)

## 2022-03-15 LAB — URINE CULTURE: Culture: >=100000 — AB

## 2022-03-15 MED ORDER — SODIUM ZIRCONIUM CYCLOSILICATE 10 G PO PACK
10.0000 g | PACK | Freq: Once | ORAL | Status: AC
  Administered 2022-03-15: 10 g via ORAL
  Filled 2022-03-15: qty 1

## 2022-03-15 MED ORDER — ALPRAZOLAM 0.5 MG PO TABS
0.5000 mg | ORAL_TABLET | Freq: Two times a day (BID) | ORAL | Status: DC | PRN
Start: 2022-03-15 — End: 2022-03-16
  Administered 2022-03-15: 0.5 mg via ORAL
  Filled 2022-03-15: qty 1

## 2022-03-15 NOTE — Progress Notes (Signed)
Mobility Specialist - Progress Note   03/15/22 1200  Mobility  Activity Ambulated with assistance in hallway  Level of Assistance Standby assist, set-up cues, supervision of patient - no hands on  Assistive Device Front wheel walker  Distance Ambulated (ft) 170 ft  Activity Response Tolerated well  $Mobility charge 1 Mobility   Pt received in chair and agreed to mobility. Some slight dizziness, Pt claimed it wasn't substantial. No pain. Pt left in chair with all necessities within reach.   Roderick Pee Mobility Specialist

## 2022-03-15 NOTE — Progress Notes (Addendum)
Ambulated patient to restroom, provided privacy but while standing outside of bathroom waiting on patient I noticed that her stream of urine starts, and stops frequently.   Based on current UTI, frequently interrupted urine stream, and complaints of pelvic pain in the past (per charting) will bladder scan patient after she uses the restroom to see if she is retaining urine.   PT bladder scanned twice after going to bathroom. Both scans were 0 per monitor but bladder did not appear to be well visualized. Will do a pre/post void scan next time.

## 2022-03-15 NOTE — Progress Notes (Signed)
Patient urinating frequently. She requested that her IV fluids be turned off. TRH NP notified, and her oral intake has been adequate.

## 2022-03-15 NOTE — Progress Notes (Signed)
PROGRESS NOTE    Tamara Friedman  FGH:[id number] DOB: 04-20-1951 DOA: 03/12/2022 PCP: Lucianne Lei, MD     Brief Narrative:  Tamara Friedman is a 71 y.o. female with medical history significant of HTN and HLD who presented to the emergency department on 8/12 with chief complaint of nausea and vomiting and abdominal pain.  She states that her nausea, vomiting and abdominal pain started suddenly yesterday morning.  She does not know any reason for sudden symptoms.  She lives at home alone and has not been around anybody with similar symptoms.  She describes her abdominal pain as lower pelvic pain, sharp in nature.  Has not been able to keep anything down including her blood pressure medication.  Denies any fevers or chills, does have some intermittent coughing.  No chest pain.  Denies any dysuria.  She also admits to some hematuria intermittently, no vaginal bleeding.  Had a normal bowel movement the other day.  Patient's blood pressure was found to be elevated as high as 215/114.  She continued to have episodes of nausea, vomiting and abdominal pain.  Labs significant for creatinine 2.24, lipase 68.  CT abdomen pelvis without acute findings.  Due to patient's continued symptoms as well as elevated blood pressure, patient was referred for hospital observation.  New events last 24 hours / Subjective: Admits to some lower extremity pain.  Has been tolerating diet, has some lower abdominal pain but no nausea or vomiting.  However, patient relates that she does not feel well enough to go home today.  After my evaluation, notified by RN that patient had a hard stool with blood on the toilet paper.   Assessment & Plan:   Principal Problem:   Hypertensive emergency Active Problems:   Acute renal failure superimposed on stage 4 chronic kidney disease (HCC)   HTN (hypertension)   Intractable nausea and vomiting   HLD (hyperlipidemia)   AKI (acute kidney injury) ([place])   Abdominal pain   Hypertensive  emergency with AKI  -PTA meds include Norvasc, Coreg, Catapres -Blood pressure improved   Nausea, vomiting, suprapubic abdominal pain -CT abdomen pelvis overall unremarkable for acute findings  -She does have chronically elevated lipase  -Supportive care, antiemetics and pain medication -Resolved and tolerating diet   Hyperlipidemia -Lipitor   AKI on CKD stage IV -Baseline creatinine 1.7 -Resolved.  Stop IV fluid  Pansensitive E. coli UTI -Rocephin, can discharge on Keflex   Hematuria Left renal mass, likely to be a hyperdense cyst -Follow-up with ultrasound as outpatient for further characterization -Would recommend follow-up with urology outpatient  Mild hyperkalemia -Lokelma     DVT prophylaxis:  Place and maintain sequential compression device Start: 03/13/22 0940  Code Status: DNR Family Communication: No family at bedside Disposition Plan:  Status is: Inpatient Remains inpatient appropriate because: pt still not feeling well   Antimicrobials:  Anti-infectives (From admission, onward)    Start     Dose/Rate Route Frequency Ordered Stop   03/13/22 1100  cefTRIAXone (ROCEPHIN) 1 g in sodium chloride 0.9 % 100 mL IVPB        1 g 200 mL/hr over 30 Minutes Intravenous Every 24 hours 03/13/22 0940          Objective: Vitals:   03/14/22 1511 03/14/22 1932 03/15/22 0525 03/15/22 1223  BP: (!) 105/58 98/66 (!) 152/81 138/82  Pulse:  63 (!) 54 67  Resp:  '16 18 19  '$ Temp:  98.1 F (36.7 C) 98.4 F (36.9 C) 98.1  F (36.7 C)  TempSrc:  Oral Oral Oral  SpO2:  98% 100% 100%  Weight:      Height:        Intake/Output Summary (Last 24 hours) at 03/15/2022 1352 Last data filed at 03/15/2022 0507 Gross per 24 hour  Intake 2378.08 ml  Output --  Net 2378.08 ml    Filed Weights   03/12/22 1048  Weight: 72.6 kg    Examination:  General exam: Appears calm and comfortable  Respiratory system: Clear to auscultation. Respiratory effort normal. No respiratory  distress. No conversational dyspnea.  Cardiovascular system: S1 & S2 heard, RRR. No murmurs. No pedal edema. Gastrointestinal system: Abdomen is nondistended, soft and mildly TTP suprapubic  Central nervous system: Alert and oriented. No focal neurological deficits. Speech clear.  Extremities: Symmetric in appearance  Skin: No rashes, lesions or ulcers on exposed skin  Psychiatry: Judgement and insight appear normal. Mood & affect appropriate.   Data Reviewed: I have personally reviewed following labs and imaging studies  CBC: Recent Labs  Lab 03/12/22 1140 03/13/22 0414 03/14/22 0335  WBC 10.2 9.7 5.8  HGB 14.2 10.8* 9.6*  HCT 46.1* 35.3* 32.0*  MCV 82.9 85.9 87.7  PLT 438* 306 626    Basic Metabolic Panel: Recent Labs  Lab 03/12/22 1140 03/13/22 0414 03/14/22 0335 03/15/22 0426  NA 143 143 136 140  K 4.6 4.4 4.7 5.3*  CL 112* 116* 111 116*  CO2 19* 20* 21* 21*  GLUCOSE 165* 92 93 85  BUN 37* 47* 41* 30*  CREATININE 2.24* 2.70* 2.31* 1.70*  CALCIUM 11.9* 9.2 8.2* 8.6*    GFR: Estimated Creatinine Clearance: 30.1 mL/min (A) (by C-G formula based on SCr of 1.7 mg/dL (H)). Liver Function Tests: Recent Labs  Lab 03/12/22 1140 03/13/22 0414  AST 22 21  ALT 18 13  ALKPHOS 106 64  BILITOT 0.6 0.7  PROT 9.2* 6.5  ALBUMIN 4.4 3.2*    Recent Labs  Lab 03/12/22 1140  LIPASE 68*    No results for input(s): "AMMONIA" in the last 168 hours. Coagulation Profile: No results for input(s): "INR", "PROTIME" in the last 168 hours. Cardiac Enzymes: No results for input(s): "CKTOTAL", "CKMB", "CKMBINDEX", "TROPONINI" in the last 168 hours. BNP (last 3 results) No results for input(s): "PROBNP" in the last 8760 hours. HbA1C: No results for input(s): "HGBA1C" in the last 72 hours. CBG: No results for input(s): "GLUCAP" in the last 168 hours. Lipid Profile: No results for input(s): "CHOL", "HDL", "LDLCALC", "TRIG", "CHOLHDL", "LDLDIRECT" in the last 72 hours. Thyroid  Function Tests: No results for input(s): "TSH", "T4TOTAL", "FREET4", "T3FREE", "THYROIDAB" in the last 72 hours. Anemia Panel: No results for input(s): "VITAMINB12", "FOLATE", "FERRITIN", "TIBC", "IRON", "RETICCTPCT" in the last 72 hours. Sepsis Labs: No results for input(s): "PROCALCITON", "LATICACIDVEN" in the last 168 hours.  Recent Results (from the past 240 hour(s))  Urine Culture     Status: Abnormal   Collection Time: 03/12/22  8:34 PM   Specimen: Urine, Clean Catch  Result Value Ref Range Status   Specimen Description   Final    URINE, CLEAN CATCH Performed at [facility], [place] [address] [place], [place] [address]    Special Requests   Final    NONE Performed at [facility], [place] [address], [place], [place] [address]    Culture >=100,000 COLONIES/mL ESCHERICHIA COLI (A)  Final   Report Status 03/15/2022 FINAL  Final   Organism ID, Bacteria ESCHERICHIA COLI (  A)  Final      Susceptibility   Escherichia coli - MIC*    AMPICILLIN <=2 SENSITIVE Sensitive     CEFAZOLIN <=4 SENSITIVE Sensitive     CEFEPIME <=0.12 SENSITIVE Sensitive     CEFTRIAXONE <=0.25 SENSITIVE Sensitive     CIPROFLOXACIN <=0.25 SENSITIVE Sensitive     GENTAMICIN <=1 SENSITIVE Sensitive     IMIPENEM <=0.25 SENSITIVE Sensitive     NITROFURANTOIN <=16 SENSITIVE Sensitive     TRIMETH/SULFA <=20 SENSITIVE Sensitive     AMPICILLIN/SULBACTAM <=2 SENSITIVE Sensitive     PIP/TAZO <=4 SENSITIVE Sensitive     * >=100,000 COLONIES/mL ESCHERICHIA COLI      Radiology Studies: No results found.    Scheduled Meds:  amLODipine  10 mg Oral Daily   aspirin EC  81 mg Oral Daily   atorvastatin  10 mg Oral Daily   carvedilol  12.5 mg Oral BID WC   cloNIDine  0.2 mg Oral TID   melatonin  3 mg Oral QHS   polyethylene glycol  17 g Oral Daily   sodium chloride flush  3 mL Intravenous Q12H   Continuous Infusions:  cefTRIAXone (ROCEPHIN)  IV 1 g (03/15/22 1240)     LOS: 2  days     Dessa Phi, DO [facility] 03/15/2022, 1:52 PM   Available via Epic secure chat 7am-7pm After these hours, please refer to coverage provider listed on [email or web address]

## 2022-03-15 NOTE — Progress Notes (Signed)
Patient reported having a large, hard stool in bathroom, but flushed before staff could examine it.  Reported some bright red blood on stool and on toilet paper when she wiped.  MD notified.  Angie Fava, RN

## 2022-03-16 DIAGNOSIS — I10 Essential (primary) hypertension: Secondary | ICD-10-CM

## 2022-03-16 LAB — BASIC METABOLIC PANEL
Anion gap: 5 (ref 5–15)
BUN: 26 mg/dL — ABNORMAL HIGH (ref 8–23)
CO2: 23 mmol/L (ref 22–32)
Calcium: 8.6 mg/dL — ABNORMAL LOW (ref 8.9–10.3)
Chloride: 112 mmol/L — ABNORMAL HIGH (ref 98–111)
Creatinine, Ser: 1.62 mg/dL — ABNORMAL HIGH (ref 0.44–1.00)
GFR, Estimated: 33 mL/min — ABNORMAL LOW (ref >60)
Glucose, Bld: 105 mg/dL — ABNORMAL HIGH (ref 70–99)
Potassium: 4.9 mmol/L (ref 3.5–5.1)
Sodium: 140 mmol/L (ref 135–145)

## 2022-03-16 LAB — CBC
HCT: 31.1 % — ABNORMAL LOW (ref 36.0–46.0)
Hemoglobin: 9.3 g/dL — ABNORMAL LOW (ref 12.0–15.0)
MCH: 26.3 pg (ref 26.0–34.0)
MCHC: 29.9 g/dL — ABNORMAL LOW (ref 30.0–36.0)
MCV: 87.9 fL (ref 80.0–100.0)
Platelets: 232 10*3/uL (ref 150–400)
RBC: 3.54 MIL/uL — ABNORMAL LOW (ref 3.87–5.11)
RDW: 15.2 % (ref 11.5–15.5)
WBC: 6.2 10*3/uL (ref 4.0–10.5)
nRBC: 0 % (ref 0.0–0.2)

## 2022-03-16 MED ORDER — POLYETHYLENE GLYCOL 3350 17 G PO PACK: 17.0000 g | PACK | Freq: Every day | ORAL | 0 refills | Status: AC

## 2022-03-16 MED ORDER — OXYCODONE HCL 5 MG PO TABS: 5.0000 mg | ORAL_TABLET | Freq: Four times a day (QID) | ORAL | 0 refills | Status: DC | PRN

## 2022-03-16 MED ORDER — CEPHALEXIN 500 MG PO CAPS: 500.0000 mg | ORAL_CAPSULE | Freq: Four times a day (QID) | ORAL | 0 refills | Status: AC

## 2022-03-16 NOTE — Discharge Summary (Signed)
[facility]  Physician Discharge Summary   Patient ID: Tamara Friedman MRN: [id number] DOB/AGE: [birth year] 73 y.o.  Admit date: 03/12/2022 Discharge date: 03/16/2022    PCP: Lucianne Lei, MD  DISCHARGE DIAGNOSES:  Principal Problem:   Hypertensive emergency   Acute renal failure superimposed on stage 4 chronic kidney disease (HCC)   HTN (hypertension)   Intractable nausea and vomiting   HLD (hyperlipidemia)   AKI (acute kidney injury) ([place])   Abdominal pain   RECOMMENDATIONS FOR OUTPATIENT FOLLOW UP: Consider referral to gastroenterology if patient has not had a colonoscopy within the last 5 to 10 years. Consider referral to urology for left renal mass or pursue renal ultrasound.   Home Health: PT Equipment/Devices: None  CODE STATUS: DNR  DISCHARGE CONDITION: fair  Diet recommendation: As before  INITIAL HISTORY: Tamara Friedman is a 71 y.o. female with medical history significant of HTN and HLD who presented to the emergency department on 8/12 with chief complaint of nausea and vomiting and abdominal pain.  She states that her nausea, vomiting and abdominal pain started suddenly yesterday morning.  She does not know any reason for sudden symptoms.  She lives at home alone and has not been around anybody with similar symptoms.  She describes her abdominal pain as lower pelvic pain, sharp in nature.  Has not been able to keep anything down including her blood pressure medication.  Denies any fevers or chills, does have some intermittent coughing.  No chest pain.  Denies any dysuria.  She also admits to some hematuria intermittently, no vaginal bleeding.  Had a normal bowel movement the other day.   Patient's blood pressure was found to be elevated as high as 215/114.  She continued to have episodes of nausea, vomiting and abdominal pain.  Labs significant for creatinine 2.24, lipase 68.  CT abdomen pelvis without acute findings.  Due to patient's continued symptoms as  well as elevated blood pressure, patient was referred for hospital observation.    HOSPITAL COURSE:   Hypertensive emergency with AKI  -PTA meds include Norvasc, Coreg, Catapres which were resumed -Blood pressure improved   Nausea, vomiting, suprapubic abdominal pain -CT abdomen pelvis overall unremarkable for acute findings  -She does have chronically elevated lipase   Constipation with mild hematochezia She was constipated.  She was given laxatives after which she had a bowel movement which had some blood in it.  Hemoglobin was checked and noted to be stable.  She was asked to discuss this further with her PCP and consider referral to gastroenterology if she has not had colonoscopy in the last 5 to 10 years.  Mild hematochezia was likely due to constipation and straining.    Hyperlipidemia -Lipitor   AKI on CKD stage IV -Baseline creatinine 1.7 -Resolved.     Pansensitive E. coli UTI -Rocephin, can discharge on Keflex   Hematuria Left renal mass, likely to be a hyperdense cyst -Follow-up with ultrasound as outpatient for further characterization -Would recommend follow-up with urology outpatient   Mild hyperkalemia Resolved with [facility]  Patient is stable.  Okay for discharge home today.   PERTINENT LABS:  The results of significant diagnostics from this hospitalization (including imaging, microbiology, ancillary and laboratory) are listed below for reference.    Microbiology: Recent Results (from the past 240 hour(s))  Urine Culture     Status: Abnormal   Collection Time: 03/12/22  8:34 PM   Specimen: Urine, Clean Catch  Result Value Ref Range Status   Specimen  Description   Final    URINE, CLEAN CATCH Performed at [facility], [place] [address] [place], [place] [address]    Special Requests   Final    NONE Performed at [facility], [place] [address] [place], [place] [address]    Culture >=100,000 COLONIES/mL ESCHERICHIA  COLI (A)  Final   Report Status 03/15/2022 FINAL  Final   Organism ID, Bacteria ESCHERICHIA COLI (A)  Final      Susceptibility   Escherichia coli - MIC*    AMPICILLIN <=2 SENSITIVE Sensitive     CEFAZOLIN <=4 SENSITIVE Sensitive     CEFEPIME <=0.12 SENSITIVE Sensitive     CEFTRIAXONE <=0.25 SENSITIVE Sensitive     CIPROFLOXACIN <=0.25 SENSITIVE Sensitive     GENTAMICIN <=1 SENSITIVE Sensitive     IMIPENEM <=0.25 SENSITIVE Sensitive     NITROFURANTOIN <=16 SENSITIVE Sensitive     TRIMETH/SULFA <=20 SENSITIVE Sensitive     AMPICILLIN/SULBACTAM <=2 SENSITIVE Sensitive     PIP/TAZO <=4 SENSITIVE Sensitive     * >=100,000 COLONIES/mL ESCHERICHIA COLI     Labs:   Basic Metabolic Panel: Recent Labs  Lab 03/12/22 1140 03/13/22 0414 03/14/22 0335 03/15/22 0426 03/16/22 0441  NA 143 143 136 140 140  K 4.6 4.4 4.7 5.3* 4.9  CL 112* 116* 111 116* 112*  CO2 19* 20* 21* 21* 23  GLUCOSE 165* 92 93 85 105*  BUN 37* 47* 41* 30* 26*  CREATININE 2.24* 2.70* 2.31* 1.70* 1.62*  CALCIUM 11.9* 9.2 8.2* 8.6* 8.6*   Liver Function Tests: Recent Labs  Lab 03/12/22 1140 03/13/22 0414  AST 22 21  ALT 18 13  ALKPHOS 106 64  BILITOT 0.6 0.7  PROT 9.2* 6.5  ALBUMIN 4.4 3.2*   Recent Labs  Lab 03/12/22 1140  LIPASE 68*    CBC: Recent Labs  Lab 03/12/22 1140 03/13/22 0414 03/14/22 0335 03/16/22 0951  WBC 10.2 9.7 5.8 6.2  HGB 14.2 10.8* 9.6* 9.3*  HCT 46.1* 35.3* 32.0* 31.1*  MCV 82.9 85.9 87.7 87.9  PLT 438* 306 235 232     IMAGING STUDIES CT ABDOMEN PELVIS WO CONTRAST  Result Date: 03/12/2022 CLINICAL DATA:  Nausea and vomiting.  Acute abdominal pain. EXAM: CT ABDOMEN AND PELVIS WITHOUT CONTRAST TECHNIQUE: Multidetector CT imaging of the abdomen and pelvis was performed following the standard protocol without IV contrast. RADIATION DOSE REDUCTION: This exam was performed according to the departmental dose-optimization program which includes automated exposure control,  adjustment of the mA and/or kV according to patient size and/or use of iterative reconstruction technique. COMPARISON:  Dec 25, 2021 FINDINGS: Lower chest: There is a moderate hiatal hernia with approximately half of the stomach above the diaphragm. No suspicious infiltrates. Mild dependent subsegmental atelectasis. Hepatobiliary: The liver is unremarkable. The gallbladder is mildly distended without obvious stones or pericholecystic fluid/stranding. Evaluation is somewhat limited due to motion. Pancreas: Unremarkable. No pancreatic ductal dilatation or surrounding inflammatory changes. Spleen: Normal in size without focal abnormality. Adrenals/Urinary Tract: Adrenal glands are normal. The right kidney is normal with no stones or masses. No right hydronephrosis. The right ureter is normal. The interpolar mass off the lateral left kidney demonstrates an attenuation of 31 Hounsfield units was previously evaluated with ultrasound demonstrating a cyst. A small rounded region of high attenuation in the upper pole of the right kidney on series 2, image 24 has been stable since June of 2022 and was not well assessed on the contrast enhanced images from  July 19, 2013. The left kidney is otherwise normal. The left ureter is normal. The bladder is unremarkable. Stomach/Bowel: Other than the moderate hiatal hernia, the stomach is normal. The small bowel is normal. Colonic diverticulosis is identified without diverticulitis. Mild fecal loading in the cecum and ascending colon. Visualized portions of the appendix are normal. Vascular/Lymphatic: Calcified atherosclerotic changes are identified in the nonaneurysmal aorta and iliac vessels. No adenopathy. Reproductive: Status post hysterectomy. No adnexal masses. Other: No free air free fluid. There is an umbilical hernia which contains a short segment of small bowel without wall thickening or evidence of obstruction. Musculoskeletal: No acute or significant osseous findings.  IMPRESSION: 1. No definite cause for acute symptoms identified. 2. The gallbladder is mildly distended but otherwise unremarkable. Evaluation of the gallbladder is somewhat limited due to motion. A right upper quadrant ultrasound could better evaluate the gallbladder if clinically warranted. 3. Moderate hiatal hernia. 4. A small rounded region of high attenuation measuring 7 mm in the upper pole of the left kidney on series 2, image 24 has been stable since June of 2022 and is probably a hyperdense cyst. Recommend ultrasound as an outpatient for further characterization. 5. Colonic diverticulosis without diverticulitis. Mild fecal loading in the ascending colon and cecum. 6. Calcified atherosclerotic changes in the abdominal aorta and iliac vessels. 7. There is an umbilical hernia which contains a short loop of small bowel without evidence of obstruction or inflammation. Electronically Signed   By: Dorise Bullion III M.D.   On: 03/12/2022 15:05    DISCHARGE EXAMINATION: Vitals:   03/15/22 0525 03/15/22 1223 03/15/22 1941 03/16/22 0556  BP: (!) 152/81 138/82 107/69 126/82  Pulse: (!) 54 67 72 65  Resp: '18 19 18 18  '$ Temp: 98.4 F (36.9 C) 98.1 F (36.7 C) 98.3 F (36.8 C) (!) 97.4 F (36.3 C)  TempSrc: Oral Oral Oral Oral  SpO2: 100% 100% 100% 100%  Weight:      Height:       General appearance: Awake alert.  In no distress Resp: Clear to auscultation bilaterally.  Normal effort Cardio: S1-S2 is normal regular.  No S3-S4.  No rubs murmurs or bruit GI: Abdomen is soft.  Nontender nondistended.  Bowel sounds are present normal.  No masses organomegaly Extremities: No edema.  Full range of motion of lower extremities. Neurologic: Alert and oriented x3.  No focal neurological deficits.    DISPOSITION: Home  Discharge Instructions     Call MD for:  difficulty breathing, headache or visual disturbances   Complete by: As directed    Call MD for:  extreme fatigue   Complete by: As directed     Call MD for:  persistant dizziness or light-headedness   Complete by: As directed    Call MD for:  persistant nausea and vomiting   Complete by: As directed    Call MD for:  severe uncontrolled pain   Complete by: As directed    Call MD for:  temperature >100.4   Complete by: As directed    Diet - low sodium heart healthy   Complete by: As directed    Discharge instructions   Complete by: As directed    Please take your medications as prescribed.  Please be sure to follow-up with your primary care provider within 1 week.  Seek attention if you have more blood per rectum.  Please ask your PCP to refer you to a gastroenterologist for colonoscopy if you have not had one within the  last 5 to 10 years.  You were cared for by a hospitalist during your hospital stay. If you have any questions about your discharge medications or the care you received while you were in the hospital after you are discharged, you can call the unit and asked to speak with the hospitalist on call if the hospitalist that took care of you is not available. Once you are discharged, your primary care physician will handle any further medical issues. Please note that NO REFILLS for any discharge medications will be authorized once you are discharged, as it is imperative that you return to your primary care physician (or establish a relationship with a primary care physician if you do not have one) for your aftercare needs so that they can reassess your need for medications and monitor your lab values. If you do not have a primary care physician, you can call [phone] for a physician referral.   Increase activity slowly   Complete by: As directed          Allergies as of 03/16/2022   No Known Allergies      Medication List     TAKE these medications    albuterol 108 (90 Base) MCG/ACT inhaler Commonly known as: VENTOLIN HFA Inhale 1-2 puffs into the lungs every 6 (six) hours as needed for wheezing or shortness of  breath.   amLODipine 10 MG tablet Commonly known as: NORVASC Take 1 tablet (10 mg total) by mouth daily.   aspirin 81 MG tablet Take 81 mg by mouth daily.   atorvastatin 10 MG tablet Commonly known as: LIPITOR Take 10 mg by mouth daily.   carvedilol 25 MG tablet Commonly known as: COREG Take 25 mg by mouth 2 (two) times daily with a meal.   cephALEXin 500 MG capsule Commonly known as: KEFLEX Take 1 capsule (500 mg total) by mouth 4 (four) times daily for 3 days.   cloNIDine 0.2 MG tablet Commonly known as: CATAPRES Take 0.2 mg by mouth 3 (three) times daily.   magnesium hydroxide 400 MG/5ML suspension Commonly known as: MILK OF MAGNESIA Take 30 mLs by mouth daily as needed for mild constipation.   MELATONIN PO Take 1 tablet by mouth at bedtime.   Multivitamin Adults Tabs Take 1 tablet by mouth daily after supper.   ondansetron 8 MG disintegrating tablet Commonly known as: ZOFRAN-ODT Take 1 tablet (8 mg total) by mouth every 8 (eight) hours as needed for nausea or vomiting.   oxyCODONE 5 MG immediate release tablet Commonly known as: Oxy IR/ROXICODONE Take 1 tablet (5 mg total) by mouth every 6 (six) hours as needed for severe pain.   polyethylene glycol 17 g packet Commonly known as: MIRALAX / GLYCOLAX Take 17 g by mouth daily.          Follow-up Information     Lucianne Lei, MD Follow up.   Specialty: Family Medicine Why: post hospitalization follow up Contact information: [address] [place] [address] [phone]         [facility] Follow up.   Why: renal mass Contact information: [place], [facility] Follow up.   Specialty: [place] Why: A representative from [place] will contact you within 24-48 hours after your discharge. Contact information: [place] [address] [phone]  TOTAL DISCHARGE TIME: 35 minutes  [employer]riel[employer] on [email or web address]  03/17/2022, 12:54 PM

## 2022-03-16 NOTE — TOC Transition Note (Signed)
Transition of Care [facility]) - CM/SW Discharge Note   Patient Details  Name: Tamara Friedman MRN: [id number] Date of Birth: [birth year]  Transition of [facility]) CM/SW Contact:  Roseanne Kaufman, RN Phone Number: 03/16/2022, 2:28 PM   Clinical Narrative:   Kathreen Cornfield, BSN, RN, [place], NCM [phone] RNCM spoke with pt regarding discharge planning for [facility]. Offered pt medicare.gov list of home health agencies to choose from.  Pt chose [facility] to render services. [facility] [place] notified. Patient made aware that Alvis Lemmings will be in contact in 24-48 hours.  No DME needs identified at this time.   No further TOC needs at this time.    Final next level of care: Home/Self Care Barriers to Discharge: No Barriers Identified   Patient Goals and CMS Choice Patient states their goals for this hospitalization and ongoing recovery are:: return home with [facility] services   Choice offered to / list presented to : Patient  Discharge Placement                       Discharge Plan and Services   Discharge Planning Services: CM Consult Post Acute Care Choice: Home Health                    HH Arranged: PT [place]: [place] Date [place]: 03/16/22   Representative spoke with at [place]: [place] ([place]) Interventions     Readmission Risk Interventions     No data to display

## 2022-03-16 NOTE — Progress Notes (Signed)
Discharge instructions provided to patient, reviewed using teach back method.  Patient demonstrated understanding.  PIV and cardiac monitoring removed.  Patient transported to main entrance with belongings for ride home with grandson.  Angie Fava, RN

## 2022-03-16 NOTE — Evaluation (Signed)
Physical Therapy Evaluation Patient Details Name: Tamara Friedman MRN: [id number] DOB: 1951/05/03 Today's Date: 03/16/2022  History of Present Illness  Ms. Fillion is a 71 yo female with who presented to the [facility] ED 8/12 with N/V.  After developing nausea and vomiting she then had some abdominal pain complaints, BP in ED 215/114. Admitted 12/27/2021 with similar complaints and symptoms.   PMH: HLD, HTN, CKD4,   Clinical Impression  Patient evaluated by Physical Therapy with no further acute PT needs identified. All education has been completed and the patient has no further questions. Pt demonstrated modified independence with bed mobility and transfers using RW, supervision for ambulation 313f in hallway with RW, no overt LOB noted. Educated pt on walking program and progression with slow build up of activity tolerance. See below for any follow-up Physical Therapy or equipment needs. PT is signing off. Thank you for this referral.        Recommendations for follow up therapy are one component of a multi-disciplinary discharge planning process, led by the attending physician.  Recommendations may be updated based on patient status, additional functional criteria and insurance authorization.  Follow Up Recommendations Home health PT      Assistance Recommended at Discharge PRN  Patient can return home with the following  A little help with walking and/or transfers;A little help with bathing/dressing/bathroom;Assistance with cooking/housework;Assist for transportation;Help with stairs or ramp for entrance    Equipment Recommendations Other (comment);BSC/3in1 (Lobbyist Pt willing to pay out of pocket if not covered.)  Recommendations for Other Services       Functional Status Assessment Patient has not had a recent decline in their functional status     Precautions / Restrictions Precautions Precautions: Fall Restrictions Weight Bearing Restrictions: No      Mobility  Bed  Mobility Overal bed mobility: Modified Independent             General bed mobility comments: increased time    Transfers Overall transfer level: Modified independent Equipment used: Rolling walker (2 wheels)               General transfer comment: increased time    Ambulation/Gait Ambulation/Gait assistance: Supervision Gait Distance (Feet): 350 Feet Assistive device: Rolling walker (2 wheels) Gait Pattern/deviations: Step-through pattern, Decreased dorsiflexion - right, Decreased dorsiflexion - left Gait velocity: decreased     General Gait Details: Pt ambulated with RW and supervision (guidance of IV pole, otherwise would rate Mod I) 3559f no physical assist required or overt LOB noted. Pt with trunk flexed, B/L feet highly externally rotated, and required standing rest breaks after ~30066f Stairs            Wheelchair Mobility    Modified Rankin (Stroke Patients Only)       Balance Overall balance assessment: Mild deficits observed, not formally tested                                           Pertinent Vitals/Pain Pain Assessment Pain Assessment: No/denies pain    Home Living Family/patient expects to be discharged to:: Private residence Living Arrangements: Alone Available Help at Discharge: Family;Available PRN/intermittently Type of Home: Apartment Home Access: Stairs to enter Entrance Stairs-Rails: None Entrance Stairs-Number of Steps: 1   Home Layout: One level Home Equipment: RolConservation officer, nature wheels);Cane - single point      Prior Function Prior Level  of Function : Independent/Modified Independent             Mobility Comments: does not drive, grandson assists with groceries and transportation, reports using RW ADLs Comments: IADLs     Hand Dominance   Dominant Hand: Right    Extremity/Trunk Assessment   Upper Extremity Assessment Upper Extremity Assessment: Overall WFL for tasks  assessed;Generalized weakness    Lower Extremity Assessment Lower Extremity Assessment: Overall WFL for tasks assessed;Generalized weakness;RLE deficits/detail;LLE deficits/detail RLE Deficits / Details: MMT grossly 3+/5, pt's foot is held in high degree of ER, 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 RLE Sensation: WNL LLE Deficits / Details: MMT grossly 3+/5, pt's foot is held in high degree of ER, 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 LLE Sensation: WNL    Cervical / Trunk Assessment Cervical / Trunk Assessment: Kyphotic  Communication   Communication: No difficulties  Cognition Arousal/Alertness: Awake/alert Behavior During Therapy: WFL for tasks assessed/performed Overall Cognitive Status: Within Functional Limits for tasks assessed                                          General Comments      Exercises Other Exercises Other Exercises: Progressive walking program education   Assessment/Plan    PT Assessment All further PT needs can be met in the next venue of care  PT Problem List Decreased strength;Decreased range of motion;Decreased activity tolerance;Decreased balance;Decreased mobility;Decreased coordination       PT Treatment Interventions      PT Goals (Current goals can be found in the Care Plan section)  Acute Rehab PT Goals Patient Stated Goal: walk more PT Goal Formulation: All assessment and education complete, DC therapy Potential to Achieve Goals: Good    Frequency       Co-evaluation               AM-PAC PT "6 Clicks" Mobility  Outcome Measure Help needed turning from your back to your side while in a flat bed without using bedrails?: None Help needed moving from lying on your back to sitting on the side of a flat bed without using bedrails?: None Help needed moving to and from a bed to a chair (including a wheelchair)?: A Little Help needed standing up from a chair using your arms (e.g., wheelchair or bedside chair)?: A Little Help needed to walk in hospital  room?: A Little Help needed climbing 3-5 steps with a railing? : A Little 6 Click Score: 20    End of Session Equipment Utilized During Treatment: Gait belt Activity Tolerance: Patient tolerated treatment well;No increased pain Patient left: in bed;with call bell/phone within reach Nurse Communication: Mobility status PT Visit Diagnosis: Difficulty in walking, not elsewhere classified (R26.2);Muscle weakness (generalized) (M62.81)    Time: 5790-3833 PT Time Calculation (min) (ACUTE ONLY): 25 min   Charges:   PT Evaluation $PT Eval Low Complexity: 1 Low          Coolidge Breeze, PT, DPT [facility] Department Office: [phone] Pager: [phone]  Coolidge Breeze 03/16/2022, 12:54 PM

## 2022-03-16 NOTE — Progress Notes (Signed)
Mobility Specialist - Progress Note    03/16/22 1200  Mobility  Activity Ambulated with assistance in hallway  Level of Assistance Standby assist, set-up cues, supervision of patient - no hands on  Assistive Device Front wheel walker  Distance Ambulated (ft) 700 ft  Activity Response Tolerated well  $Mobility charge 1 Mobility   Pt was found in recliner chair and was agreeable to mobilize. Stated feeling a little lightheaded but got better while ambulating. Took 3 rest breaks towards the end due to fatigue. Returned to bed with all necessities in reach.  Ferd Hibbs Mobility Specialist

## 2022-03-25 DIAGNOSIS — I129 Hypertensive chronic kidney disease with stage 1 through stage 4 chronic kidney disease, or unspecified chronic kidney disease: Secondary | ICD-10-CM | POA: Diagnosis not present

## 2022-03-25 DIAGNOSIS — N2889 Other specified disorders of kidney and ureter: Secondary | ICD-10-CM | POA: Diagnosis not present

## 2022-03-31 DIAGNOSIS — I10 Essential (primary) hypertension: Secondary | ICD-10-CM | POA: Diagnosis not present

## 2022-03-31 DIAGNOSIS — E785 Hyperlipidemia, unspecified: Secondary | ICD-10-CM | POA: Diagnosis not present

## 2022-04-05 DIAGNOSIS — M85851 Other specified disorders of bone density and structure, right thigh: Secondary | ICD-10-CM | POA: Diagnosis not present

## 2022-04-05 DIAGNOSIS — Z780 Asymptomatic menopausal state: Secondary | ICD-10-CM | POA: Diagnosis not present

## 2022-04-05 DIAGNOSIS — M85852 Other specified disorders of bone density and structure, left thigh: Secondary | ICD-10-CM | POA: Diagnosis not present

## 2022-04-21 DIAGNOSIS — E782 Mixed hyperlipidemia: Secondary | ICD-10-CM | POA: Diagnosis not present

## 2022-04-21 DIAGNOSIS — N1832 Chronic kidney disease, stage 3b: Secondary | ICD-10-CM | POA: Diagnosis not present

## 2022-04-21 DIAGNOSIS — I129 Hypertensive chronic kidney disease with stage 1 through stage 4 chronic kidney disease, or unspecified chronic kidney disease: Secondary | ICD-10-CM | POA: Diagnosis not present

## 2022-04-21 DIAGNOSIS — R7303 Prediabetes: Secondary | ICD-10-CM | POA: Diagnosis not present

## 2022-04-21 DIAGNOSIS — I10 Essential (primary) hypertension: Secondary | ICD-10-CM | POA: Diagnosis not present

## 2022-04-21 DIAGNOSIS — N2889 Other specified disorders of kidney and ureter: Secondary | ICD-10-CM | POA: Diagnosis not present

## 2022-05-02 ENCOUNTER — Emergency Department (HOSPITAL_COMMUNITY): Payer: Medicare Other

## 2022-05-02 ENCOUNTER — Emergency Department (HOSPITAL_COMMUNITY)
Admission: EM | Admit: 2022-05-02 | Discharge: 2022-05-02 | Disposition: A | Discharge status: Home / Self Care | Payer: Medicare Other | Attending: Emergency Medicine | Admitting: Emergency Medicine

## 2022-05-02 ENCOUNTER — Encounter (HOSPITAL_COMMUNITY): Payer: Self-pay

## 2022-05-02 DIAGNOSIS — R109 Unspecified abdominal pain: Secondary | ICD-10-CM | POA: Diagnosis not present

## 2022-05-02 DIAGNOSIS — R630 Anorexia: Secondary | ICD-10-CM | POA: Insufficient documentation

## 2022-05-02 DIAGNOSIS — R112 Nausea with vomiting, unspecified: Secondary | ICD-10-CM | POA: Insufficient documentation

## 2022-05-02 DIAGNOSIS — I10 Essential (primary) hypertension: Secondary | ICD-10-CM | POA: Diagnosis not present

## 2022-05-02 DIAGNOSIS — R1114 Bilious vomiting: Secondary | ICD-10-CM

## 2022-05-02 DIAGNOSIS — R1030 Lower abdominal pain, unspecified: Secondary | ICD-10-CM | POA: Insufficient documentation

## 2022-05-02 DIAGNOSIS — K449 Diaphragmatic hernia without obstruction or gangrene: Secondary | ICD-10-CM | POA: Diagnosis not present

## 2022-05-02 DIAGNOSIS — N390 Urinary tract infection, site not specified: Secondary | ICD-10-CM | POA: Diagnosis not present

## 2022-05-02 DIAGNOSIS — I700 Atherosclerosis of aorta: Secondary | ICD-10-CM | POA: Diagnosis not present

## 2022-05-02 DIAGNOSIS — I160 Hypertensive urgency: Secondary | ICD-10-CM | POA: Diagnosis not present

## 2022-05-02 LAB — URINALYSIS, ROUTINE W REFLEX MICROSCOPIC
Bilirubin Urine: NEGATIVE
Glucose, UA: NEGATIVE mg/dL
Ketones, ur: NEGATIVE mg/dL
Nitrite: NEGATIVE
Protein, ur: >=300 mg/dL — AB
Specific Gravity, Urine: 1.014 (ref 1.005–1.030)
WBC, UA: >50 WBC/hpf — ABNORMAL HIGH (ref 0–5)
pH: 5 (ref 5.0–8.0)

## 2022-05-02 LAB — CBC WITH DIFFERENTIAL/PLATELET
Abs Immature Granulocytes: 0.02 10*3/uL (ref 0.00–0.07)
Basophils Absolute: 0.1 10*3/uL (ref 0.0–0.1)
Basophils Relative: 1 %
Eosinophils Absolute: 0 10*3/uL (ref 0.0–0.5)
Eosinophils Relative: 0 %
HCT: 49 % — ABNORMAL HIGH (ref 36.0–46.0)
Hemoglobin: 13.9 g/dL (ref 12.0–15.0)
Immature Granulocytes: 0 %
Lymphocytes Relative: 17 %
Lymphs Abs: 1.5 10*3/uL (ref 0.7–4.0)
MCH: 25.5 pg — ABNORMAL LOW (ref 26.0–34.0)
MCHC: 28.4 g/dL — ABNORMAL LOW (ref 30.0–36.0)
MCV: 89.7 fL (ref 80.0–100.0)
Monocytes Absolute: 0.2 10*3/uL (ref 0.1–1.0)
Monocytes Relative: 2 %
Neutro Abs: 6.9 10*3/uL (ref 1.7–7.7)
Neutrophils Relative %: 80 %
Platelets: 327 10*3/uL (ref 150–400)
RBC: 5.46 MIL/uL — ABNORMAL HIGH (ref 3.87–5.11)
RDW: 16.2 % — ABNORMAL HIGH (ref 11.5–15.5)
WBC: 8.7 10*3/uL (ref 4.0–10.5)
nRBC: 0 % (ref 0.0–0.2)

## 2022-05-02 LAB — COMPREHENSIVE METABOLIC PANEL
ALT: 16 U/L (ref 0–44)
AST: 25 U/L (ref 15–41)
Albumin: 4.3 g/dL (ref 3.5–5.0)
Alkaline Phosphatase: 112 U/L (ref 38–126)
Anion gap: 10 (ref 5–15)
BUN: 34 mg/dL — ABNORMAL HIGH (ref 8–23)
CO2: 20 mmol/L — ABNORMAL LOW (ref 22–32)
Calcium: 10.3 mg/dL (ref 8.9–10.3)
Chloride: 112 mmol/L — ABNORMAL HIGH (ref 98–111)
Creatinine, Ser: 1.58 mg/dL — ABNORMAL HIGH (ref 0.44–1.00)
GFR, Estimated: 34 mL/min — ABNORMAL LOW (ref >60)
Glucose, Bld: 133 mg/dL — ABNORMAL HIGH (ref 70–99)
Potassium: 4.6 mmol/L (ref 3.5–5.1)
Sodium: 142 mmol/L (ref 135–145)
Total Bilirubin: 0.9 mg/dL (ref 0.3–1.2)
Total Protein: 9.2 g/dL — ABNORMAL HIGH (ref 6.5–8.1)

## 2022-05-02 LAB — BRAIN NATRIURETIC PEPTIDE: B Natriuretic Peptide: 93.6 pg/mL (ref 0.0–100.0)

## 2022-05-02 MED ORDER — CARVEDILOL 12.5 MG PO TABS
25.0000 mg | ORAL_TABLET | Freq: Two times a day (BID) | ORAL | Status: DC
  Administered 2022-05-02 (×2): 25 mg via ORAL
  Filled 2022-05-02 (×2): qty 2

## 2022-05-02 MED ORDER — LABETALOL HCL 5 MG/ML IV SOLN
20.0000 mg | Freq: Once | INTRAVENOUS | Status: AC
  Administered 2022-05-02: 20 mg via INTRAVENOUS
  Filled 2022-05-02: qty 4

## 2022-05-02 MED ORDER — SODIUM CHLORIDE 0.9 % IV BOLUS
500.0000 mL | Freq: Once | INTRAVENOUS | Status: AC
  Administered 2022-05-02: 500 mL via INTRAVENOUS

## 2022-05-02 MED ORDER — CLONIDINE HCL 0.1 MG PO TABS
0.2000 mg | ORAL_TABLET | Freq: Three times a day (TID) | ORAL | Status: DC
  Administered 2022-05-02 (×2): 0.2 mg via ORAL
  Filled 2022-05-02 (×2): qty 2

## 2022-05-02 MED ORDER — AMLODIPINE BESYLATE 5 MG PO TABS
10.0000 mg | ORAL_TABLET | Freq: Every day | ORAL | Status: DC
  Administered 2022-05-02: 10 mg via ORAL
  Filled 2022-05-02: qty 2

## 2022-05-02 MED ORDER — SODIUM CHLORIDE 0.9 % IV SOLN
1.0000 g | Freq: Once | INTRAVENOUS | Status: AC
  Administered 2022-05-02: 1 g via INTRAVENOUS
  Filled 2022-05-02: qty 10

## 2022-05-02 MED ORDER — IOHEXOL 300 MG/ML  SOLN
100.0000 mL | Freq: Once | INTRAMUSCULAR | Status: AC | PRN
  Administered 2022-05-02: 100 mL via INTRAVENOUS

## 2022-05-02 MED ORDER — CEPHALEXIN 500 MG PO CAPS: 500.0000 mg | ORAL_CAPSULE | Freq: Two times a day (BID) | ORAL | 0 refills | Status: DC

## 2022-05-02 MED ORDER — SODIUM CHLORIDE (PF) 0.9 % IJ SOLN
INTRAMUSCULAR | Status: AC
  Filled 2022-05-02: qty 50

## 2022-05-02 MED ORDER — ONDANSETRON HCL 4 MG/2ML IJ SOLN
4.0000 mg | Freq: Once | INTRAMUSCULAR | Status: AC
  Administered 2022-05-02: 4 mg via INTRAVENOUS
  Filled 2022-05-02: qty 2

## 2022-05-02 NOTE — ED Notes (Signed)
IV attempted x2 without success.

## 2022-05-02 NOTE — ED Provider Notes (Signed)
[place] DEPT Provider Note   CSN: [id number] Arrival date & time: 05/02/22  4098     History  Chief Complaint  Patient presents with   Emesis    Tamara Friedman is a 71 y.o. female.  HPI Presents with 2 days of nausea, vomiting, lower abdominal pain.  She notes that she has had similar symptoms previously, was well until about 2 days ago.  Since onset 2 days ago, symptoms have been persistent, with inability to take her home medications, due to nausea, vomiting, anorexia.  Pain is in the lower abdomen, intermittent. No fever, chest pain, dyspnea.    Home Medications Prior to Admission medications   Medication Sig Start Date End Date Taking? Authorizing Provider  albuterol (VENTOLIN HFA) 108 (90 Base) MCG/ACT inhaler Inhale 1-2 puffs into the lungs every 6 (six) hours as needed for wheezing or shortness of breath.   Yes [provider]  amLODipine (NORVASC) 10 MG tablet Take 1 tablet (10 mg total) by mouth daily. 12/30/21  Yes Eulogio Bear U, DO  aspirin 81 MG tablet Take 81 mg by mouth daily.    Yes [provider]  atorvastatin (LIPITOR) 10 MG tablet Take 10 mg by mouth daily.   Yes [provider]  carvedilol (COREG) 25 MG tablet Take 25 mg by mouth 2 (two) times daily with a meal.   Yes [provider]  cephALEXin (KEFLEX) 500 MG capsule Take 1 capsule (500 mg total) by mouth 2 (two) times daily for 5 days. 05/02/22 05/07/22 Yes Carmin Muskrat, MD  cloNIDine (CATAPRES) 0.2 MG tablet Take 0.2 mg by mouth 3 (three) times daily. 11/18/21  Yes [provider]  magnesium hydroxide (MILK OF MAGNESIA) 400 MG/5ML suspension Take 30 mLs by mouth daily as needed for mild constipation. 12/29/21  Yes Vann, Jessica U, DO  MELATONIN PO Take 1 tablet by mouth at bedtime.   Yes [provider]  Multiple Vitamins-Minerals (MULTIVITAMIN ADULTS) TABS Take 1 tablet by mouth daily after supper.   Yes [provider]  polyethylene glycol (MIRALAX / GLYCOLAX) 17 g packet Take 17 g by mouth daily. Patient taking differently: Take 17 g by mouth daily as needed for mild constipation. 03/16/22  Yes Bonnielee Haff, MD  ondansetron (ZOFRAN-ODT) 8 MG disintegrating tablet Take 1 tablet (8 mg total) by mouth every 8 (eight) hours as needed for nausea or vomiting. Patient not taking: Reported on 05/02/2022 11/30/21   Kathie Dike, MD  oxyCODONE (OXY IR/ROXICODONE) 5 MG immediate release tablet Take 1 tablet (5 mg total) by mouth every 6 (six) hours as needed for severe pain. Patient not taking: Reported on 05/02/2022 03/16/22   Bonnielee Haff, MD      Allergies    Patient has no known allergies.    Review of Systems   Review of Systems  All other systems reviewed and are negative.   Physical Exam Updated Vital Signs BP (!) 164/105   Pulse 89   Temp 97.7 F (36.5 C) (Oral)   Resp (!) 23   SpO2 99%  Physical Exam Vitals and nursing note reviewed.  Constitutional:      General: She is not in acute distress.    Appearance: She is well-developed.  HENT:     Head: Normocephalic and atraumatic.  Eyes:     Conjunctiva/sclera: Conjunctivae normal.  Cardiovascular:     Rate and Rhythm: Normal rate and regular rhythm.  Pulmonary:     Effort: Pulmonary effort  is normal. No respiratory distress.     Breath sounds: Normal breath sounds. No stridor.  Abdominal:     General: There is no distension.     Tenderness: There is abdominal tenderness. There is no guarding.     Comments: Patient does have mild tenderness with palpation throughout, but no guarding, rebound, and when distracted, does not react as if she is feeling pain.  Skin:    General: Skin is warm and dry.  Neurological:     Mental Status: She is alert and oriented to person, place, and time.     Cranial Nerves: No cranial nerve deficit.  Psychiatric:        Mood and Affect: Mood normal.     ED Results / Procedures / Treatments    Labs (all labs ordered are listed, but only abnormal results are displayed) Labs Reviewed  CBC WITH DIFFERENTIAL/PLATELET - Abnormal; Notable for the following components:      Result Value   RBC 5.46 (*)    HCT 49.0 (*)    MCH 25.5 (*)    MCHC 28.4 (*)    RDW 16.2 (*)    All other components within normal limits  URINALYSIS, ROUTINE W REFLEX MICROSCOPIC - Abnormal; Notable for the following components:   APPearance CLOUDY (*)    Hgb urine dipstick SMALL (*)    Protein, ur >=300 (*)    Leukocytes,Ua LARGE (*)    WBC, UA >50 (*)    Bacteria, UA MANY (*)    All other components within normal limits  COMPREHENSIVE METABOLIC PANEL - Abnormal; Notable for the following components:   Chloride 112 (*)    CO2 20 (*)    Glucose, Bld 133 (*)    BUN 34 (*)    Creatinine, Ser 1.58 (*)    Total Protein 9.2 (*)    GFR, Estimated 34 (*)    All other components within normal limits  BRAIN NATRIURETIC PEPTIDE    EKG EKG Interpretation  Date/Time:  Monday May 02 2022 10:10:16 EDT Ventricular Rate:  80 PR Interval:  188 QRS Duration: 89 QT Interval:  398 QTC Calculation: 460 R Axis:   29 Text Interpretation: Sinus rhythm Left ventricular hypertrophy Abnormal ECG Confirmed by Carmin Muskrat [phone]) on 05/02/2022 10:19:14 AM  Radiology CT ABDOMEN PELVIS W CONTRAST  Result Date: 05/02/2022 CLINICAL DATA:  Acute abdominal pain EXAM: CT ABDOMEN AND PELVIS WITH CONTRAST TECHNIQUE: Multidetector CT imaging of the abdomen and pelvis was performed using the standard protocol following bolus administration of intravenous contrast. RADIATION DOSE REDUCTION: This exam was performed according to the departmental dose-optimization program which includes automated exposure control, adjustment of the mA and/or kV according to patient size and/or use of iterative reconstruction technique. CONTRAST:  154m OMNIPAQUE IOHEXOL 300 MG/ML  SOLN COMPARISON:  03/12/2022 and previous FINDINGS: Lower chest:  No pleural or pericardial effusion. Moderate hiatal hernia. Minimal dependent atelectasis in the lung bases. Hepatobiliary: No focal liver abnormality is seen. No gallstones, gallbladder wall thickening, or biliary dilatation. Pancreas: Unremarkable. No pancreatic ductal dilatation or surrounding inflammatory changes. Spleen: Normal in size without focal abnormality. Adrenals/Urinary Tract: No adrenal mass. 2.2 cm exophytic 31 HU left upper pole probable cyst; no follow-up indicated. Stable 2.3 cm 2 HU mid left renal cyst; no follow-up indicated. No hydronephrosis or evident urolithiasis. Urinary bladder incompletely distended. Stomach/Bowel: Moderate hiatal hernia involving the gastric fundus. The stomach is nondilated. Small bowel decompressed. Normal appendix. The colon is incompletely distended with multiple descending  and sigmoid diverticula; no adjacent inflammatory change. Vascular/Lymphatic: Heavy aortoiliac calcified atheromatous plaque without aneurysm. stenosis of the common iliac arteries of indeterminate hemodynamic significance. No abdominal or pelvic adenopathy. Reproductive: Status post hysterectomy. No adnexal masses. Other: Bilateral pelvic phleboliths.  No ascites.  No free air. Musculoskeletal: Small paraumbilical hernia containing only mesenteric fat. Mild facet DJD in the lower lumbar spine. Bilateral hip DJD. No acute bone findings. IMPRESSION: 1. No acute findings. 2. Moderate hiatal hernia involving the entire gastric fundus. 3. Descending and sigmoid diverticulosis. 4.  Aortic Atherosclerosis (ICD10-170.0). Electronically Signed   By: Lucrezia Europe M.D.   On: 05/02/2022 13:19   DG Chest Port 1 View  Result Date: 05/02/2022 CLINICAL DATA:  Vomiting and nausea beginning yesterday. EXAM: PORTABLE CHEST 1 VIEW COMPARISON:  11/28/2021 FINDINGS: Heart size is normal. Ectasia of thoracic aorta again noted. A small to moderate hiatal hernia is seen. Both lungs are clear. IMPRESSION: No active  cardiopulmonary disease. Small to moderate hiatal hernia. Electronically Signed   By: Marlaine Hind M.D.   On: 05/02/2022 10:08    Procedures Procedures    Medications Ordered in ED Medications  amLODipine (NORVASC) tablet 10 mg (10 mg Oral Given 05/02/22 1241)  carvedilol (COREG) tablet 25 mg (25 mg Oral Given 05/02/22 1241)  cloNIDine (CATAPRES) tablet 0.2 mg (0.2 mg Oral Given 05/02/22 1241)  ondansetron (ZOFRAN) injection 4 mg (4 mg Intravenous Given 05/02/22 1117)  sodium chloride 0.9 % bolus 500 mL (0 mLs Intravenous Stopped 05/02/22 1220)  labetalol (NORMODYNE) injection 20 mg (20 mg Intravenous Given 05/02/22 1118)  sodium chloride (PF) 0.9 % injection (  Given 05/02/22 1306)  iohexol (OMNIPAQUE) 300 MG/ML solution 100 mL (100 mLs Intravenous Contrast Given 05/02/22 1245)  cefTRIAXone (ROCEPHIN) 1 g in sodium chloride 0.9 % 100 mL IVPB (0 g Intravenous Stopped 05/02/22 1600)    ED Course/ Medical Decision Making/ A&P                           Medical Decision Making Adult female with multiple medical issues including hypertension, diabetes, CKD, prior episodes of nausea, vomiting, p.o. intolerance presents with hypertensive urgency, p.o. intolerance, nausea, vomiting, concern for infection versus obstruction versus hypertensive crisis.  Amount and/or Complexity of Data Reviewed Labs: ordered. Radiology: ordered.  Risk Prescription drug management.  4:23 PM Patient awake, alert, calm, blood pressure has improved markedly, 148/94.  She has had no additional vomiting.  Abdominal CT reviewed, discussed, no acute abdominal processes, some evidence for urinary tract disease, which may be contributing to her nausea, vomiting.  CT without evidence for perinephric abscess or other obvious pyelonephritis.  Patient improved markedly here, blood pressure improved, she able to tolerate oral meds, patient discharged to follow-up with her physician. Final Clinical Impression(s) / ED Diagnoses Final  diagnoses:  Bilious vomiting with nausea  Hypertensive urgency  Lower urinary tract infectious disease    Rx / DC Orders ED Discharge Orders          Ordered    cephALEXin (KEFLEX) 500 MG capsule  2 times daily        05/02/22 1622              Carmin Muskrat, MD 05/02/22 1623

## 2022-05-02 NOTE — ED Triage Notes (Signed)
Pt arrived via POV, c/o vomiting and nausea since yesterday. Denies any diarrhea or urinary issues.

## 2022-05-02 NOTE — Discharge Instructions (Signed)
Please be sure to follow-up with your physician and discuss today's ED evaluation.  Your lab results have been generally reassuring, and your blood pressure is improved substantially after therapy here.  However, if you develop new, or concerning changes return here for additional evaluation.

## 2022-05-06 ENCOUNTER — Emergency Department (HOSPITAL_COMMUNITY): Payer: Medicare Other

## 2022-05-06 ENCOUNTER — Encounter (HOSPITAL_COMMUNITY): Payer: Self-pay | Admitting: Emergency Medicine

## 2022-05-06 ENCOUNTER — Inpatient Hospital Stay (HOSPITAL_COMMUNITY)
Admission: EM | Admit: 2022-05-06 | Discharge: 2022-05-12 | DRG: 690 | Disposition: A | Discharge status: Home Health | Payer: Medicare Other | Attending: Internal Medicine | Admitting: Internal Medicine

## 2022-05-06 DIAGNOSIS — N3000 Acute cystitis without hematuria: Secondary | ICD-10-CM | POA: Diagnosis not present

## 2022-05-06 DIAGNOSIS — I10 Essential (primary) hypertension: Secondary | ICD-10-CM | POA: Diagnosis not present

## 2022-05-06 DIAGNOSIS — M7989 Other specified soft tissue disorders: Secondary | ICD-10-CM | POA: Diagnosis not present

## 2022-05-06 DIAGNOSIS — N179 Acute kidney failure, unspecified: Secondary | ICD-10-CM | POA: Diagnosis not present

## 2022-05-06 DIAGNOSIS — Z7982 Long term (current) use of aspirin: Secondary | ICD-10-CM

## 2022-05-06 DIAGNOSIS — Z8249 Family history of ischemic heart disease and other diseases of the circulatory system: Secondary | ICD-10-CM

## 2022-05-06 DIAGNOSIS — W19XXXA Unspecified fall, initial encounter: Secondary | ICD-10-CM | POA: Diagnosis present

## 2022-05-06 DIAGNOSIS — R531 Weakness: Secondary | ICD-10-CM

## 2022-05-06 DIAGNOSIS — E785 Hyperlipidemia, unspecified: Secondary | ICD-10-CM

## 2022-05-06 DIAGNOSIS — I129 Hypertensive chronic kidney disease with stage 1 through stage 4 chronic kidney disease, or unspecified chronic kidney disease: Secondary | ICD-10-CM | POA: Diagnosis not present

## 2022-05-06 DIAGNOSIS — E7800 Pure hypercholesterolemia, unspecified: Secondary | ICD-10-CM | POA: Diagnosis not present

## 2022-05-06 DIAGNOSIS — B952 Enterococcus as the cause of diseases classified elsewhere: Secondary | ICD-10-CM | POA: Diagnosis present

## 2022-05-06 DIAGNOSIS — E872 Acidosis, unspecified: Secondary | ICD-10-CM | POA: Diagnosis not present

## 2022-05-06 DIAGNOSIS — M47816 Spondylosis without myelopathy or radiculopathy, lumbar region: Secondary | ICD-10-CM | POA: Diagnosis not present

## 2022-05-06 DIAGNOSIS — R079 Chest pain, unspecified: Secondary | ICD-10-CM | POA: Diagnosis present

## 2022-05-06 DIAGNOSIS — E860 Dehydration: Secondary | ICD-10-CM | POA: Diagnosis not present

## 2022-05-06 DIAGNOSIS — Z90711 Acquired absence of uterus with remaining cervical stump: Secondary | ICD-10-CM | POA: Diagnosis not present

## 2022-05-06 DIAGNOSIS — N2889 Other specified disorders of kidney and ureter: Secondary | ICD-10-CM | POA: Diagnosis not present

## 2022-05-06 DIAGNOSIS — N184 Chronic kidney disease, stage 4 (severe): Secondary | ICD-10-CM | POA: Diagnosis present

## 2022-05-06 DIAGNOSIS — I878 Other specified disorders of veins: Secondary | ICD-10-CM | POA: Diagnosis not present

## 2022-05-06 DIAGNOSIS — R109 Unspecified abdominal pain: Secondary | ICD-10-CM

## 2022-05-06 DIAGNOSIS — Z1621 Resistance to vancomycin: Secondary | ICD-10-CM | POA: Diagnosis not present

## 2022-05-06 DIAGNOSIS — R112 Nausea with vomiting, unspecified: Secondary | ICD-10-CM | POA: Diagnosis present

## 2022-05-06 DIAGNOSIS — Z79890 Hormone replacement therapy (postmenopausal): Secondary | ICD-10-CM | POA: Diagnosis not present

## 2022-05-06 DIAGNOSIS — K449 Diaphragmatic hernia without obstruction or gangrene: Secondary | ICD-10-CM | POA: Diagnosis not present

## 2022-05-06 DIAGNOSIS — Z79899 Other long term (current) drug therapy: Secondary | ICD-10-CM | POA: Diagnosis not present

## 2022-05-06 DIAGNOSIS — R1110 Vomiting, unspecified: Secondary | ICD-10-CM | POA: Diagnosis not present

## 2022-05-06 DIAGNOSIS — R0789 Other chest pain: Secondary | ICD-10-CM | POA: Diagnosis not present

## 2022-05-06 DIAGNOSIS — E876 Hypokalemia: Secondary | ICD-10-CM | POA: Diagnosis not present

## 2022-05-06 LAB — COMPREHENSIVE METABOLIC PANEL
ALT: 13 U/L (ref 0–44)
AST: 11 U/L — ABNORMAL LOW (ref 15–41)
Albumin: 3.8 g/dL (ref 3.5–5.0)
Alkaline Phosphatase: 69 U/L (ref 38–126)
Anion gap: 10 (ref 5–15)
BUN: 50 mg/dL — ABNORMAL HIGH (ref 8–23)
CO2: 16 mmol/L — ABNORMAL LOW (ref 22–32)
Calcium: 9.1 mg/dL (ref 8.9–10.3)
Chloride: 111 mmol/L (ref 98–111)
Creatinine, Ser: 2.5 mg/dL — ABNORMAL HIGH (ref 0.44–1.00)
GFR, Estimated: 20 mL/min — ABNORMAL LOW (ref >60)
Glucose, Bld: 103 mg/dL — ABNORMAL HIGH (ref 70–99)
Potassium: 3.8 mmol/L (ref 3.5–5.1)
Sodium: 137 mmol/L (ref 135–145)
Total Bilirubin: 0.6 mg/dL (ref 0.3–1.2)
Total Protein: 7.6 g/dL (ref 6.5–8.1)

## 2022-05-06 LAB — LIPASE, BLOOD: Lipase: 108 U/L — ABNORMAL HIGH (ref 11–51)

## 2022-05-06 LAB — CBC WITH DIFFERENTIAL/PLATELET
Abs Immature Granulocytes: 0.02 K/uL (ref 0.00–0.07)
Basophils Absolute: 0.1 K/uL (ref 0.0–0.1)
Basophils Relative: 1 %
Eosinophils Absolute: 1 K/uL — ABNORMAL HIGH (ref 0.0–0.5)
Eosinophils Relative: 11 %
HCT: 40.2 % (ref 36.0–46.0)
Hemoglobin: 12.1 g/dL (ref 12.0–15.0)
Immature Granulocytes: 0 %
Lymphocytes Relative: 28 %
Lymphs Abs: 2.5 K/uL (ref 0.7–4.0)
MCH: 25.5 pg — ABNORMAL LOW (ref 26.0–34.0)
MCHC: 30.1 g/dL (ref 30.0–36.0)
MCV: 84.6 fL (ref 80.0–100.0)
Monocytes Absolute: 0.8 K/uL (ref 0.1–1.0)
Monocytes Relative: 8 %
Neutro Abs: 4.8 K/uL (ref 1.7–7.7)
Neutrophils Relative %: 52 %
Platelets: 311 K/uL (ref 150–400)
RBC: 4.75 MIL/uL (ref 3.87–5.11)
RDW: 16.4 % — ABNORMAL HIGH (ref 11.5–15.5)
WBC: 9.1 K/uL (ref 4.0–10.5)
nRBC: 0 % (ref 0.0–0.2)

## 2022-05-06 LAB — TROPONIN I (HIGH SENSITIVITY): Troponin I (High Sensitivity): 7 ng/L

## 2022-05-06 MED ORDER — ONDANSETRON 4 MG PO TBDP: 4.0000 mg | ORAL_TABLET | Freq: Once | ORAL | Status: DC

## 2022-05-06 NOTE — ED Notes (Signed)
Dark Grn and Blue top tube have been sent down with blood work.

## 2022-05-06 NOTE — ED Triage Notes (Signed)
Patient c/o chest pain, headache, and nausea today.

## 2022-05-06 NOTE — ED Provider Triage Note (Signed)
Emergency Medicine Provider Triage Evaluation Note  Tamara Friedman , a 71 y.o. female  was evaluated in triage.  Pt complains of abd pain, chest pain, vomiting.  Seen multiple times for same. Has CT done 10/2.  Review of Systems  Positive: Abd pain, chest pain, n/v Negative: Fevers, urinary symptoms  Physical Exam  BP (!) 129/90   Pulse 77   Temp 98.4 F (36.9 C) (Oral)   Resp 20   SpO2 100%  Gen:   Awake, no distress    Resp:  Normal effort   MSK:   Moves extremities without difficulty   Other:     Medical Decision Making  Medically screening exam initiated at 6:28 PM.  Appropriate orders placed.  Tamara Friedman was informed that the remainder of the evaluation will be completed by another provider, this initial triage assessment does not replace that evaluation, and the importance of remaining in the ED until their evaluation is complete.      Tamara Johns, MD 05/06/22 [phone]

## 2022-05-06 NOTE — ED Notes (Signed)
Attempted x 2 for bloodwork, unsuccessful

## 2022-05-07 ENCOUNTER — Encounter (HOSPITAL_COMMUNITY): Payer: Self-pay | Admitting: Family Medicine

## 2022-05-07 ENCOUNTER — Inpatient Hospital Stay (HOSPITAL_COMMUNITY): Payer: Medicare Other

## 2022-05-07 ENCOUNTER — Other Ambulatory Visit: Payer: Self-pay

## 2022-05-07 DIAGNOSIS — E7800 Pure hypercholesterolemia, unspecified: Secondary | ICD-10-CM | POA: Diagnosis present

## 2022-05-07 DIAGNOSIS — N184 Chronic kidney disease, stage 4 (severe): Secondary | ICD-10-CM | POA: Diagnosis present

## 2022-05-07 DIAGNOSIS — R1084 Generalized abdominal pain: Secondary | ICD-10-CM

## 2022-05-07 DIAGNOSIS — N3000 Acute cystitis without hematuria: Principal | ICD-10-CM

## 2022-05-07 DIAGNOSIS — E785 Hyperlipidemia, unspecified: Secondary | ICD-10-CM

## 2022-05-07 DIAGNOSIS — Z79890 Hormone replacement therapy (postmenopausal): Secondary | ICD-10-CM | POA: Diagnosis not present

## 2022-05-07 DIAGNOSIS — I10 Essential (primary) hypertension: Secondary | ICD-10-CM | POA: Diagnosis not present

## 2022-05-07 DIAGNOSIS — E872 Acidosis, unspecified: Secondary | ICD-10-CM | POA: Diagnosis present

## 2022-05-07 DIAGNOSIS — I129 Hypertensive chronic kidney disease with stage 1 through stage 4 chronic kidney disease, or unspecified chronic kidney disease: Secondary | ICD-10-CM | POA: Diagnosis present

## 2022-05-07 DIAGNOSIS — R112 Nausea with vomiting, unspecified: Secondary | ICD-10-CM

## 2022-05-07 DIAGNOSIS — Z7982 Long term (current) use of aspirin: Secondary | ICD-10-CM | POA: Diagnosis not present

## 2022-05-07 DIAGNOSIS — Z8249 Family history of ischemic heart disease and other diseases of the circulatory system: Secondary | ICD-10-CM | POA: Diagnosis not present

## 2022-05-07 DIAGNOSIS — R079 Chest pain, unspecified: Secondary | ICD-10-CM | POA: Diagnosis present

## 2022-05-07 DIAGNOSIS — N179 Acute kidney failure, unspecified: Secondary | ICD-10-CM

## 2022-05-07 DIAGNOSIS — W19XXXA Unspecified fall, initial encounter: Secondary | ICD-10-CM | POA: Diagnosis present

## 2022-05-07 DIAGNOSIS — Z800 Family history of malignant neoplasm of digestive organs: Secondary | ICD-10-CM | POA: Insufficient documentation

## 2022-05-07 DIAGNOSIS — B952 Enterococcus as the cause of diseases classified elsewhere: Secondary | ICD-10-CM | POA: Diagnosis present

## 2022-05-07 DIAGNOSIS — Z90711 Acquired absence of uterus with remaining cervical stump: Secondary | ICD-10-CM | POA: Diagnosis not present

## 2022-05-07 DIAGNOSIS — R531 Weakness: Secondary | ICD-10-CM

## 2022-05-07 DIAGNOSIS — E876 Hypokalemia: Secondary | ICD-10-CM | POA: Diagnosis not present

## 2022-05-07 DIAGNOSIS — E860 Dehydration: Secondary | ICD-10-CM | POA: Diagnosis present

## 2022-05-07 DIAGNOSIS — Z1621 Resistance to vancomycin: Secondary | ICD-10-CM | POA: Diagnosis present

## 2022-05-07 DIAGNOSIS — M7989 Other specified soft tissue disorders: Secondary | ICD-10-CM | POA: Diagnosis not present

## 2022-05-07 DIAGNOSIS — Z79899 Other long term (current) drug therapy: Secondary | ICD-10-CM | POA: Diagnosis not present

## 2022-05-07 LAB — URINALYSIS, ROUTINE W REFLEX MICROSCOPIC
Bilirubin Urine: NEGATIVE
Glucose, UA: NEGATIVE mg/dL
Hgb urine dipstick: NEGATIVE
Ketones, ur: NEGATIVE mg/dL
Nitrite: NEGATIVE
Protein, ur: NEGATIVE mg/dL
Specific Gravity, Urine: 1.013 (ref 1.005–1.030)
pH: 5 (ref 5.0–8.0)

## 2022-05-07 LAB — MAGNESIUM: Magnesium: 2.3 mg/dL (ref 1.7–2.4)

## 2022-05-07 LAB — COMPREHENSIVE METABOLIC PANEL
ALT: 10 U/L (ref 0–44)
AST: 10 U/L — ABNORMAL LOW (ref 15–41)
Albumin: 3.1 g/dL — ABNORMAL LOW (ref 3.5–5.0)
Alkaline Phosphatase: 58 U/L (ref 38–126)
Anion gap: 8 (ref 5–15)
BUN: 43 mg/dL — ABNORMAL HIGH (ref 8–23)
CO2: 18 mmol/L — ABNORMAL LOW (ref 22–32)
Calcium: 8 mg/dL — ABNORMAL LOW (ref 8.9–10.3)
Chloride: 115 mmol/L — ABNORMAL HIGH (ref 98–111)
Creatinine, Ser: 2.03 mg/dL — ABNORMAL HIGH (ref 0.44–1.00)
GFR, Estimated: 25 mL/min — ABNORMAL LOW (ref >60)
Glucose, Bld: 127 mg/dL — ABNORMAL HIGH (ref 70–99)
Potassium: 3.4 mmol/L — ABNORMAL LOW (ref 3.5–5.1)
Sodium: 141 mmol/L (ref 135–145)
Total Bilirubin: 0.6 mg/dL (ref 0.3–1.2)
Total Protein: 6.4 g/dL — ABNORMAL LOW (ref 6.5–8.1)

## 2022-05-07 LAB — CBC
HCT: 38.2 % (ref 36.0–46.0)
Hemoglobin: 11.8 g/dL — ABNORMAL LOW (ref 12.0–15.0)
MCH: 25.8 pg — ABNORMAL LOW (ref 26.0–34.0)
MCHC: 30.9 g/dL (ref 30.0–36.0)
MCV: 83.4 fL (ref 80.0–100.0)
Platelets: 285 10*3/uL (ref 150–400)
RBC: 4.58 MIL/uL (ref 3.87–5.11)
RDW: 16.6 % — ABNORMAL HIGH (ref 11.5–15.5)
WBC: 9.9 10*3/uL (ref 4.0–10.5)
nRBC: 0 % (ref 0.0–0.2)

## 2022-05-07 LAB — PHOSPHORUS: Phosphorus: 3.1 mg/dL (ref 2.5–4.6)

## 2022-05-07 LAB — LIPASE, BLOOD: Lipase: 61 U/L — ABNORMAL HIGH (ref 11–51)

## 2022-05-07 LAB — LACTIC ACID, PLASMA
Lactic Acid, Venous: 1.2 mmol/L (ref 0.5–1.9)
Lactic Acid, Venous: 1 mmol/L (ref 0.5–1.9)

## 2022-05-07 LAB — CREATININE, SERUM
Creatinine, Ser: 2.4 mg/dL — ABNORMAL HIGH (ref 0.44–1.00)
GFR, Estimated: 21 mL/min — ABNORMAL LOW (ref >60)

## 2022-05-07 MED ORDER — SODIUM CHLORIDE 0.9 % IV SOLN: INTRAVENOUS | Status: DC

## 2022-05-07 MED ORDER — CARVEDILOL 25 MG PO TABS
25.0000 mg | ORAL_TABLET | Freq: Two times a day (BID) | ORAL | Status: DC
  Administered 2022-05-07 – 2022-05-12 (×11): 25 mg via ORAL
  Filled 2022-05-07 (×9): qty 1
  Filled 2022-05-07: qty 2
  Filled 2022-05-07: qty 1

## 2022-05-07 MED ORDER — SODIUM CHLORIDE 0.9 % IV SOLN
1.0000 g | Freq: Once | INTRAVENOUS | Status: AC
  Administered 2022-05-07: 1 g via INTRAVENOUS
  Filled 2022-05-07: qty 10

## 2022-05-07 MED ORDER — MORPHINE SULFATE (PF) 2 MG/ML IV SOLN
1.0000 mg | INTRAVENOUS | Status: DC | PRN
  Administered 2022-05-07: 1 mg via INTRAVENOUS
  Filled 2022-05-07: qty 1

## 2022-05-07 MED ORDER — HEPARIN SODIUM (PORCINE) 5000 UNIT/ML IJ SOLN
5000.0000 [IU] | Freq: Three times a day (TID) | INTRAMUSCULAR | Status: DC
  Administered 2022-05-07 – 2022-05-12 (×16): 5000 [IU] via SUBCUTANEOUS
  Filled 2022-05-07 (×16): qty 1

## 2022-05-07 MED ORDER — ONDANSETRON HCL 4 MG/2ML IJ SOLN
4.0000 mg | Freq: Once | INTRAMUSCULAR | Status: AC
  Administered 2022-05-07: 4 mg via INTRAVENOUS
  Filled 2022-05-07: qty 2

## 2022-05-07 MED ORDER — SODIUM CHLORIDE 0.9 % IV SOLN
2.0000 g | INTRAVENOUS | Status: DC
  Administered 2022-05-08 – 2022-05-09 (×2): 2 g via INTRAVENOUS
  Filled 2022-05-07 (×2): qty 20

## 2022-05-07 MED ORDER — ACETAMINOPHEN 325 MG PO TABS
650.0000 mg | ORAL_TABLET | Freq: Four times a day (QID) | ORAL | Status: DC | PRN
  Administered 2022-05-08 – 2022-05-12 (×8): 650 mg via ORAL
  Filled 2022-05-07 (×8): qty 2

## 2022-05-07 MED ORDER — OXYCODONE HCL 5 MG PO TABS
5.0000 mg | ORAL_TABLET | Freq: Four times a day (QID) | ORAL | Status: DC | PRN
  Administered 2022-05-07 – 2022-05-12 (×15): 5 mg via ORAL
  Filled 2022-05-07 (×17): qty 1

## 2022-05-07 MED ORDER — ACETAMINOPHEN 650 MG RE SUPP: 650.0000 mg | Freq: Four times a day (QID) | RECTAL | Status: DC | PRN

## 2022-05-07 MED ORDER — ACETAMINOPHEN 325 MG PO TABS: 650.0000 mg | ORAL_TABLET | Freq: Four times a day (QID) | ORAL | Status: DC | PRN

## 2022-05-07 MED ORDER — AMLODIPINE BESYLATE 10 MG PO TABS
10.0000 mg | ORAL_TABLET | Freq: Every day | ORAL | Status: DC
  Administered 2022-05-07 – 2022-05-12 (×6): 10 mg via ORAL
  Filled 2022-05-07: qty 2
  Filled 2022-05-07 (×5): qty 1

## 2022-05-07 MED ORDER — METOCLOPRAMIDE HCL 5 MG/ML IJ SOLN
10.0000 mg | Freq: Once | INTRAMUSCULAR | Status: AC
  Administered 2022-05-07: 10 mg via INTRAVENOUS
  Filled 2022-05-07: qty 2

## 2022-05-07 MED ORDER — SENNOSIDES-DOCUSATE SODIUM 8.6-50 MG PO TABS
1.0000 | ORAL_TABLET | Freq: Every evening | ORAL | Status: DC | PRN
  Administered 2022-05-08: 1 via ORAL
  Filled 2022-05-07: qty 1

## 2022-05-07 MED ORDER — HYDROMORPHONE HCL 1 MG/ML IJ SOLN
0.5000 mg | INTRAMUSCULAR | Status: DC | PRN
  Administered 2022-05-07 – 2022-05-09 (×6): 0.5 mg via INTRAVENOUS
  Filled 2022-05-07 (×4): qty 0.5
  Filled 2022-05-07: qty 1
  Filled 2022-05-07: qty 0.5

## 2022-05-07 MED ORDER — POTASSIUM CHLORIDE IN NACL 20-0.9 MEQ/L-% IV SOLN
INTRAVENOUS | Status: DC
  Filled 2022-05-07 (×5): qty 1000

## 2022-05-07 MED ORDER — ONDANSETRON HCL 4 MG/2ML IJ SOLN
4.0000 mg | Freq: Four times a day (QID) | INTRAMUSCULAR | Status: DC | PRN
  Administered 2022-05-07 – 2022-05-09 (×7): 4 mg via INTRAVENOUS
  Filled 2022-05-07 (×7): qty 2

## 2022-05-07 NOTE — Progress Notes (Signed)
PROGRESS NOTE  Tamara Friedman YDX:[id number] DOB: 21-Mar-1951   PCP: Lucianne Lei, MD  Patient is from: Home.  Lives alone.  She states she has walker and cane at home  DOA: 05/06/2022 LOS: 0  Chief complaints Chief Complaint  Patient presents with   Chest Pain   Headache   Nausea     Brief Narrative / Interim history: 71 year old F with PMH of HTN and hyperlipidemia presenting with intractable nausea, vomiting, chills, lightheadedness and left flank pain for 1 day.  He was seen in ED on 10/2 for N/V/abdominal pain.  At that time, UA concerning for UTI.  CT abdomen and pelvis without acute finding.  She was discharged on p.o. Keflex for possible UTI, that she was not able to keep down.  Urine culture was not sent.  In ED, vitals stable. Cr 2.5 (from 1.58 on 11/2).  BUN 50.  Bicarb 16.  AG 10.  CBC without significant finding.  Troponin negative.  KUB with nonobstructive bowel gas pattern.  UA with moderate LE and rare bacteria.  Urine culture obtained.  Started on IV fluid and IV ceftriaxone and admitted.  Subjective: Seen and examined earlier this morning.  She continues to endorse nausea and vomiting.  Emesis is nonbloody.  She denies diarrhea.  She denies UTI symptoms but reports bilateral flank/abdominal pain.  She rates her pain 8/10.  Objective: Vitals:   05/07/22 0530 05/07/22 0630 05/07/22 0900 05/07/22 0921  BP: (!) 157/78 (!) 135/94 (!) 124/92   Pulse: 74 74 74   Resp: 16  18   Temp:    98.3 F (36.8 C)  TempSrc:    Oral  SpO2: 99% 100% 100%     Examination:  GENERAL: Lying in bed.  Seems to be in pain. HEENT: MMM.  Vision and hearing grossly intact.  NECK: Supple.  No apparent JVD.  RESP:  No IWOB.  Fair aeration bilaterally. CVS:  RRR. Heart sounds normal.  ABD/GI/GU: BS+. Abd soft.  Tenderness over left and right abdomen.  Unable to sit up for CVA tenderness MSK/EXT:  Moves extremities. No apparent deformity. No edema.  SKIN: no apparent skin lesion or  wound NEURO: Awake, alert and oriented appropriately.  No apparent focal neuro deficit. PSYCH: Peers to be in pain.  Procedures:  None  Microbiology summarized: Urine culture pending  Assessment and plan: Principal Problem:   Acute cystitis Active Problems:   Intractable vomiting with nausea   HTN (hypertension)   Dyslipidemia   Generalized weakness   Abdominal pain   Metabolic acidosis  Intractable nausea/vomiting/abdominal pain/dehydration: Due to UTI?  She reports bilateral flank pain.  She has tenderness over the right and left abdomen.  No rebound tenderness.  Her CT on 10/2 without acute finding.  KUB with bowel gas pattern without obstruction.  Ileus?  Lipase is mildly elevated.  She denies drinking alcohol.  Previous UA in 10/2 concerning for UTI.  Urine (time with pyuria and rare bacteria.  She has no leukocytosis or fever. -Increase IV fluid to 125 cc an hour -Continue IV ceftriaxone for possible cystitis. -Continue Zofran as needed. -Adjusted pain med-Tylenol, oxycodone and Dilaudid as needed -CT abdomen and pelvis without contrast -Recheck CMP, lipase and lactic acid  Possible acute cystitis-Previous UA in 10/2 concerning for UTI.  Urine (time with pyuria and rare bacteria.  -Management as above  AKI on CKD-4: Likely due to dehydration. Recent Labs    12/25/21 0436 12/26/21 0304 12/27/21 0321 03/12/22 1140 03/13/22 0414  03/14/22 0335 03/15/22 0426 03/16/22 0441 05/02/22 1138 05/06/22 1827 05/07/22 0707  BUN 22 32* 30* 37* 47* 41* 30* 26* 34* 50*  --   CREATININE 1.58* 2.11* 1.70* 2.24* 2.70* 2.31* 1.70* 1.62* 1.58* 2.50* 2.40*  -IV fluid as above -CT abdomen and pelvis without contrast -Check in the morning  Metabolic acidosis: In the setting of dehydration, AKI and IV fluid -Monitor  Essential hypertension: Blood pressure varies.  Within acceptable range -Continue home amlodipine and Coreg -May resume clonidine at lower dose based on BP throughout  the day  Generalized weakness: Multifactorial.  Reportedly lives alone and has walker at home -PT/OT eval   There is no height or weight on file to calculate BMI.          DVT prophylaxis:  heparin injection 5,000 Units Start: 05/07/22 0600 SCDs Start: 05/07/22 0555  Code Status: Full code Family Communication: None at bedside Level of care: Med-Surg Status is: Inpatient Remains inpatient appropriate because: Intractable nausea, vomiting and abdominal pain and possible UTI and AKI   Final disposition: TBD Consultants:  None  Sch Meds:  Scheduled Meds:  amLODipine  10 mg Oral Daily   carvedilol  25 mg Oral BID WC   heparin  5,000 Units Subcutaneous Q8H   ondansetron  4 mg Oral Once   Continuous Infusions:  sodium chloride 75 mL/hr at 05/07/22 0645   [START ON 05/08/2022] cefTRIAXone (ROCEPHIN)  IV     PRN Meds:.acetaminophen **OR** acetaminophen, HYDROmorphone (DILAUDID) injection, ondansetron (ZOFRAN) IV, oxyCODONE, senna-docusate  Antimicrobials: Anti-infectives (From admission, onward)    Start     Dose/Rate Route Frequency Ordered Stop   05/08/22 0000  cefTRIAXone (ROCEPHIN) 2 g in sodium chloride 0.9 % 100 mL IVPB        2 g 200 mL/hr over 30 Minutes Intravenous Every 24 hours 05/07/22 0557     05/07/22 0500  cefTRIAXone (ROCEPHIN) 1 g in sodium chloride 0.9 % 100 mL IVPB        1 g 200 mL/hr over 30 Minutes Intravenous  Once 05/07/22 0447 05/07/22 0622        I have personally reviewed the following labs and images: CBC: Recent Labs  Lab 05/02/22 0911 05/06/22 1827 05/07/22 0707  WBC 8.7 9.1 9.9  NEUTROABS 6.9 4.8  --   HGB 13.9 12.1 11.8*  HCT 49.0* 40.2 38.2  MCV 89.7 84.6 83.4  PLT 327 311 285   BMP &GFR Recent Labs  Lab 05/02/22 1138 05/06/22 1827 05/07/22 0707  NA 142 137  --   K 4.6 3.8  --   CL 112* 111  --   CO2 20* 16*  --   GLUCOSE 133* 103*  --   BUN 34* 50*  --   CREATININE 1.58* 2.50* 2.40*  CALCIUM 10.3 9.1  --     CrCl cannot be calculated (Unknown ideal weight.). Liver & Pancreas: Recent Labs  Lab 05/02/22 1138 05/06/22 1827  AST 25 11*  ALT 16 13  ALKPHOS 112 69  BILITOT 0.9 0.6  PROT 9.2* 7.6  ALBUMIN 4.3 3.8   Recent Labs  Lab 05/06/22 1827  LIPASE 108*   No results for input(s): "AMMONIA" in the last 168 hours. Diabetic: No results for input(s): "HGBA1C" in the last 72 hours. No results for input(s): "GLUCAP" in the last 168 hours. Cardiac Enzymes: No results for input(s): "CKTOTAL", "CKMB", "CKMBINDEX", "TROPONINI" in the last 168 hours. No results for input(s): "PROBNP" in the last 8760 hours. Coagulation  Profile: No results for input(s): "INR", "PROTIME" in the last 168 hours. Thyroid Function Tests: No results for input(s): "TSH", "T4TOTAL", "FREET4", "T3FREE", "THYROIDAB" in the last 72 hours. Lipid Profile: No results for input(s): "CHOL", "HDL", "LDLCALC", "TRIG", "CHOLHDL", "LDLDIRECT" in the last 72 hours. Anemia Panel: No results for input(s): "VITAMINB12", "FOLATE", "FERRITIN", "TIBC", "IRON", "RETICCTPCT" in the last 72 hours. Urine analysis:    Component Value Date/Time   COLORURINE YELLOW 05/06/2022 1828   APPEARANCEUR HAZY (A) 05/06/2022 1828   LABSPEC 1.013 05/06/2022 1828   PHURINE 5.0 05/06/2022 1828   GLUCOSEU NEGATIVE 05/06/2022 1828   HGBUR NEGATIVE 05/06/2022 1828   BILIRUBINUR NEGATIVE 05/06/2022 1828   KETONESUR NEGATIVE 05/06/2022 1828   PROTEINUR NEGATIVE 05/06/2022 1828   UROBILINOGEN 0.2 07/19/2013 2044   NITRITE NEGATIVE 05/06/2022 1828   LEUKOCYTESUR MODERATE (A) 05/06/2022 1828   Sepsis Labs: Invalid input(s): "PROCALCITONIN", "LACTICIDVEN"  Microbiology: No results found for this or any previous visit (from the past 240 hour(s)).  Radiology Studies: DG ABD ACUTE 2+V W 1V CHEST  Result Date: 05/06/2022 CLINICAL DATA:  Vomiting with chest pain. EXAM: DG ABDOMEN ACUTE WITH 1 VIEW CHEST COMPARISON:  Chest x-ray 05/06/2022. CT  abdomen and pelvis 05/02/2022 FINDINGS: Moderate hiatal hernia is again seen. Cardiomediastinal silhouette is within normal limits. Lungs are clear. No pleural effusion or pneumothorax. There is no free air. Bowel-gas pattern is nonobstructive. There is average stool burden. There are phleboliths in the pelvis. No acute fractures are seen. No suspicious calcifications are identified. IMPRESSION: 1. No acute cardiopulmonary disease. 2. Moderate hiatal hernia. 3. Nonobstructive bowel-gas pattern. Electronically Signed   By: Ronney Asters M.D.   On: 05/06/2022 19:00      Kolby Schara T. [place]  If 7PM-7AM, please contact night-coverage [email or web address] 05/07/2022, 11:57 AM

## 2022-05-07 NOTE — H&P (Signed)
PCP:   Lucianne Lei, MD   Chief Complaint:  Nausea and vomiting  HPI: This is a 71 year old female with past medical history of hypertension and dyslipidemia.  The patient presented to the ER with complaints of nausea, vomiting and chills since yesterday.  When this started yesterday, she presented to the ER and was diagnosed with a UTI.  She was discharged with Keflex.   Twice she attempted taking Keflex but unfortunately due to her nausea and vomiting she is unable to hold any of her meds down.  She now complains of left-sided flank pain, being lightheaded and dizzy.  She states she does not make much urine and it is concentrated.  She denies burning urination.  She returned to the ER.  The hospitalist have been asked to admit for acute cystitis.  History provided by the patient.  Review of Systems:  The patient endorses nausea, vomiting, mild abdominal discomfort, lightheadedness, dizziness, decreased urination and chills.  The patient denies anorexia, fever, weight loss,, vision loss, decreased hearing, hoarseness, chest pain, syncope, dyspnea on exertion, peripheral edema, balance deficits, hemoptysis, melena, hematochezia, severe indigestion/heartburn, hematuria, incontinence, genital sores, muscle weakness, suspicious skin lesions, transient blindness, difficulty walking, depression, unusual weight change, abnormal bleeding, enlarged lymph nodes, angioedema, and breast masses.  Past Medical History: Past Medical History:  Diagnosis Date   High cholesterol    Hypertension    Past Surgical History:  Procedure Laterality Date   PARTIAL HYSTERECTOMY      Medications: Prior to Admission medications   Medication Sig Start Date End Date Taking? Authorizing Provider  albuterol (VENTOLIN HFA) 108 (90 Base) MCG/ACT inhaler Inhale 1-2 puffs into the lungs every 6 (six) hours as needed for wheezing or shortness of breath.   Yes [provider]  amLODipine (NORVASC) 10 MG tablet Take 1  tablet (10 mg total) by mouth daily. 12/30/21  Yes Eulogio Bear U, DO  aspirin 81 MG tablet Take 81 mg by mouth daily.    Yes [provider]  atorvastatin (LIPITOR) 10 MG tablet Take 10 mg by mouth daily.   Yes [provider]  carvedilol (COREG) 25 MG tablet Take 25 mg by mouth 2 (two) times daily with a meal.   Yes [provider]  cloNIDine (CATAPRES) 0.2 MG tablet Take 0.2 mg by mouth 3 (three) times daily. 11/18/21  Yes [provider]  magnesium hydroxide (MILK OF MAGNESIA) 400 MG/5ML suspension Take 30 mLs by mouth daily as needed for mild constipation. 12/29/21  Yes Vann, Jessica U, DO  MELATONIN PO Take 1 tablet by mouth at bedtime.   Yes [provider]  Multiple Vitamins-Minerals (MULTIVITAMIN ADULTS) TABS Take 1 tablet by mouth daily after supper.   Yes [provider]  cephALEXin (KEFLEX) 500 MG capsule Take 1 capsule (500 mg total) by mouth 2 (two) times daily for 5 days. Patient not taking: Reported on 05/07/2022 05/02/22 05/07/22  Carmin Muskrat, MD  ondansetron (ZOFRAN-ODT) 8 MG disintegrating tablet Take 1 tablet (8 mg total) by mouth every 8 (eight) hours as needed for nausea or vomiting. Patient not taking: Reported on 05/02/2022 11/30/21   Kathie Dike, MD  oxyCODONE (OXY IR/ROXICODONE) 5 MG immediate release tablet Take 1 tablet (5 mg total) by mouth every 6 (six) hours as needed for severe pain. Patient not taking: Reported on 05/02/2022 03/16/22   Bonnielee Haff, MD  polyethylene glycol (MIRALAX / GLYCOLAX) 17 g packet Take 17 g by mouth daily. Patient taking differently: Take 17 g  by mouth daily as needed for mild constipation. 03/16/22   Bonnielee Haff, MD    Allergies:  No Known Allergies  Social History:  reports that she has never smoked. She has never used smokeless tobacco. She reports that she does not drink alcohol and does not use drugs.  Family History: Hypertension  Physical Exam: Vitals:   05/07/22  0017 05/07/22 0400 05/07/22 0521 05/07/22 0530  BP: (!) 145/85 (!) 180/82  (!) 157/78  Pulse: 71 73  74  Resp: '16 18  16  '$ Temp: 98.2 F (36.8 C)  99 F (37.2 C)   TempSrc: Oral  Oral   SpO2: 100% 100%  99%    General:  Alert and oriented times three, ill-appearing, nauseous Eyes: PERRLA, pink conjunctiva, no scleral icterus ENT: Dry oral mucosa, neck supple, no thyromegaly Lungs: clear to ascultation, no wheeze, no crackles, no use of accessory muscles Cardiovascular: regular rate and rhythm, no regurgitation, no gallops, no murmurs. No carotid bruits, no JVD Abdomen: soft, positive BS, NTND, no organomegaly, not an acute abdomen.  Left flank tenderness GU: not examined Neuro: CN II - XII grossly intact, sensation intact Musculoskeletal: strength 5/5 all extremities, no clubbing, cyanosis or edema Skin: no rash, no subcutaneous crepitation, no decubitus Psych: appropriate patient   Labs on Admission:  Recent Labs    05/06/22 1827  NA 137  K 3.8  CL 111  CO2 16*  GLUCOSE 103*  BUN 50*  CREATININE 2.50*  CALCIUM 9.1   Recent Labs    05/06/22 1827  AST 11*  ALT 13  ALKPHOS 69  BILITOT 0.6  PROT 7.6  ALBUMIN 3.8   Recent Labs    05/06/22 1827  LIPASE 108*   Recent Labs    05/06/22 1827  WBC 9.1  NEUTROABS 4.8  HGB 12.1  HCT 40.2  MCV 84.6  PLT 311    Micro Results: No results found for this or any previous visit (from the past 240 hour(s)).   Radiological Exams on Admission: DG ABD ACUTE 2+V W 1V CHEST  Result Date: 05/06/2022 CLINICAL DATA:  Vomiting with chest pain. EXAM: DG ABDOMEN ACUTE WITH 1 VIEW CHEST COMPARISON:  Chest x-ray 05/06/2022. CT abdomen and pelvis 05/02/2022 FINDINGS: Moderate hiatal hernia is again seen. Cardiomediastinal silhouette is within normal limits. Lungs are clear. No pleural effusion or pneumothorax. There is no free air. Bowel-gas pattern is nonobstructive. There is average stool burden. There are phleboliths in the  pelvis. No acute fractures are seen. No suspicious calcifications are identified. IMPRESSION: 1. No acute cardiopulmonary disease. 2. Moderate hiatal hernia. 3. Nonobstructive bowel-gas pattern. Electronically Signed   By: Ronney Asters M.D.   On: 05/06/2022 19:00    Assessment/Plan Present on Admission: Acute cystitis, recurrent -Admit to MedSurg -Blood and urine cultures collected -IV Rocephin initiated in the ER, will continue  Acute on chronic kidney disease, stage IV Metabolic acidosis -Correcting with IV fluid hydration -Treating underlying cause acute cystitis with nausea and vomiting. -I's and O's, BMP in a.m.  Intractable nausea and vomiting -Zofran as needed -Due to nausea and vomiting  HTN -Home meds resumed with hold parameters  Dyslidemia -Statin on hold   Tamara Friedman 05/07/2022, 5:39 AM

## 2022-05-07 NOTE — ED Provider Notes (Signed)
[facility] DEPT Provider Note   CSN: [id number] Arrival date & time: 05/06/22  1817     History  Chief Complaint  Patient presents with   Chest Pain        Headache   Nausea    Tamara Friedman is a 71 y.o. female.  The history is provided by the patient and medical records.  Chest Pain Associated symptoms: headache   Headache Tamara Friedman is a 71 y.o. female who presents to the Emergency Department complaining of vomiting.  She presents to the emergency department today for evaluation of recurrent nausea and vomiting.  She was evaluated on Monday and did fall after her discharge and had some pain to her left hip.  She has been able to bear weight since then.  Unsure if she hit her head.  She states that yesterday she started feeling poorly with nausea and vomiting, about 5 episodes of yellow emesis.  Also reports small amount of diarrhea.  No dysuria.  She has associated central abdominal pain as well as bitemporal headache.  She did have some chest pain that is waxing and waning, mild in nature.  She states that when she was seen on Monday she was started on antibiotics but could only take 2 doses before she developed nausea and has been vomiting all medicines for the last 24 hours.  She currently lives alone.  She does have family that checks on her. Nausea today.  Left hip pain after falling on Monday.  Unsure if hit head.  Vomiting today - about five episodes, yellow.  Small amount of diarrhea. No dysuria.  No fever.  Has mild central abdominal pain.        Home Medications Prior to Admission medications   Medication Sig Start Date End Date Taking? Authorizing Provider  albuterol (VENTOLIN HFA) 108 (90 Base) MCG/ACT inhaler Inhale 1-2 puffs into the lungs every 6 (six) hours as needed for wheezing or shortness of breath.   Yes [provider]  amLODipine (NORVASC) 10 MG tablet Take 1 tablet (10 mg total) by mouth daily. 12/30/21  Yes Eulogio Bear U, DO  aspirin 81 MG tablet Take 81 mg by mouth daily.    Yes [provider]  atorvastatin (LIPITOR) 10 MG tablet Take 10 mg by mouth daily.   Yes [provider]  carvedilol (COREG) 25 MG tablet Take 25 mg by mouth 2 (two) times daily with a meal.   Yes [provider]  cloNIDine (CATAPRES) 0.2 MG tablet Take 0.2 mg by mouth 3 (three) times daily. 11/18/21  Yes [provider]  magnesium hydroxide (MILK OF MAGNESIA) 400 MG/5ML suspension Take 30 mLs by mouth daily as needed for mild constipation. 12/29/21  Yes Vann, Jessica U, DO  MELATONIN PO Take 1 tablet by mouth at bedtime.   Yes [provider]  Multiple Vitamins-Minerals (MULTIVITAMIN ADULTS) TABS Take 1 tablet by mouth daily after supper.   Yes [provider]  cephALEXin (KEFLEX) 500 MG capsule Take 1 capsule (500 mg total) by mouth 2 (two) times daily for 5 days. Patient not taking: Reported on 05/07/2022 05/02/22 05/07/22  Carmin Muskrat, MD  ondansetron (ZOFRAN-ODT) 8 MG disintegrating tablet Take 1 tablet (8 mg total) by mouth every 8 (eight) hours as needed for nausea or vomiting. Patient not taking: Reported on 05/02/2022 11/30/21   Kathie Dike, MD  oxyCODONE (OXY IR/ROXICODONE) 5 MG immediate release tablet Take 1 tablet (5 mg total) by  mouth every 6 (six) hours as needed for severe pain. Patient not taking: Reported on 05/02/2022 03/16/22   Bonnielee Haff, MD  polyethylene glycol (MIRALAX / GLYCOLAX) 17 g packet Take 17 g by mouth daily. Patient taking differently: Take 17 g by mouth daily as needed for mild constipation. 03/16/22   Bonnielee Haff, MD      Allergies    Patient has no known allergies.    Review of Systems   Review of Systems  Cardiovascular:  Positive for chest pain.  Neurological:  Positive for headaches.  All other systems reviewed and are negative.   Physical Exam Updated Vital Signs BP (!) 157/78   Pulse 74   Temp 99 F (37.2 C) (Oral)    Resp 16   SpO2 99%  Physical Exam Vitals and nursing note reviewed.  Constitutional:      Appearance: She is well-developed.  HENT:     Head: Normocephalic and atraumatic.  Cardiovascular:     Rate and Rhythm: Normal rate and regular rhythm.     Heart sounds: No murmur heard. Pulmonary:     Effort: Pulmonary effort is normal. No respiratory distress.     Breath sounds: Normal breath sounds.  Abdominal:     Palpations: Abdomen is soft.     Tenderness: There is no guarding or rebound.     Comments: Mild generalized abdominal tenderness  Musculoskeletal:        General: No swelling or tenderness.     Comments: 2+ DP pulses bilaterally.  No TTP over hips bilaterally.  No pain on passive ROM to hips bilaterally  Skin:    General: Skin is warm and dry.  Neurological:     Mental Status: She is alert and oriented to person, place, and time.  Psychiatric:        Behavior: Behavior normal.     ED Results / Procedures / Treatments   Labs (all labs ordered are listed, but only abnormal results are displayed) Labs Reviewed  COMPREHENSIVE METABOLIC PANEL - Abnormal; Notable for the following components:      Result Value   CO2 16 (*)    Glucose, Bld 103 (*)    BUN 50 (*)    Creatinine, Ser 2.50 (*)    AST 11 (*)    GFR, Estimated 20 (*)    All other components within normal limits  LIPASE, BLOOD - Abnormal; Notable for the following components:   Lipase 108 (*)    All other components within normal limits  CBC WITH DIFFERENTIAL/PLATELET - Abnormal; Notable for the following components:   MCH 25.5 (*)    RDW 16.4 (*)    Eosinophils Absolute 1.0 (*)    All other components within normal limits  URINALYSIS, ROUTINE W REFLEX MICROSCOPIC - Abnormal; Notable for the following components:   APPearance HAZY (*)    Leukocytes,Ua MODERATE (*)    Bacteria, UA RARE (*)    All other components within normal limits  URINE CULTURE  TROPONIN I (HIGH SENSITIVITY)  TROPONIN I (HIGH  SENSITIVITY)    EKG EKG Interpretation  Date/Time:  Friday May 06 2022 18:24:39 EDT Ventricular Rate:  82 PR Interval:  215 QRS Duration: 94 QT Interval:  368 QTC Calculation: 430 R Axis:   60 Text Interpretation: Sinus rhythm Borderline prolonged PR interval Probable left atrial enlargement Probable left ventricular hypertrophy Baseline wander in lead(s) V2 Confirmed by Quintella Reichert [phone]) on 05/07/2022 4:01:19 AM  Radiology DG ABD ACUTE 2+V W 1V  CHEST  Result Date: 05/06/2022 CLINICAL DATA:  Vomiting with chest pain. EXAM: DG ABDOMEN ACUTE WITH 1 VIEW CHEST COMPARISON:  Chest x-ray 05/06/2022. CT abdomen and pelvis 05/02/2022 FINDINGS: Moderate hiatal hernia is again seen. Cardiomediastinal silhouette is within normal limits. Lungs are clear. No pleural effusion or pneumothorax. There is no free air. Bowel-gas pattern is nonobstructive. There is average stool burden. There are phleboliths in the pelvis. No acute fractures are seen. No suspicious calcifications are identified. IMPRESSION: 1. No acute cardiopulmonary disease. 2. Moderate hiatal hernia. 3. Nonobstructive bowel-gas pattern. Electronically Signed   By: Ronney Asters M.D.   On: 05/06/2022 19:00    Procedures Procedures    Medications Ordered in ED Medications  ondansetron (ZOFRAN-ODT) disintegrating tablet 4 mg (4 mg Oral Not Given 05/07/22 0539)  0.9 %  sodium chloride infusion (has no administration in time range)  cefTRIAXone (ROCEPHIN) 1 g in sodium chloride 0.9 % 100 mL IVPB (1 g Intravenous New Bag/Given 05/07/22 0531)  ondansetron (ZOFRAN) injection 4 mg (has no administration in time range)  acetaminophen (TYLENOL) tablet 650 mg (has no administration in time range)  morphine (PF) 2 MG/ML injection 1 mg (has no administration in time range)  0.9 %  sodium chloride infusion (has no administration in time range)  ondansetron (ZOFRAN) injection 4 mg (4 mg Intravenous Given 05/07/22 0527)  metoCLOPramide  (REGLAN) injection 10 mg (10 mg Intravenous Given 05/07/22 0528)    ED Course/ Medical Decision Making/ A&P                           Medical Decision Making Amount and/or Complexity of Data Reviewed Labs: ordered. Radiology: ordered.  Risk Prescription drug management. Decision regarding hospitalization.   Patient with history of hypertension, CKD here for evaluation of recurrent nausea and vomiting.  She was recently seen in the emergency department and started on antibiotics for UTI.  She is nontoxic-appearing on evaluation with no focal deficits.  Recent visit reviewed.  Patient had CT scan performed as well as UA and labs.  UA at that time was concerning for UTI.  We will treat with empiric antibiotics and send culture given her symptoms.  She was started on IV fluids for acute kidney injury.  Doubt SBO, acute abdominal series negative.  Medicine consulted for admission for AKI and persistent nausea.        Final Clinical Impression(s) / ED Diagnoses Final diagnoses:  Nausea and vomiting, unspecified vomiting type  AKI (acute kidney injury) [facility])    Rx / [place] Orders ED Discharge Orders     None         Quintella Reichert, MD 05/07/22 [phone]

## 2022-05-08 DIAGNOSIS — E876 Hypokalemia: Secondary | ICD-10-CM

## 2022-05-08 MED ORDER — CYCLOBENZAPRINE HCL 5 MG PO TABS
5.0000 mg | ORAL_TABLET | Freq: Three times a day (TID) | ORAL | Status: DC | PRN
  Administered 2022-05-08 – 2022-05-09 (×4): 5 mg via ORAL
  Filled 2022-05-08 (×4): qty 1

## 2022-05-08 MED ORDER — POTASSIUM CHLORIDE CRYS ER 10 MEQ PO TBCR
10.0000 meq | EXTENDED_RELEASE_TABLET | Freq: Two times a day (BID) | ORAL | Status: DC
  Administered 2022-05-08 – 2022-05-09 (×2): 10 meq via ORAL
  Filled 2022-05-08 (×2): qty 1

## 2022-05-08 NOTE — Assessment & Plan Note (Signed)
Replete and trend 

## 2022-05-08 NOTE — Assessment & Plan Note (Signed)
Serum creatinine 2.0 today improving slowly.   Baseline seems to be 1.6 Avoid nephrotoxic agents Trend

## 2022-05-08 NOTE — Plan of Care (Signed)
°  Problem: Clinical Measurements: °Goal: Diagnostic test results will improve °Outcome: Progressing °  °Problem: Clinical Measurements: °Goal: Respiratory complications will improve °Outcome: Progressing °  °Problem: Activity: °Goal: Risk for activity intolerance will decrease °Outcome: Progressing °  °Problem: Nutrition: °Goal: Adequate nutrition will be maintained °Outcome: Progressing °  °

## 2022-05-08 NOTE — Assessment & Plan Note (Signed)
Continue atorvastatin

## 2022-05-08 NOTE — Assessment & Plan Note (Addendum)
Likely related to UTI improved with antibiotics, and antiemetics. Had negative CT in 10 to, KUB is negative, right upper quadrant ultrasound from April 2023 was without evidence of gallstones. Her lipase is somewhat elevated without any epigastric pain.

## 2022-05-08 NOTE — Assessment & Plan Note (Signed)
'  s multiple UTIs last was E. coli this 1 seems to be growing Enterococcus.  Adjust antibiotics

## 2022-05-08 NOTE — Evaluation (Signed)
Occupational Therapy Evaluation Patient Details Name: Tamara Friedman MRN: [id number] DOB: [birth year] Today's Date: 05/08/2022   History of Present Illness [name] is a 71 yo female with who presented to the [facility] ED 8/12 with N/V.  After developing nausea and vomiting she then had some abdominal pain complaints, BP in ED 215/114. Admitted 12/27/2021 with similar complaints and symptoms.   PMH: HLD, HTN, CKD4,   Clinical Impression   Tamara Friedman is a 71 year old woman who presents with pain and generalized weakness. At baseline patient is independent with ADLs, can walk in a grocery store and ambulates with a walker. She reports her three grandsons live with her and someone is usually always there. Today patient significantly limited by pain in her low back. She required mod assist to power up from recliner and min guard to ambulate short distance in room. She exhibited short shuffling steps with flexed knees and a flexed posture. She was returned to recliner for comfort. She reports ambulating with nursing to bathroom for toileting. She is unable to reach lower legs due to pain thus requiring increased assistance for ADLs. Patient will benefit from skilled OT services while in hospital to improve deficits and learn compensatory strategies as needed in order to return to PLOF.  Expect once pain better controlled patient will improve and be able to discharge home with [facility] services.      Recommendations for follow up therapy are one component of a multi-disciplinary discharge planning process, led by the attending physician.  Recommendations may be updated based on patient status, additional functional criteria and insurance authorization.   Follow Up Recommendations  Home health OT    Assistance Recommended at Discharge Frequent or constant Supervision/Assistance  Patient can return home with the following A little help with walking and/or transfers;Assistance with cooking/housework;A lot of help  with bathing/dressing/bathroom;Assist for transportation;Help with stairs or ramp for entrance    Functional Status Assessment  Patient has had a recent decline in their functional status and demonstrates the ability to make significant improvements in function in a reasonable and predictable amount of time.  Equipment Recommendations  BSC/3in1    Recommendations for Other Services       Precautions / Restrictions Precautions Precautions: Fall Restrictions Weight Bearing Restrictions: No      Mobility Bed Mobility Overal bed mobility: Needs Assistance             General bed mobility comments: up in chair    Transfers Overall transfer level: Needs assistance Equipment used: Rolling walker (2 wheels) Transfers: Sit to/from Stand Sit to Stand: Mod assist           General transfer comment: Mod assist to power up from recliner height - very slow to rise. Min guard to ambulate short distance with flexed posture and use of walker. Patient limited by pain and demonstrates shuffling steps.      Balance Overall balance assessment: Needs assistance Sitting-balance support: No upper extremity supported, Feet supported Sitting balance-Leahy Scale: Good     Standing balance support: During functional activity, Reliant on assistive device for balance                               ADL either performed or assessed with clinical judgement   ADL Overall ADL's : Needs assistance/impaired Eating/Feeding: Independent   Grooming: Min guard;Standing;Wash/dry hands Grooming Details (indicate cue type and reason): decreased activity tolerance to stand  too long Upper Body Bathing: Set up   Lower Body Bathing: Set up;Moderate assistance;Sit to/from stand   Upper Body Dressing : Set up;Sitting   Lower Body Dressing: Moderate assistance;Sit to/from stand   Toilet Transfer: Min guard;Rolling walker (2 wheels);Comfort height toilet;Grab bars Toilet Transfer Details  (indicate cue type and reason): ambulating to bathroom with nursing Toileting- Clothing Manipulation and Hygiene: Minimal assistance;Sit to/from stand Toileting - Clothing Manipulation Details (indicate cue type and reason): min assist to manage hospital gown     Functional mobility during ADLs: Min guard;Rolling walker (2 wheels)       Vision Patient Visual Report: No change from baseline       Perception     Praxis      Pertinent Vitals/Pain Pain Assessment Pain Assessment: 0-10 Pain Score: 8  Pain Location: back Pain Descriptors / Indicators: Grimacing, Guarding Pain Intervention(s): Monitored during session, RN gave pain meds during session     Hand Dominance Right   Extremity/Trunk Assessment Upper Extremity Assessment Upper Extremity Assessment: Overall WFL for tasks assessed   Lower Extremity Assessment Lower Extremity Assessment: Defer to PT evaluation   Cervical / Trunk Assessment Cervical / Trunk Assessment:  (difficulty extending trunk due to pain)   Communication Communication Communication: No difficulties   Cognition Arousal/Alertness: Awake/alert Behavior During Therapy: WFL for tasks assessed/performed Overall Cognitive Status: Within Functional Limits for tasks assessed                                       General Comments       Exercises     Shoulder Instructions      Home Living Family/patient expects to be discharged to:: Private residence Living Arrangements:  (3 grandsons) Available Help at Discharge: Family;Available 24 hours/day Type of Home: Apartment Home Access: Stairs to enter Entrance Stairs-Number of Steps: 1 Entrance Stairs-Rails: None Home Layout: One level     Bathroom Shower/Tub: Teacher, early years/pre: Standard     Home Equipment: Conservation officer, nature (2 wheels);Cane - single point          Prior Functioning/Environment Prior Level of Function : Independent/Modified Independent              Mobility Comments: does not drive, grandson assists with groceries and transportation, reports using RW ADLs Comments: independent with ADLs, can walk in grocery store pushing buggy        OT Problem List: Decreased strength;Decreased activity tolerance;Impaired balance (sitting and/or standing);Decreased knowledge of use of DME or AE;Pain;Obesity      OT Treatment/Interventions: Self-care/ADL training;DME and/or AE instruction;Therapeutic activities;Patient/family education;Balance training;Therapeutic exercise    OT Goals(Current goals can be found in the care plan section) Acute Rehab OT Goals Patient Stated Goal: return to independence OT Goal Formulation: With patient Time For Goal Achievement: 05/22/22 Potential to Achieve Goals: Good  OT Frequency: Min 2X/week    Co-evaluation              AM-PAC OT "6 Clicks" Daily Activity     Outcome Measure Help from another person eating meals?: None Help from another person taking care of personal grooming?: A Little Help from another person toileting, which includes using toliet, bedpan, or urinal?: A Little Help from another person bathing (including washing, rinsing, drying)?: A Lot Help from another person to put on and taking off regular upper body clothing?: A Little Help from another person  to put on and taking off regular lower body clothing?: A Lot 6 Click Score: 17   End of Session Equipment Utilized During Treatment: Rolling walker (2 wheels) Nurse Communication: Mobility status  Activity Tolerance: Patient limited by pain Patient left: in chair;with call bell/phone within reach;with chair alarm set;with nursing/sitter in room  OT Visit Diagnosis: Pain;Muscle weakness (generalized) (M62.81)                Time: 8483-5075 OT Time Calculation (min): 15 min Charges:  OT General Charges $OT Visit: 1 Visit OT Evaluation $OT Eval Low Complexity: 1 Low  Gustavo Lah, OTR/L [place]   Office [phone]   Lenward Chancellor 05/08/2022, 1:38 PM

## 2022-05-08 NOTE — Hospital Course (Signed)
Patient with PMH of HTN, HLD presenting with intractable nausea vomiting and chills.  She reports left flank pain x1 day.  UA concerning for UTI.  Patient is started on IV ceftriaxone and urine culture showing Enterococcus.Marland Kitchen

## 2022-05-08 NOTE — Progress Notes (Addendum)
  Progress Note   Patient: Tamara Friedman OIN:[id number] DOB: 1951/02/13 DOA: 05/06/2022     1 DOS: the patient was seen and examined on 05/08/2022   Brief hospital course: Patient with PMH of HTN, HLD presenting with intractable nausea vomiting and chills.  She reports left flank pain x1 day.  UA concerning for UTI.  Patient is started on IV ceftriaxone and urine culture showing Enterococcus..  Assessment and Plan: * Acute cystitis 's multiple UTIs last was E. coli this 1 seems to be growing Enterococcus.  Adjust antibiotics  Hypokalemia Replete and trend  Abdominal pain Seems to be more consistent with flank pain today. Add Flexeril  Generalized weakness PT eval completed they recommend home PT  Dyslipidemia Continue atorvastatin  HTN (hypertension) Amlodipine Coreg  Acute renal failure superimposed on stage 4 chronic kidney disease (HCC) Serum creatinine 2.0 today improving slowly.   Baseline seems to be 1.6 Avoid nephrotoxic agents Trend  Intractable vomiting with nausea Likely related to UTI improved with antibiotics, and antiemetics. Had negative CT in 10 to, KUB is negative, right upper quadrant ultrasound from April 2023 was without evidence of gallstones. Her lipase is somewhat elevated without any epigastric pain.     Subjective: Patient reports continued pain.  She does not really think she is improved at all.  Physical Exam: Vitals:   05/07/22 1643 05/07/22 2034 05/08/22 0143 05/08/22 0616  BP:  132/63 131/70 (!) 143/82  Pulse:  68 (!) 57 62  Resp:  '18 16 18  '$ Temp:  98.5 F (36.9 C) 97.8 F (36.6 C) 98 F (36.7 C)  TempSrc:  Oral Oral Oral  SpO2:  100% 99% 100%  Weight: 72.6 kg     Height: '5\' 6"'$  (1.676 m)      Physical Examination: General appearance - alert, well appearing, and in no distress Mental status - alert, oriented to person, place, and time Chest - clear to auscultation, no wheezes, rales or rhonchi, symmetric air entry Heart - normal  rate, regular rhythm, normal S1, S2, no murmurs, rubs, clicks or gallops Abdomen - soft, nontender, nondistended, no masses or organomegaly Back -left CVA tenderness Extremities - peripheral pulses normal, no pedal edema, no clubbing or cyanosis  Data Reviewed: Results for orders placed or performed during the hospital encounter of 05/06/22 (from the past 24 hour(s))  Lactic acid, plasma     Status: None   Collection Time: 05/07/22  4:53 PM  Result Value Ref Range   Lactic Acid, Venous 1.0 0.5 - 1.9 mmol/L    Family Communication: Patient at bedside  Disposition: Status is: Inpatient Remains inpatient appropriate because: Ongoing acute kidney injury, IV antibiotic needs  Planned Discharge Destination: Home with Home Health  Time spent: 26 minutes  Author: Donnamae Jude, MD 05/08/2022 11:16 AM  For on call review [email or web address].

## 2022-05-08 NOTE — Evaluation (Signed)
Physical Therapy Evaluation Patient Details Name: Tamara Friedman MRN: [id number] DOB: 1951/04/13 Today's Date: 05/08/2022  History of Present Illness  72 yo female admitted with N/V, acute cystitis. PMH:HTN, CKD4  Clinical Impression  On eval, pt required Min A for mobility. She walked ~15 feet x 2 with a RW. Pt presents with general weakness, decreased activity tolerance, and impaired gait and balance. Pt rated pain 8/10 during session and requested assistance to return to bed. No family present during session. Will plan to follow pt during this hospital stay.        Recommendations for follow up therapy are one component of a multi-disciplinary discharge planning process, led by the attending physician.  Recommendations may be updated based on patient status, additional functional criteria and insurance authorization.  Follow Up Recommendations Home health PT      Assistance Recommended at Discharge Frequent or constant Supervision/Assistance  Patient can return home with the following  A little help with walking and/or transfers;A little help with bathing/dressing/bathroom;Assistance with cooking/housework;Assist for transportation;Help with stairs or ramp for entrance    Equipment Recommendations None recommended by PT  Recommendations for Other Services  OT consult    Functional Status Assessment Patient has had a recent decline in their functional status and demonstrates the ability to make significant improvements in function in a reasonable and predictable amount of time.     Precautions / Restrictions Precautions Precautions: Fall Restrictions Weight Bearing Restrictions: No      Mobility  Bed Mobility Overal bed mobility: Needs Assistance Bed Mobility: Sit to Supine       Sit to supine: Min assist   General bed mobility comments: Assist for LEs onto bed. Increased time.    Transfers Overall transfer level: Needs assistance Equipment used: Rolling walker (2  wheels) Transfers: Sit to/from Stand Sit to Stand: Min assist           General transfer comment: Min A to rise from recliner, toilet with armrests. Cues for safety, technique, hand placement. Increased time.    Ambulation/Gait Ambulation/Gait assistance: Min assist Gait Distance (Feet): 15 Feet (x2) Assistive device: Rolling walker (2 wheels) Gait Pattern/deviations: Step-through pattern, Decreased stride length, Trunk flexed, Shuffle       General Gait Details: Shuffle steps with forward flexed posture. Cues for safety, RW proximity. Assist to manage RW and steady pt intermittently.  Stairs            Wheelchair Mobility    Modified Rankin (Stroke Patients Only)       Balance Overall balance assessment: Needs assistance         Standing balance support: During functional activity, Reliant on assistive device for balance, Bilateral upper extremity supported Standing balance-Leahy Scale: Poor                               Pertinent Vitals/Pain Pain Assessment Pain Assessment: 0-10 Pain Score: 8  Pain Location: back Pain Descriptors / Indicators: Discomfort, Sore Pain Intervention(s): Repositioned, Limited activity within patient's tolerance, Monitored during session    Home Living Family/patient expects to be discharged to:: Private residence Living Arrangements:  (3 grandsons) Available Help at Discharge: Family;Available 24 hours/day Type of Home: Apartment Home Access: Stairs to enter Entrance Stairs-Rails: None Entrance Stairs-Number of Steps: 1   Home Layout: One level Home Equipment: Conservation officer, nature (2 wheels);Cane - single point      Prior Function Prior Level of Function : Independent/Modified  Independent             Mobility Comments: does not drive, grandson assists with groceries and transportation, reports using RW ADLs Comments: independent with ADLs, can walk in grocery store pushing buggy     Hand Dominance    Dominant Hand: Right    Extremity/Trunk Assessment   Upper Extremity Assessment Upper Extremity Assessment: Defer to OT evaluation    Lower Extremity Assessment Lower Extremity Assessment: Generalized weakness    Cervical / Trunk Assessment Cervical / Trunk Assessment:  (difficulty extending trunk due to pain)  Communication   Communication: No difficulties  Cognition Arousal/Alertness: Awake/alert (but drowsy) Behavior During Therapy: Flat affect Overall Cognitive Status: No family/caregiver present to determine baseline cognitive functioning                                 General Comments: drowsiness and flat affect could possibly be due to medications        General Comments      Exercises     Assessment/Plan    PT Assessment Patient needs continued PT services  PT Problem List Decreased strength;Decreased mobility;Decreased activity tolerance;Decreased balance;Decreased knowledge of use of DME;Pain;Decreased safety awareness;Decreased cognition       PT Treatment Interventions DME instruction;Gait training;Therapeutic activities;Therapeutic exercise;Patient/family education;Balance training;Functional mobility training    PT Goals (Current goals can be found in the Care Plan section)  Acute Rehab PT Goals Patient Stated Goal: none stated PT Goal Formulation: With patient Time For Goal Achievement: 05/22/22 Potential to Achieve Goals: Good    Frequency Min 3X/week     Co-evaluation               AM-PAC PT "6 Clicks" Mobility  Outcome Measure Help needed turning from your back to your side while in a flat bed without using bedrails?: A Little Help needed moving from lying on your back to sitting on the side of a flat bed without using bedrails?: A Lot Help needed moving to and from a bed to a chair (including a wheelchair)?: A Little Help needed standing up from a chair using your arms (e.g., wheelchair or bedside chair)?: A  Little Help needed to walk in hospital room?: A Little Help needed climbing 3-5 steps with a railing? : A Lot 6 Click Score: 16    End of Session   Activity Tolerance: Patient limited by fatigue;Patient limited by pain Patient left: in bed;with call bell/phone within reach;with bed alarm set   PT Visit Diagnosis: Muscle weakness (generalized) (M62.81);Difficulty in walking, not elsewhere classified (R26.2);Pain Pain - part of body:  (back)    Time: 9417-4081 PT Time Calculation (min) (ACUTE ONLY): 16 min   Charges:   PT Evaluation $PT Eval Moderate Complexity: 1 Mod             Doreatha Massed, PT Acute Rehabilitation  Office: [phone] Pager: [phone]

## 2022-05-08 NOTE — Assessment & Plan Note (Signed)
Seems to be more consistent with flank pain today. Add Flexeril

## 2022-05-08 NOTE — Assessment & Plan Note (Signed)
PT eval completed they recommend home PT

## 2022-05-08 NOTE — Assessment & Plan Note (Addendum)
Amlodipine Coreg

## 2022-05-09 ENCOUNTER — Telehealth (HOSPITAL_COMMUNITY): Payer: Self-pay | Admitting: Pharmacy Technician

## 2022-05-09 ENCOUNTER — Other Ambulatory Visit (HOSPITAL_COMMUNITY): Payer: Self-pay

## 2022-05-09 LAB — COMPREHENSIVE METABOLIC PANEL
ALT: 8 U/L (ref 0–44)
AST: 9 U/L — ABNORMAL LOW (ref 15–41)
Albumin: 2.5 g/dL — ABNORMAL LOW (ref 3.5–5.0)
Alkaline Phosphatase: 45 U/L (ref 38–126)
Anion gap: 5 (ref 5–15)
BUN: 23 mg/dL (ref 8–23)
CO2: 19 mmol/L — ABNORMAL LOW (ref 22–32)
Calcium: 8 mg/dL — ABNORMAL LOW (ref 8.9–10.3)
Chloride: 114 mmol/L — ABNORMAL HIGH (ref 98–111)
Creatinine, Ser: 1.78 mg/dL — ABNORMAL HIGH (ref 0.44–1.00)
GFR, Estimated: 30 mL/min — ABNORMAL LOW (ref >60)
Glucose, Bld: 92 mg/dL (ref 70–99)
Potassium: 5.1 mmol/L (ref 3.5–5.1)
Sodium: 138 mmol/L (ref 135–145)
Total Bilirubin: 0.3 mg/dL (ref 0.3–1.2)
Total Protein: 5.2 g/dL — ABNORMAL LOW (ref 6.5–8.1)

## 2022-05-09 LAB — CBC WITH DIFFERENTIAL/PLATELET
Abs Immature Granulocytes: 0.07 10*3/uL (ref 0.00–0.07)
Basophils Absolute: 0 10*3/uL (ref 0.0–0.1)
Basophils Relative: 1 %
Eosinophils Absolute: 1.1 10*3/uL — ABNORMAL HIGH (ref 0.0–0.5)
Eosinophils Relative: 15 %
HCT: 32.6 % — ABNORMAL LOW (ref 36.0–46.0)
Hemoglobin: 9.6 g/dL — ABNORMAL LOW (ref 12.0–15.0)
Immature Granulocytes: 1 %
Lymphocytes Relative: 29 %
Lymphs Abs: 2.1 10*3/uL (ref 0.7–4.0)
MCH: 25.8 pg — ABNORMAL LOW (ref 26.0–34.0)
MCHC: 29.4 g/dL — ABNORMAL LOW (ref 30.0–36.0)
MCV: 87.6 fL (ref 80.0–100.0)
Monocytes Absolute: 0.7 10*3/uL (ref 0.1–1.0)
Monocytes Relative: 10 %
Neutro Abs: 3.3 10*3/uL (ref 1.7–7.7)
Neutrophils Relative %: 44 %
Platelets: 230 10*3/uL (ref 150–400)
RBC: 3.72 MIL/uL — ABNORMAL LOW (ref 3.87–5.11)
RDW: 16.7 % — ABNORMAL HIGH (ref 11.5–15.5)
WBC: 7.3 10*3/uL (ref 4.0–10.5)
nRBC: 0 % (ref 0.0–0.2)

## 2022-05-09 LAB — LIPASE, BLOOD: Lipase: 76 U/L — ABNORMAL HIGH (ref 11–51)

## 2022-05-09 LAB — URINE CULTURE: Culture: >=100000 — AB

## 2022-05-09 MED ORDER — SODIUM CHLORIDE 0.9 % IV SOLN: INTRAVENOUS | Status: DC

## 2022-05-09 MED ORDER — LINEZOLID 600 MG PO TABS
600.0000 mg | ORAL_TABLET | Freq: Two times a day (BID) | ORAL | Status: DC
  Administered 2022-05-09 – 2022-05-12 (×7): 600 mg via ORAL
  Filled 2022-05-09 (×7): qty 1

## 2022-05-09 NOTE — TOC Benefit Eligibility Note (Signed)
Patient Teacher, English as a foreign language completed.    The patient is currently admitted and upon discharge could be taking linezolid (Zyvox) 600 mg tablets.  The current 10 day co-pay is $4.15.   The patient is insured through [employer] [place] Patient Advocate Specialist [place] Patient Advocate Team Direct Number: [phone]  Fax: [phone]

## 2022-05-09 NOTE — Plan of Care (Signed)
  Problem: Education: Goal: Knowledge of General Education information will improve Description Including pain rating scale, medication(s)/side effects and non-pharmacologic comfort measures Outcome: Progressing   

## 2022-05-09 NOTE — Telephone Encounter (Signed)
Pharmacy Patient Advocate Encounter  Insurance verification completed.    The patient is insured through [employer] Medicare Part D   The patient is currently admitted and ran test claims for the following: linezolid (Zyvox) .  Copays and coinsurance results were relayed to Inpatient clinical team.  

## 2022-05-09 NOTE — Progress Notes (Signed)
PROGRESS NOTE  Tamara Friedman FAO:[id number] DOB: [birth year] DOA: 05/06/2022 PCP: Lucianne Lei, MD   LOS: 2 days   Brief Narrative / Interim history: 71 year old female with HTN, HLD comes in with nausea, vomiting, chills and left flank pain for the past day.  Initial evaluation was concerning for UTI, CT abdomen and pelvis was without acute findings.  She was sent home from the ED on p.o. Keflex, but was unable to keep down so came back to the hospital.  She was placed on ceftriaxone, however cultures showing VRE  Subjective / 24h Interval events: Still complains of flank pain, feels nauseous but no vomiting.  Continues to feel overall very poorly.  Assesement and Plan: Principal Problem:   Acute cystitis Active Problems:   Intractable vomiting with nausea   Acute renal failure superimposed on stage 4 chronic kidney disease (HCC)   HTN (hypertension)   Dyslipidemia   Generalized weakness   Abdominal pain   Metabolic acidosis   Hypokalemia   Principal problem Intractable nausea, vomiting, abdominal pain as well as left flank pain-due to VRE UTI and concern for pyelonephritis.  Based on sensitivities this morning ceftriaxone will be discontinued, she was placed on linezolid.  Supportive care, she still feels poorly today  Active problems Acute kidney injury on chronic kidney disease stage IV-baseline creatinine around 1.6-1.7, and was as high as 2.5 on admission.  Improving today.  Continue fluids  Metabolic acidosis-due to dehydration, acute kidney injury.  Monitor  Essential hypertension-blood pressure varies, continue amlodipine, Coreg  Generalized weakness-PT/OT recommends home health PT  Scheduled Meds:  amLODipine  10 mg Oral Daily   carvedilol  25 mg Oral BID WC   heparin  5,000 Units Subcutaneous Q8H   linezolid  600 mg Oral Q12H   ondansetron  4 mg Oral Once   Continuous Infusions:  sodium chloride 125 mL/hr at 05/09/22 0959   PRN Meds:.acetaminophen **OR**  acetaminophen, cyclobenzaprine, HYDROmorphone (DILAUDID) injection, ondansetron (ZOFRAN) IV, oxyCODONE, senna-docusate  Current Outpatient Medications  Medication Instructions   albuterol (VENTOLIN HFA) 108 (90 Base) MCG/ACT inhaler 1-2 puffs, Inhalation, Every 6 hours PRN   amLODipine (NORVASC) 10 mg, Oral, Daily   aspirin 81 mg, Oral, Daily   atorvastatin (LIPITOR) 10 mg, Oral, Daily   carvedilol (COREG) 25 mg, Oral, 2 times daily with meals   cloNIDine (CATAPRES) 0.2 mg, Oral, 3 times daily   magnesium hydroxide (MILK OF MAGNESIA) 400 MG/5ML suspension 30 mLs, Oral, Daily PRN   MELATONIN PO 1 tablet, Oral, Daily at bedtime   Multiple Vitamins-Minerals (MULTIVITAMIN ADULTS) TABS 1 tablet, Oral, Daily after supper   ondansetron (ZOFRAN-ODT) 8 mg, Oral, Every 8 hours PRN   oxyCODONE (OXY IR/ROXICODONE) 5 mg, Oral, Every 6 hours PRN   polyethylene glycol (MIRALAX / GLYCOLAX) 17 g, Oral, Daily    Diet Orders (From admission, onward)     Start     Ordered   05/07/22 0555  Diet heart healthy/carb modified Room service appropriate? Yes; Fluid consistency: Thin  Diet effective now       Question Answer Comment  Diet-HS Snack? Nothing   Room service appropriate? Yes   Fluid consistency: Thin      05/07/22 0556            DVT prophylaxis: heparin injection 5,000 Units Start: 05/07/22 0600 SCDs Start: 05/07/22 0555   Lab Results  Component Value Date   PLT 230 05/09/2022      Code Status: Full Code  Family Communication: No  family at bedside  Status is: Inpatient Remains inpatient appropriate because: Persistent symptoms   Level of care: Med-Surg  Consultants:  none  Objective: Vitals:   05/08/22 1314 05/08/22 2100 05/09/22 0504 05/09/22 0747  BP: 118/77 125/71 139/69 (!) 153/80  Pulse: (!) 58 65 75 69  Resp: '16 18 18 17  '$ Temp: 98 F (36.7 C) 98 F (36.7 C) 98.9 F (37.2 C) 99.2 F (37.3 C)  TempSrc:  Oral Oral Oral  SpO2: 100% 99% 100% 99%  Weight:       Height:        Intake/Output Summary (Last 24 hours) at 05/09/2022 1253 Last data filed at 05/09/2022 0959 Gross per 24 hour  Intake 4657 ml  Output --  Net 4657 ml   Wt Readings from Last 3 Encounters:  05/07/22 72.6 kg  03/12/22 72.6 kg  12/26/21 71.6 kg    Examination:  Constitutional: NAD Eyes: no scleral icterus ENMT: Mucous membranes are moist.  Neck: normal, supple Respiratory: clear to auscultation bilaterally, no wheezing, no crackles. Normal respiratory effort. No accessory muscle use.  Cardiovascular: Regular rate and rhythm, no murmurs / rubs / gallops. No LE edema.  Abdomen: Left upper quadrant tenderness, CVA tenderness on left also Musculoskeletal: no clubbing / cyanosis.  Skin: no rashes Neurologic: non focal   Data Reviewed: I have independently reviewed following labs and imaging studies   CBC Recent Labs  Lab 05/06/22 1827 05/07/22 0707 05/09/22 0335  WBC 9.1 9.9 7.3  HGB 12.1 11.8* 9.6*  HCT 40.2 38.2 32.6*  PLT 311 285 230  MCV 84.6 83.4 87.6  MCH 25.5* 25.8* 25.8*  MCHC 30.1 30.9 29.4*  RDW 16.4* 16.6* 16.7*  LYMPHSABS 2.5  --  2.1  MONOABS 0.8  --  0.7  EOSABS 1.0*  --  1.1*  BASOSABS 0.1  --  0.0    Recent Labs  Lab 05/06/22 1827 05/07/22 0707 05/07/22 1222 05/07/22 1653 05/09/22 0335  NA 137  --  141  --  138  K 3.8  --  3.4*  --  5.1  CL 111  --  115*  --  114*  CO2 16*  --  18*  --  19*  GLUCOSE 103*  --  127*  --  92  BUN 50*  --  43*  --  23  CREATININE 2.50* 2.40* 2.03*  --  1.78*  CALCIUM 9.1  --  8.0*  --  8.0*  AST 11*  --  10*  --  9*  ALT 13  --  10  --  8  ALKPHOS 69  --  58  --  45  BILITOT 0.6  --  0.6  --  0.3  ALBUMIN 3.8  --  3.1*  --  2.5*  MG  --   --  2.3  --   --   LATICACIDVEN  --   --  1.2 1.0  --     ------------------------------------------------------------------------------------------------------------------ No results for input(s): "CHOL", "HDL", "LDLCALC", "TRIG", "CHOLHDL", "LDLDIRECT"  in the last 72 hours.  No results found for: "HGBA1C" ------------------------------------------------------------------------------------------------------------------ No results for input(s): "TSH", "T4TOTAL", "T3FREE", "THYROIDAB" in the last 72 hours.  Invalid input(s): "FREET3"  Cardiac Enzymes No results for input(s): "CKMB", "TROPONINI", "MYOGLOBIN" in the last 168 hours.  Invalid input(s): "CK" ------------------------------------------------------------------------------------------------------------------    Component Value Date/Time   BNP 93.6 05/02/2022 1138    CBG: No results for input(s): "GLUCAP" in the last 168 hours.  Recent Results (from the  past 240 hour(s))  Urine Culture     Status: Abnormal   Collection Time: 05/07/22  4:21 AM   Specimen: In/Out Cath Urine  Result Value Ref Range Status   Specimen Description   Final    IN/OUT CATH URINE Performed at [facility], [place] [address] [place], [place] [address]    Special Requests   Final    NONE Performed at [facility], [place] [address] [place], [place] [address]    Culture (A)  Final    >=100,000 COLONIES/mL ENTEROCOCCUS FAECIUM VANCOMYCIN RESISTANT ENTEROCOCCUS ISOLATED >=100,000 COLONIES/mL DIPHTHEROIDS(CORYNEBACTERIUM SPECIES) Standardized susceptibility testing for this organism is not available. Performed at [facility], [place] [address], [place], [place] [address]    Report Status 05/09/2022 FINAL  Final   Organism ID, Bacteria ENTEROCOCCUS FAECIUM (A)  Final      Susceptibility   Enterococcus faecium - MIC*    AMPICILLIN >=32 RESISTANT Resistant     NITROFURANTOIN 64 INTERMEDIATE Intermediate     VANCOMYCIN >=32 RESISTANT Resistant     LINEZOLID 2 SENSITIVE Sensitive     * >=100,000 COLONIES/mL ENTEROCOCCUS FAECIUM     Radiology Studies: No results found.   Marzetta Board, MD, PhD [facility]  Between 7 am - 7 pm I am available,  please contact me via Amion (for emergencies) or Securechat (non urgent messages)  Between 7 pm - 7 am I am not available, please contact night coverage MD/APP via Amion

## 2022-05-09 NOTE — Plan of Care (Signed)
Plan of care reviewed and discussed. °

## 2022-05-09 NOTE — Plan of Care (Signed)

## 2022-05-10 ENCOUNTER — Telehealth: Payer: Self-pay

## 2022-05-10 LAB — CBC
HCT: 31.7 % — ABNORMAL LOW (ref 36.0–46.0)
Hemoglobin: 9.1 g/dL — ABNORMAL LOW (ref 12.0–15.0)
MCH: 25.7 pg — ABNORMAL LOW (ref 26.0–34.0)
MCHC: 28.7 g/dL — ABNORMAL LOW (ref 30.0–36.0)
MCV: 89.5 fL (ref 80.0–100.0)
Platelets: 221 10*3/uL (ref 150–400)
RBC: 3.54 MIL/uL — ABNORMAL LOW (ref 3.87–5.11)
RDW: 17.2 % — ABNORMAL HIGH (ref 11.5–15.5)
WBC: 8.3 10*3/uL (ref 4.0–10.5)
nRBC: 0 % (ref 0.0–0.2)

## 2022-05-10 LAB — COMPREHENSIVE METABOLIC PANEL
ALT: 8 U/L (ref 0–44)
AST: 8 U/L — ABNORMAL LOW (ref 15–41)
Albumin: 2.4 g/dL — ABNORMAL LOW (ref 3.5–5.0)
Alkaline Phosphatase: 45 U/L (ref 38–126)
Anion gap: 3 — ABNORMAL LOW (ref 5–15)
BUN: 17 mg/dL (ref 8–23)
CO2: 19 mmol/L — ABNORMAL LOW (ref 22–32)
Calcium: 8.2 mg/dL — ABNORMAL LOW (ref 8.9–10.3)
Chloride: 113 mmol/L — ABNORMAL HIGH (ref 98–111)
Creatinine, Ser: 1.53 mg/dL — ABNORMAL HIGH (ref 0.44–1.00)
GFR, Estimated: 36 mL/min — ABNORMAL LOW (ref >60)
Glucose, Bld: 83 mg/dL (ref 70–99)
Potassium: 4.9 mmol/L (ref 3.5–5.1)
Sodium: 135 mmol/L (ref 135–145)
Total Bilirubin: 0.3 mg/dL (ref 0.3–1.2)
Total Protein: 5.1 g/dL — ABNORMAL LOW (ref 6.5–8.1)

## 2022-05-10 LAB — MAGNESIUM: Magnesium: 1.7 mg/dL (ref 1.7–2.4)

## 2022-05-10 NOTE — Progress Notes (Signed)
Occupational Therapy Treatment Patient Details Name: Tamara Friedman MRN: [id number] DOB: 10-02-1950 Today's Date: 05/10/2022   History of present illness Patient is a 71 year old female who was admitted with N/V, acute cystitis. PMH:HTN, CKD4   OT comments  Patient was able to engage in bathing/dressing tasks sitting EOB with min A for sit to stand from edge of bed with noted flexed posture in standing. Patient reported increased pain in back but was premedicated and still motivated to participate in session. Patient would continue to benefit from skilled OT services at this time while admitted and after d/c to address noted deficits in order to improve overall safety and independence in ADLs.     Recommendations for follow up therapy are one component of a multi-disciplinary discharge planning process, led by the attending physician.  Recommendations may be updated based on patient status, additional functional criteria and insurance authorization.    Follow Up Recommendations  Home health OT    Assistance Recommended at Discharge Frequent or constant Supervision/Assistance  Patient can return home with the following  A little help with walking and/or transfers;Assistance with cooking/housework;A lot of help with bathing/dressing/bathroom;Assist for transportation;Help with stairs or ramp for entrance   Equipment Recommendations  BSC/3in1    Recommendations for Other Services      Precautions / Restrictions Precautions Precautions: Fall Precaution Comments: pain from recent fall Restrictions Weight Bearing Restrictions: No       Mobility Bed Mobility Overal bed mobility: Needs Assistance Bed Mobility: Supine to Sit, Sit to Supine     Supine to sit: Supervision Sit to supine: Supervision        Transfers                         Balance           Standing balance support: During functional activity, Reliant on assistive device for balance, Bilateral  upper extremity supported Standing balance-Leahy Scale: Poor                             ADL either performed or assessed with clinical judgement   ADL Overall ADL's : Needs assistance/impaired         Upper Body Bathing: Sitting;Set up   Lower Body Bathing: Sit to/from stand;Minimal assistance;Min guard Lower Body Bathing Details (indicate cue type and reason): patient needed min A for sit to stand with flexed posture noted in standing to reach perineal area. when wiping perineal area with wash cloth patient was noted to have slight tinge of red on wash cloth. patient became worried that she was bleeding but when provided with fresh wipe was unable to find any more of redness. patient requested that nursing come in. nurse was called into room with nurse checking area and unable to find any bleeding. patient was educated to keep an eye on it when she voided later to see if any changes were present. patient verbalized understanding. no discharge noted in previous pair of undergarments either. Upper Body Dressing : Set up;Sitting   Lower Body Dressing: Min guard;Minimal assistance;Sit to/from stand Lower Body Dressing Details (indicate cue type and reason): to don new pair of underwear.                    Extremity/Trunk Assessment              Vision       Perception  Praxis      Cognition Arousal/Alertness: Awake/alert Behavior During Therapy: Flat affect Overall Cognitive Status: Within Functional Limits for tasks assessed                                 General Comments: patient was cooperative with session.        Exercises      Shoulder Instructions       General Comments      Pertinent Vitals/ Pain       Pain Assessment Pain Assessment: Faces Faces Pain Scale: Hurts little more Pain Location: bilateral flanks Pain Descriptors / Indicators: Discomfort Pain Intervention(s): Monitored during session, Limited activity  within patient's tolerance  Home Living                                          Prior Functioning/Environment              Frequency  Min 2X/week        Progress Toward Goals  OT Goals(current goals can now be found in the care plan section)  Progress towards OT goals: Progressing toward goals     Plan Discharge plan remains appropriate    Co-evaluation                 AM-PAC OT "6 Clicks" Daily Activity     Outcome Measure   Help from another person eating meals?: None Help from another person taking care of personal grooming?: A Little Help from another person toileting, which includes using toliet, bedpan, or urinal?: A Little Help from another person bathing (including washing, rinsing, drying)?: A Lot Help from another person to put on and taking off regular upper body clothing?: A Little Help from another person to put on and taking off regular lower body clothing?: A Lot 6 Click Score: 17    End of Session    OT Visit Diagnosis: Pain;Muscle weakness (generalized) (M62.81)   Activity Tolerance Patient limited by pain   Patient Left in bed;with call bell/phone within reach;with bed alarm set   Nurse Communication Other (comment) (patients concerns when wiping perineal area.)        Time: 9179-1505 OT Time Calculation (min): 18 min  Charges: OT General Charges $OT Visit: 1 Visit OT Treatments $Self Care/Home Management : 8-22 mins  Tamara Plowman, MS Acute Rehabilitation Department Office# [phone]   Tamara Friedman 05/10/2022, 4:09 PM

## 2022-05-10 NOTE — Care Management Important Message (Signed)
Important Message  Patient Details IM Letter given. Name: Tamara Friedman MRN: [id number] Date of Birth: 09-21-1950   Medicare Important Message Given:  Yes     Kerin Salen 05/10/2022, 11:09 AM

## 2022-05-10 NOTE — Telephone Encounter (Signed)
     Patient  visit on 05/02/2022  at [facility] you been able to follow up with your primary care physician? - N/A  The patient was or was not able to obtain any needed medicine or equipment.- N/A  Are there diet recommendations that you are having difficulty following? - N/A  Patient is currently admitted from 05/06/2022-present.   [facility] management  [place], [place]  Main Phone: [phone]  E-mail: [email or web address]  Website: [email or web address]

## 2022-05-10 NOTE — Plan of Care (Signed)
  Problem: Health Behavior/Discharge Planning: Goal: Ability to manage health-related needs will improve Outcome: Progressing   Problem: Activity: Goal: Risk for activity intolerance will decrease Outcome: Progressing   Problem: Pain Managment: Goal: General experience of comfort will improve Outcome: Progressing   

## 2022-05-10 NOTE — Progress Notes (Signed)
Physical Therapy Treatment Patient Details Name: Tamara Friedman MRN: [id number] DOB: [birth year] Today's Date: 05/10/2022   History of Present Illness 71 yo female admitted with N/V, acute cystitis. PMH:HTN, CKD4    PT Comments    General Comments: AxO x 3 quiet [place], [place]. Assisted OOB to amb in hallway required increased time.  General bed mobility comments: self able with increased time due to back pain from recent fall. General transfer comment: Min/MinGuard Asisst to rise from bed with increased effort and noticable struggle.  Heavy use of B UE's to steady self.  Increased c/o back pain with activity. General Gait Details: limited distance due to increased back pain with activity and required a seated rest break after 16 feet.  Also c/o max fatigue.  Unsteady gait with poor forward flex posture and shuffled steps. Amb back to room and left pt sitting EOB with Family in room.  Pt requesting something for pain - reported to RN.  Pt plans to return home.  Recommendations for follow up therapy are one component of a multi-disciplinary discharge planning process, led by the attending physician.  Recommendations may be updated based on patient status, additional functional criteria and insurance authorization.  Follow Up Recommendations  Home health PT     Assistance Recommended at Discharge Frequent or constant Supervision/Assistance  Patient can return home with the following A little help with walking and/or transfers;A little help with bathing/dressing/bathroom;Assistance with cooking/housework;Assist for transportation;Help with stairs or ramp for entrance   Equipment Recommendations  None recommended by PT    Recommendations for Other Services       Precautions / Restrictions Precautions Precautions: Fall Precaution Comments: pain from recent fall Restrictions Weight Bearing Restrictions: No     Mobility  Bed Mobility Overal bed mobility: Needs Assistance Bed Mobility:  Supine to Sit     Supine to sit: Min guard, Supervision     General bed mobility comments: self able with increased time due to back pain from recent fall.    Transfers Overall transfer level: Needs assistance Equipment used: Rolling walker (2 wheels) Transfers: Sit to/from Stand Sit to Stand: Min guard, Min assist           General transfer comment: Min/MinGuard Asisst to rise from bed with increased effort and noticable struggle.  Heavy use of B UE's to steady self.  Increased c/o back pain with activity.    Ambulation/Gait Ambulation/Gait assistance: Min assist Gait Distance (Feet): 32 Feet (16 feet x 2 one seated rest break) Assistive device: Rolling walker (2 wheels) Gait Pattern/deviations: Step-through pattern, Decreased stride length, Trunk flexed, Shuffle Gait velocity: decreased     General Gait Details: limited distance due to increased back pain with activity and required a seated rest break after 16 feet.  Unsteady gait with poor forward flex posture and shuffled steps.   Stairs             Wheelchair Mobility    Modified Rankin (Stroke Patients Only)       Balance                                            Cognition Arousal/Alertness: Awake/alert Behavior During Therapy: Flat affect Overall Cognitive Status: Within Functional Limits for tasks assessed  General Comments: AxO x 3 quiet [place], [place].        Exercises      General Comments        Pertinent Vitals/Pain Pain Assessment Pain Assessment: Faces Faces Pain Scale: Hurts little more Pain Location: back and B thighs Pain Descriptors / Indicators: Discomfort, Sore, Grimacing Pain Intervention(s): Monitored during session, Premedicated before session, Repositioned    Home Living                          Prior Function            PT Goals (current goals can now be found in the care plan  section) Progress towards PT goals: Progressing toward goals    Frequency    Min 3X/week      PT Plan Current plan remains appropriate    Co-evaluation              AM-PAC PT "6 Clicks" Mobility   Outcome Measure  Help needed turning from your back to your side while in a flat bed without using bedrails?: A Little Help needed moving from lying on your back to sitting on the side of a flat bed without using bedrails?: A Little Help needed moving to and from a bed to a chair (including a wheelchair)?: A Little Help needed standing up from a chair using your arms (e.g., wheelchair or bedside chair)?: A Little Help needed to walk in hospital room?: A Little Help needed climbing 3-5 steps with a railing? : A Lot 6 Click Score: 17    End of Session Equipment Utilized During Treatment: Gait belt Activity Tolerance: Patient limited by fatigue;Patient limited by pain Patient left: in bed;with call bell/phone within reach;with family/visitor present (sitting on EOB with GrandSon in room) Nurse Communication: Mobility status PT Visit Diagnosis: Muscle weakness (generalized) (M62.81);Difficulty in walking, not elsewhere classified (R26.2);Pain     Time: 2202-5427 PT Time Calculation (min) (ACUTE ONLY): 15 min  Charges:  $Gait Training: 8-22 mins                     Rica Koyanagi  PTA [place] Office M-F          [phone] Weekend pager [phone]

## 2022-05-10 NOTE — Progress Notes (Signed)
PROGRESS NOTE  Tamara Friedman:[id number] DOB: [birth year] DOA: 05/06/2022 PCP: Lucianne Lei, MD   LOS: 3 days   Brief Narrative / Interim history: 71 year old female with HTN, HLD comes in with nausea, vomiting, chills and left flank pain for the past day.  Initial evaluation was concerning for UTI, CT abdomen and pelvis was without acute findings.  She was sent home from the ED on p.o. Keflex, but was unable to keep down so came back to the hospital.  She was placed on ceftriaxone, however cultures showing VRE  Subjective / 24h Interval events: Still complains of bilateral flank pain, denies any nausea or vomiting  Assesement and Plan: Principal Problem:   Acute cystitis Active Problems:   Intractable vomiting with nausea   Acute renal failure superimposed on stage 4 chronic kidney disease (HCC)   HTN (hypertension)   Dyslipidemia   Generalized weakness   Abdominal pain   Metabolic acidosis   Hypokalemia   Principal problem Intractable nausea, vomiting, abdominal pain as well as left flank pain-due to VRE UTI and clinical concern for pyelonephritis despite negative CT scan.  She is initially maintained on ceftriaxone but did not really improve, urine cultures eventually speciated VRE and she has been switched to linezolid yesterday.  Nausea and vomiting seems to be better this morning but still complains of significant flank pain.  Active problems Acute kidney injury on chronic kidney disease stage IV-baseline creatinine around 1.6-1.7, and was as high as 2.5 on admission.  Then has returned to her baseline.  Now appreciate some mild swelling in her legs, stop IV fluids  Metabolic acidosis-due to dehydration, acute kidney injury.  Stable  Essential hypertension-blood pressure varies but acceptable for most part, continue amlodipine, Coreg  Generalized weakness-PT/OT recommends home health PT  Scheduled Meds:  amLODipine  10 mg Oral Daily   carvedilol  25 mg Oral BID WC    heparin  5,000 Units Subcutaneous Q8H   linezolid  600 mg Oral Q12H   ondansetron  4 mg Oral Once   Continuous Infusions:   PRN Meds:.acetaminophen **OR** acetaminophen, cyclobenzaprine, HYDROmorphone (DILAUDID) injection, ondansetron (ZOFRAN) IV, oxyCODONE, senna-docusate  Current Outpatient Medications  Medication Instructions   albuterol (VENTOLIN HFA) 108 (90 Base) MCG/ACT inhaler 1-2 puffs, Inhalation, Every 6 hours PRN   amLODipine (NORVASC) 10 mg, Oral, Daily   aspirin 81 mg, Oral, Daily   atorvastatin (LIPITOR) 10 mg, Oral, Daily   carvedilol (COREG) 25 mg, Oral, 2 times daily with meals   cloNIDine (CATAPRES) 0.2 mg, Oral, 3 times daily   magnesium hydroxide (MILK OF MAGNESIA) 400 MG/5ML suspension 30 mLs, Oral, Daily PRN   MELATONIN PO 1 tablet, Oral, Daily at bedtime   Multiple Vitamins-Minerals (MULTIVITAMIN ADULTS) TABS 1 tablet, Oral, Daily after supper   ondansetron (ZOFRAN-ODT) 8 mg, Oral, Every 8 hours PRN   oxyCODONE (OXY IR/ROXICODONE) 5 mg, Oral, Every 6 hours PRN   polyethylene glycol (MIRALAX / GLYCOLAX) 17 g, Oral, Daily    Diet Orders (From admission, onward)     Start     Ordered   05/07/22 0555  Diet heart healthy/carb modified Room service appropriate? Yes; Fluid consistency: Thin  Diet effective now       Question Answer Comment  Diet-HS Snack? Nothing   Room service appropriate? Yes   Fluid consistency: Thin      05/07/22 0556            DVT prophylaxis: heparin injection 5,000 Units Start: 05/07/22 0600 SCDs Start:  05/07/22 0555   Lab Results  Component Value Date   PLT 221 05/10/2022      Code Status: Full Code  Family Communication: No family at bedside,   Status is: Inpatient Remains inpatient appropriate because: Persistent symptoms, likely home within 24 hours if her flank pain is better   Level of care: Med-Surg  Consultants:  none  Objective: Vitals:   05/09/22 1329 05/09/22 2128 05/10/22 0456 05/10/22 0923  BP:  139/89 (!) 149/75 (!) 149/82 137/70  Pulse: 73 73 73 76  Resp: '18 19 20 17  '$ Temp: 98.7 F (37.1 C) 99.1 F (37.3 C) 98.4 F (36.9 C) 98.7 F (37.1 C)  TempSrc:  Oral  Oral  SpO2: 100% 100% 100% 100%  Weight:      Height:        Intake/Output Summary (Last 24 hours) at 05/10/2022 1039 Last data filed at 05/10/2022 1029 Gross per 24 hour  Intake 3577.91 ml  Output --  Net 3577.91 ml    Wt Readings from Last 3 Encounters:  05/07/22 72.6 kg  03/12/22 72.6 kg  12/26/21 71.6 kg    Examination:  Constitutional: NAD Eyes: lids and conjunctivae normal, no scleral icterus ENMT: mmm Neck: normal, supple Respiratory: clear to auscultation bilaterally, no wheezing, no crackles. Normal respiratory effort.  Cardiovascular: Regular rate and rhythm, no murmurs / rubs / gallops. No LE edema. Abdomen: soft, no distention, no tenderness. Bowel sounds positive.  Skin: no rashes Neurologic: no focal deficits, equal strength   Data Reviewed: I have independently reviewed following labs and imaging studies   CBC Recent Labs  Lab 05/06/22 1827 05/07/22 0707 05/09/22 0335 05/10/22 0337  WBC 9.1 9.9 7.3 8.3  HGB 12.1 11.8* 9.6* 9.1*  HCT 40.2 38.2 32.6* 31.7*  PLT 311 285 230 221  MCV 84.6 83.4 87.6 89.5  MCH 25.5* 25.8* 25.8* 25.7*  MCHC 30.1 30.9 29.4* 28.7*  RDW 16.4* 16.6* 16.7* 17.2*  LYMPHSABS 2.5  --  2.1  --   MONOABS 0.8  --  0.7  --   EOSABS 1.0*  --  1.1*  --   BASOSABS 0.1  --  0.0  --      Recent Labs  Lab 05/06/22 1827 05/07/22 0707 05/07/22 1222 05/07/22 1653 05/09/22 0335 05/10/22 0337  NA 137  --  141  --  138 135  K 3.8  --  3.4*  --  5.1 4.9  CL 111  --  115*  --  114* 113*  CO2 16*  --  18*  --  19* 19*  GLUCOSE 103*  --  127*  --  92 83  BUN 50*  --  43*  --  23 17  CREATININE 2.50* 2.40* 2.03*  --  1.78* 1.53*  CALCIUM 9.1  --  8.0*  --  8.0* 8.2*  AST 11*  --  10*  --  9* 8*  ALT 13  --  10  --  8 8  ALKPHOS 69  --  58  --  45 45   BILITOT 0.6  --  0.6  --  0.3 0.3  ALBUMIN 3.8  --  3.1*  --  2.5* 2.4*  MG  --   --  2.3  --   --  1.7  LATICACIDVEN  --   --  1.2 1.0  --   --      ------------------------------------------------------------------------------------------------------------------ No results for input(s): "CHOL", "HDL", "LDLCALC", "TRIG", "CHOLHDL", "LDLDIRECT" in the last 72  hours.  No results found for: "HGBA1C" ------------------------------------------------------------------------------------------------------------------ No results for input(s): "TSH", "T4TOTAL", "T3FREE", "THYROIDAB" in the last 72 hours.  Invalid input(s): "FREET3"  Cardiac Enzymes No results for input(s): "CKMB", "TROPONINI", "MYOGLOBIN" in the last 168 hours.  Invalid input(s): "CK" ------------------------------------------------------------------------------------------------------------------    Component Value Date/Time   BNP 93.6 05/02/2022 1138    CBG: No results for input(s): "GLUCAP" in the last 168 hours.  Recent Results (from the past 240 hour(s))  Urine Culture     Status: Abnormal   Collection Time: 05/07/22  4:21 AM   Specimen: In/Out Cath Urine  Result Value Ref Range Status   Specimen Description   Final    IN/OUT CATH URINE Performed at [facility], [place] [address] [place], [place] [address]    Special Requests   Final    NONE Performed at [facility], [place] [address] [place], [place] [address]    Culture (A)  Final    >=100,000 COLONIES/mL ENTEROCOCCUS FAECIUM VANCOMYCIN RESISTANT ENTEROCOCCUS ISOLATED >=100,000 COLONIES/mL DIPHTHEROIDS(CORYNEBACTERIUM SPECIES) Standardized susceptibility testing for this organism is not available. Performed at [facility], [place] [address] [place], [place] [address]    Report Status 05/09/2022 FINAL  Final   Organism ID, Bacteria ENTEROCOCCUS FAECIUM (A)  Final      Susceptibility   Enterococcus faecium -  MIC*    AMPICILLIN >=32 RESISTANT Resistant     NITROFURANTOIN 64 INTERMEDIATE Intermediate     VANCOMYCIN >=32 RESISTANT Resistant     LINEZOLID 2 SENSITIVE Sensitive     * >=100,000 COLONIES/mL ENTEROCOCCUS FAECIUM     Radiology Studies: No results found.   Marzetta Board, MD, PhD [facility]  Between 7 am - 7 pm I am available, please contact me via Amion (for emergencies) or Securechat (non urgent messages)  Between 7 pm - 7 am I am not available, please contact night coverage MD/APP via Amion

## 2022-05-11 ENCOUNTER — Inpatient Hospital Stay (HOSPITAL_COMMUNITY): Payer: Medicare Other

## 2022-05-11 LAB — MAGNESIUM: Magnesium: 1.7 mg/dL (ref 1.7–2.4)

## 2022-05-11 LAB — BASIC METABOLIC PANEL
Anion gap: 5 (ref 5–15)
BUN: 22 mg/dL (ref 8–23)
CO2: 20 mmol/L — ABNORMAL LOW (ref 22–32)
Calcium: 8.7 mg/dL — ABNORMAL LOW (ref 8.9–10.3)
Chloride: 110 mmol/L (ref 98–111)
Creatinine, Ser: 1.52 mg/dL — ABNORMAL HIGH (ref 0.44–1.00)
GFR, Estimated: 36 mL/min — ABNORMAL LOW (ref >60)
Glucose, Bld: 92 mg/dL (ref 70–99)
Potassium: 4.9 mmol/L (ref 3.5–5.1)
Sodium: 135 mmol/L (ref 135–145)

## 2022-05-11 LAB — CBC
HCT: 31.2 % — ABNORMAL LOW (ref 36.0–46.0)
Hemoglobin: 9.3 g/dL — ABNORMAL LOW (ref 12.0–15.0)
MCH: 25.6 pg — ABNORMAL LOW (ref 26.0–34.0)
MCHC: 29.8 g/dL — ABNORMAL LOW (ref 30.0–36.0)
MCV: 86 fL (ref 80.0–100.0)
Platelets: 245 10*3/uL (ref 150–400)
RBC: 3.63 MIL/uL — ABNORMAL LOW (ref 3.87–5.11)
RDW: 17.2 % — ABNORMAL HIGH (ref 11.5–15.5)
WBC: 8.9 10*3/uL (ref 4.0–10.5)
nRBC: 0 % (ref 0.0–0.2)

## 2022-05-11 MED ORDER — TIZANIDINE HCL 4 MG PO TABS
4.0000 mg | ORAL_TABLET | Freq: Three times a day (TID) | ORAL | Status: DC
  Administered 2022-05-11 – 2022-05-12 (×3): 4 mg via ORAL
  Filled 2022-05-11 (×3): qty 1

## 2022-05-11 NOTE — TOC Transition Note (Signed)
Transition of Care [facility]) - CM/SW Discharge Note   Patient Details  Name: Tamara Friedman MRN: [id number] Date of Birth: Nov 27, 1950  Transition of Care [facility]) CM/SW Contact:  Lennart Pall, LCSW Phone Number: 05/11/2022, 2:01 PM   Clinical Narrative:    Met with pt today to discuss recommendations for HHPT/OT.  Pt is agreeable and without any agency preference.  Have placed referral with [facility] (had worked with pt in Aug 2023).  No further TOC needs.  Please reorder if needed.   Final next level of care: [employer] Barriers to Discharge: Continued Medical Work up   Patient Goals and CMS Choice Patient states their goals for this hospitalization and ongoing recovery are:: return home      Discharge Placement                       Discharge Plan and Services                DME Arranged: N/A DME Agency: NA       HH Arranged: OT, PT [place] Agency: [place] Date [place]: 05/11/22 Time [place]: 1401 Representative spoke with at [place]: [place] (SDOH) Interventions Food Insecurity Interventions: Intervention Not Indicated   Readmission Risk Interventions    05/11/2022    1:59 PM  Readmission Risk Prevention Plan  Transportation Screening Complete  PCP or Specialist Appt within 3-5 Days Complete  HRI or [place] Complete  Social Work Consult for Hall Planning/Counseling Complete  Palliative Care Screening Not Applicable  Medication Review [employer]) Complete

## 2022-05-11 NOTE — Progress Notes (Signed)
Mobility Specialist - Progress Note   05/11/22 0938  Mobility  Activity Ambulated with assistance in hallway  Level of Assistance Contact guard assist, steadying assist  Assistive Device Front wheel walker  Distance Ambulated (ft) 80 ft  Range of Motion/Exercises Active  Activity Response Tolerated well  $Mobility charge 1 Mobility   Pt was found in the bathroom and agreeable to ambulate. During ambulation stated having a 8/10 lower left back pain. Left side began getting unsteady towards the EOS. At EOS returned to bed with all necessities in reach.  Ferd Hibbs Mobility Specialist

## 2022-05-11 NOTE — Plan of Care (Signed)
  Problem: Activity: Goal: Risk for activity intolerance will decrease Outcome: Progressing   Problem: Pain Managment: Goal: General experience of comfort will improve Outcome: Progressing   

## 2022-05-11 NOTE — Progress Notes (Signed)
PROGRESS NOTE  Tamara Friedman IRJ:[id number] DOB: Nov 20, 1950 DOA: 05/06/2022 PCP: Lucianne Lei, MD   LOS: 4 days   Brief Narrative / Interim history: 71 year old female with HTN, HLD comes in with nausea, vomiting, chills and left flank pain for the past day.  Initial evaluation was concerning for UTI, CT abdomen and pelvis was without acute findings.  She was sent home from the ED on p.o. Keflex, but was unable to keep down so came back to the hospital.  She was placed on ceftriaxone, however cultures showing VRE  Subjective / 24h Interval events: Overall nausea and vomiting is better, p.o. intake improving.  Continues to have significant flank pain and has difficulty ambulating related to this.  Assesement and Plan: Principal Problem:   Acute cystitis Active Problems:   Intractable vomiting with nausea   Acute renal failure superimposed on stage 4 chronic kidney disease (HCC)   HTN (hypertension)   Dyslipidemia   Generalized weakness   Abdominal pain   Metabolic acidosis   Hypokalemia   Principal problem Intractable nausea, vomiting, abdominal pain as well as left flank pain-due to VRE UTI and clinical concern for pyelonephritis despite negative CT scan.  She is initially maintained on ceftriaxone but did not really improve, urine cultures eventually speciated VRE and she has been switched to linezolid yesterday.  Nausea and vomiting seems to have improved  Continues to complain of significant flank pain, feels that her symptoms started after her fall that occurred prior to admission since she fell on her left side Check x-ray of lumbar spine ?  If this is related to muscle strain.  Start on scheduled tizanidine  Active problems Acute kidney injury on chronic kidney disease stage IV-baseline creatinine around 1.6-1.7, and was as high as 2.5 on admission.  Then has returned to her baseline.  Now appreciate some mild swelling in her legs, stop IV fluids  Metabolic acidosis-due to  dehydration, acute kidney injury.  Stable  Essential hypertension-blood pressure varies but acceptable for most part, continue amlodipine, Coreg  Generalized weakness-PT/OT recommends home health PT  Scheduled Meds:  amLODipine  10 mg Oral Daily   carvedilol  25 mg Oral BID WC   heparin  5,000 Units Subcutaneous Q8H   linezolid  600 mg Oral Q12H   ondansetron  4 mg Oral Once   tiZANidine  4 mg Oral TID   Continuous Infusions:   PRN Meds:.acetaminophen **OR** acetaminophen, HYDROmorphone (DILAUDID) injection, ondansetron (ZOFRAN) IV, oxyCODONE, senna-docusate  Current Outpatient Medications  Medication Instructions   albuterol (VENTOLIN HFA) 108 (90 Base) MCG/ACT inhaler 1-2 puffs, Inhalation, Every 6 hours PRN   amLODipine (NORVASC) 10 mg, Oral, Daily   aspirin 81 mg, Oral, Daily   atorvastatin (LIPITOR) 10 mg, Oral, Daily   carvedilol (COREG) 25 mg, Oral, 2 times daily with meals   cloNIDine (CATAPRES) 0.2 mg, Oral, 3 times daily   magnesium hydroxide (MILK OF MAGNESIA) 400 MG/5ML suspension 30 mLs, Oral, Daily PRN   MELATONIN PO 1 tablet, Oral, Daily at bedtime   Multiple Vitamins-Minerals (MULTIVITAMIN ADULTS) TABS 1 tablet, Oral, Daily after supper   ondansetron (ZOFRAN-ODT) 8 mg, Oral, Every 8 hours PRN   oxyCODONE (OXY IR/ROXICODONE) 5 mg, Oral, Every 6 hours PRN   polyethylene glycol (MIRALAX / GLYCOLAX) 17 g, Oral, Daily    Diet Orders (From admission, onward)     Start     Ordered   05/07/22 0555  Diet heart healthy/carb modified Room service appropriate? Yes; Fluid consistency: Thin  Diet effective now       Question Answer Comment  Diet-HS Snack? Nothing   Room service appropriate? Yes   Fluid consistency: Thin      05/07/22 0556            DVT prophylaxis: heparin injection 5,000 Units Start: 05/07/22 0600 SCDs Start: 05/07/22 0555   Lab Results  Component Value Date   PLT 245 05/11/2022      Code Status: Full Code  Family Communication: No  family at bedside,   Status is: Inpatient Remains inpatient appropriate because: Persistent symptoms, likely home within 24 hours if her flank pain is better   Level of care: Med-Surg  Consultants:  none  Objective: Vitals:   05/10/22 1425 05/10/22 2137 05/11/22 0354 05/11/22 1320  BP: 129/74 (!) 159/87 (!) 152/82 135/79  Pulse: 74 71 74 69  Resp: '16  18 15  '$ Temp: 98.9 F (37.2 C) 98.6 F (37 C) 98.4 F (36.9 C) 98.4 F (36.9 C)  TempSrc: Oral   Oral  SpO2: 100% 100% 100% 99%  Weight:      Height:       No intake or output data in the 24 hours ending 05/11/22 2021 Wt Readings from Last 3 Encounters:  05/07/22 72.6 kg  03/12/22 72.6 kg  12/26/21 71.6 kg    Examination:  Constitutional: NAD Eyes: lids and conjunctivae normal, no scleral icterus ENMT: mmm Neck: normal, supple Respiratory: clear to auscultation bilaterally, no wheezing, no crackles. Normal respiratory effort.  Cardiovascular: Regular rate and rhythm, no murmurs / rubs / gallops. No LE edema. Abdomen: soft, no distention, no tenderness. Bowel sounds positive.  Skin: no rashes Neurologic: no focal deficits, equal strength MSK: Tender over left flank   Data Reviewed: I have independently reviewed following labs and imaging studies   CBC Recent Labs  Lab 05/06/22 1827 05/07/22 0707 05/09/22 0335 05/10/22 0337 05/11/22 0328  WBC 9.1 9.9 7.3 8.3 8.9  HGB 12.1 11.8* 9.6* 9.1* 9.3*  HCT 40.2 38.2 32.6* 31.7* 31.2*  PLT 311 285 230 221 245  MCV 84.6 83.4 87.6 89.5 86.0  MCH 25.5* 25.8* 25.8* 25.7* 25.6*  MCHC 30.1 30.9 29.4* 28.7* 29.8*  RDW 16.4* 16.6* 16.7* 17.2* 17.2*  LYMPHSABS 2.5  --  2.1  --   --   MONOABS 0.8  --  0.7  --   --   EOSABS 1.0*  --  1.1*  --   --   BASOSABS 0.1  --  0.0  --   --     Recent Labs  Lab 05/06/22 1827 05/07/22 0707 05/07/22 1222 05/07/22 1653 05/09/22 0335 05/10/22 0337 05/11/22 0328  NA 137  --  141  --  138 135 135  K 3.8  --  3.4*  --  5.1 4.9  4.9  CL 111  --  115*  --  114* 113* 110  CO2 16*  --  18*  --  19* 19* 20*  GLUCOSE 103*  --  127*  --  92 83 92  BUN 50*  --  43*  --  '23 17 22  '$ CREATININE 2.50* 2.40* 2.03*  --  1.78* 1.53* 1.52*  CALCIUM 9.1  --  8.0*  --  8.0* 8.2* 8.7*  AST 11*  --  10*  --  9* 8*  --   ALT 13  --  10  --  8 8  --   ALKPHOS 69  --  58  --  45 45  --   BILITOT 0.6  --  0.6  --  0.3 0.3  --   ALBUMIN 3.8  --  3.1*  --  2.5* 2.4*  --   MG  --   --  2.3  --   --  1.7 1.7  LATICACIDVEN  --   --  1.2 1.0  --   --   --     ------------------------------------------------------------------------------------------------------------------ No results for input(s): "CHOL", "HDL", "LDLCALC", "TRIG", "CHOLHDL", "LDLDIRECT" in the last 72 hours.  No results found for: "HGBA1C" ------------------------------------------------------------------------------------------------------------------ No results for input(s): "TSH", "T4TOTAL", "T3FREE", "THYROIDAB" in the last 72 hours.  Invalid input(s): "FREET3"  Cardiac Enzymes No results for input(s): "CKMB", "TROPONINI", "MYOGLOBIN" in the last 168 hours.  Invalid input(s): "CK" ------------------------------------------------------------------------------------------------------------------    Component Value Date/Time   BNP 93.6 05/02/2022 1138    CBG: No results for input(s): "GLUCAP" in the last 168 hours.  Recent Results (from the past 240 hour(s))  Urine Culture     Status: Abnormal   Collection Time: 05/07/22  4:21 AM   Specimen: In/Out Cath Urine  Result Value Ref Range Status   Specimen Description   Final    IN/OUT CATH URINE Performed at [facility], [place] [address] [place], [place] [address]    Special Requests   Final    NONE Performed at [facility], [place] [address] [place], [place] [address]    Culture (A)  Final    >=100,000 COLONIES/mL ENTEROCOCCUS FAECIUM VANCOMYCIN RESISTANT ENTEROCOCCUS  ISOLATED >=100,000 COLONIES/mL DIPHTHEROIDS(CORYNEBACTERIUM SPECIES) Standardized susceptibility testing for this organism is not available. Performed at [facility], [place] [address], [place], [place] [address]    Report Status 05/09/2022 FINAL  Final   Organism ID, Bacteria ENTEROCOCCUS FAECIUM (A)  Final      Susceptibility   Enterococcus faecium - MIC*    AMPICILLIN >=32 RESISTANT Resistant     NITROFURANTOIN 64 INTERMEDIATE Intermediate     VANCOMYCIN >=32 RESISTANT Resistant     LINEZOLID 2 SENSITIVE Sensitive     * >=100,000 COLONIES/mL ENTEROCOCCUS FAECIUM     Radiology Studies: DG Lumbar Spine 1 View  Result Date: 05/11/2022 CLINICAL DATA:  Back pain EXAM: LUMBAR SPINE - 1 VIEW COMPARISON:  09/02/2017 FINDINGS: Single AP view is submitted for review. Evaluation of lumbar spine is limited. As far as seen, no definite fractures are noted. Degenerative changes are noted with facet hypertrophy in the lower lumbar spine. Moderate to large amount of stool is seen in ascending and transverse colon. IMPRESSION: Evaluation is limited in this single AP view of lumbar spine. No definite fracture is seen. Degenerative changes are noted in lower lumbar spine. Electronically Signed   By: Elmer Picker M.D.   On: 05/11/2022 15:57     Kathie Dike, MD [facility]  Between 7 am - 7 pm I am available, please contact me via [place] (for emergencies) or Securechat (non urgent messages)  Between 7 pm - 7 am I am not available, please contact night coverage MD/APP via Amion

## 2022-05-12 ENCOUNTER — Other Ambulatory Visit (HOSPITAL_COMMUNITY): Payer: Self-pay

## 2022-05-12 MED ORDER — TIZANIDINE HCL 4 MG PO TABS
4.0000 mg | ORAL_TABLET | Freq: Three times a day (TID) | ORAL | 0 refills | Status: AC | PRN
  Filled 2022-05-12: qty 30, 10d supply, fill #0

## 2022-05-12 MED ORDER — LINEZOLID 600 MG PO TABS
600.0000 mg | ORAL_TABLET | Freq: Two times a day (BID) | ORAL | 0 refills | Status: AC
  Filled 2022-05-12: qty 6, 3d supply, fill #0

## 2022-05-12 MED ORDER — OXYCODONE HCL 5 MG PO TABS
5.0000 mg | ORAL_TABLET | Freq: Four times a day (QID) | ORAL | 0 refills | Status: DC | PRN
  Filled 2022-05-12: qty 15, 4d supply, fill #0

## 2022-05-12 NOTE — Discharge Summary (Signed)
Physician Discharge Summary  Tamara Friedman:[id number] DOB: [birth year] DOA: 05/06/2022  PCP: Lucianne Lei, MD  Admit date: 05/06/2022 Discharge date: 05/12/2022  Admitted From: home Disposition:  home  Recommendations for Outpatient Follow-up:  Follow up with PCP in 1-2 weeks Please obtain BMP/CBC in one week  Home Health:home health PT/OT Equipment/Devices:  Discharge Condition:stable CODE STATUS:full code Diet recommendation: heart healthy  Brief/Interim Summary: 71 year old female with HTN, HLD comes in with nausea, vomiting, chills and left flank pain for the past day.  Initial evaluation was concerning for UTI, CT abdomen and pelvis was without acute findings.  She was sent home from the ED on p.o. Keflex, but was unable to keep down so came back to the hospital.  She was placed on ceftriaxone, however cultures showing VRE  Discharge Diagnoses:  Principal Problem:   Acute cystitis Active Problems:   Intractable vomiting with nausea   Acute renal failure superimposed on stage 4 chronic kidney disease (HCC)   HTN (hypertension)   Dyslipidemia   Generalized weakness   Abdominal pain   Metabolic acidosis   Hypokalemia  Intractable nausea, vomiting VRE UTI -urine cultures positive for VRE -antibiotics switched to linezolid -she reports improvement in her symptoms  Back/flank pain -reports pain started after she had a fall prior to admission -pain stays in back, does not radiate down her legs -she denies any weakness in her legs -suspect that she may have had a muscle strain -continue supportive management with pain meds, muscle relaxants and PT -will likely take a few weeks to completely resolve  AKI on CKD stage 4 -Creatinine 2.5 on admission -She received IV fluids and creatinine now back to baseline at 1.5  Remainder of medical problems remained stable  Discharge Instructions  Discharge Instructions     Diet - low sodium heart healthy   Complete by: As  directed    Increase activity slowly   Complete by: As directed       Allergies as of 05/12/2022   No Known Allergies      Medication List     STOP taking these medications    cephALEXin 500 MG capsule Commonly known as: KEFLEX   cloNIDine 0.2 MG tablet Commonly known as: CATAPRES       TAKE these medications    albuterol 108 (90 Base) MCG/ACT inhaler Commonly known as: VENTOLIN HFA Inhale 1-2 puffs into the lungs every 6 (six) hours as needed for wheezing or shortness of breath.   amLODipine 10 MG tablet Commonly known as: NORVASC Take 1 tablet (10 mg total) by mouth daily.   aspirin 81 MG tablet Take 81 mg by mouth daily.   atorvastatin 10 MG tablet Commonly known as: LIPITOR Take 10 mg by mouth daily.   carvedilol 25 MG tablet Commonly known as: COREG Take 25 mg by mouth 2 (two) times daily with a meal.   linezolid 600 MG tablet Commonly known as: ZYVOX Take 1 tablet (600 mg total) by mouth every 12 (twelve) hours for 3 days.   magnesium hydroxide 400 MG/5ML suspension Commonly known as: MILK OF MAGNESIA Take 30 mLs by mouth daily as needed for mild constipation.   MELATONIN PO Take 1 tablet by mouth at bedtime.   Multivitamin Adults Tabs Take 1 tablet by mouth daily after supper.   ondansetron 8 MG disintegrating tablet Commonly known as: ZOFRAN-ODT Take 1 tablet (8 mg total) by mouth every 8 (eight) hours as needed for nausea or vomiting.   oxyCODONE 5  MG immediate release tablet Commonly known as: Oxy IR/ROXICODONE Take 1 tablet (5 mg total) by mouth every 6 (six) hours as needed for severe pain.   polyethylene glycol 17 g packet Commonly known as: MIRALAX / GLYCOLAX Take 17 g by mouth daily. What changed:  when to take this reasons to take this   tiZANidine 4 MG tablet Commonly known as: ZANAFLEX Take 1 tablet (4 mg total) by mouth every 8 (eight) hours as needed for muscle spasms.        No Known  Allergies  Consultations:    Procedures/Studies: DG Lumbar Spine 1 View  Result Date: 05/11/2022 CLINICAL DATA:  Back pain EXAM: LUMBAR SPINE - 1 VIEW COMPARISON:  09/02/2017 FINDINGS: Single AP view is submitted for review. Evaluation of lumbar spine is limited. As far as seen, no definite fractures are noted. Degenerative changes are noted with facet hypertrophy in the lower lumbar spine. Moderate to large amount of stool is seen in ascending and transverse colon. IMPRESSION: Evaluation is limited in this single AP view of lumbar spine. No definite fracture is seen. Degenerative changes are noted in lower lumbar spine. Electronically Signed   By: Elmer Picker M.D.   On: 05/11/2022 15:57   CT ABDOMEN PELVIS WO CONTRAST  Result Date: 05/07/2022 CLINICAL DATA:  Vomiting for 2 days. EXAM: CT ABDOMEN AND PELVIS WITHOUT CONTRAST TECHNIQUE: Multidetector CT imaging of the abdomen and pelvis was performed following the standard protocol without IV contrast. RADIATION DOSE REDUCTION: This exam was performed according to the departmental dose-optimization program which includes automated exposure control, adjustment of the mA and/or kV according to patient size and/or use of iterative reconstruction technique. COMPARISON:  05/02/2022. FINDINGS: Lower chest: No acute abnormality. Hepatobiliary: No focal liver abnormality is seen. No gallstones, gallbladder wall thickening, or biliary dilatation. Pancreas: Unremarkable. No pancreatic ductal dilatation or surrounding inflammatory changes. Spleen: Normal in size without focal abnormality. Adrenals/Urinary Tract: Normal adrenal glands. Kidneys normal in size, orientation and position. Nearly isoattenuating mass, lateral midpole the left kidney, 2.1 cm. Low-attenuation mass, anterior lower pole of the left kidney, 1.9 cm, consistent with a cyst. No other renal masses, no stones and no hydronephrosis. Normal ureters. Normal bladder. Stomach/Bowel:  Moderate-sized hiatal hernia. Stomach otherwise unremarkable. Small bowel and colon are normal in caliber. No wall thickening. No inflammation. Scattered left colon diverticula. Normal appendix visualized. Vascular/Lymphatic: Aortic atherosclerosis. No enlarged lymph nodes. Reproductive: Status post hysterectomy. No adnexal masses. Other: Small fat containing umbilical hernia. Loop of small bowel lies at the entrance to the hernia. No ascites. Musculoskeletal: No fracture or acute finding.  No bone lesion. IMPRESSION: 1. No acute findings within the abdomen or pelvis. 2. Moderate size hiatal hernia. 3. Renal masses, which are stable from the prior CT, better assessed on the prior exam due to the presence of contrast. Renal masses were assessed as cysts with no follow-up recommended. 4. Aortic atherosclerosis. Electronically Signed   By: Lajean Manes M.D.   On: 05/07/2022 12:22   DG ABD ACUTE 2+V W 1V CHEST  Result Date: 05/06/2022 CLINICAL DATA:  Vomiting with chest pain. EXAM: DG ABDOMEN ACUTE WITH 1 VIEW CHEST COMPARISON:  Chest x-ray 05/06/2022. CT abdomen and pelvis 05/02/2022 FINDINGS: Moderate hiatal hernia is again seen. Cardiomediastinal silhouette is within normal limits. Lungs are clear. No pleural effusion or pneumothorax. There is no free air. Bowel-gas pattern is nonobstructive. There is average stool burden. There are phleboliths in the pelvis. No acute fractures are seen. No suspicious calcifications  are identified. IMPRESSION: 1. No acute cardiopulmonary disease. 2. Moderate hiatal hernia. 3. Nonobstructive bowel-gas pattern. Electronically Signed   By: Ronney Asters M.D.   On: 05/06/2022 19:00   CT ABDOMEN PELVIS W CONTRAST  Result Date: 05/02/2022 CLINICAL DATA:  Acute abdominal pain EXAM: CT ABDOMEN AND PELVIS WITH CONTRAST TECHNIQUE: Multidetector CT imaging of the abdomen and pelvis was performed using the standard protocol following bolus administration of intravenous contrast.  RADIATION DOSE REDUCTION: This exam was performed according to the departmental dose-optimization program which includes automated exposure control, adjustment of the mA and/or kV according to patient size and/or use of iterative reconstruction technique. CONTRAST:  146m OMNIPAQUE IOHEXOL 300 MG/ML  SOLN COMPARISON:  03/12/2022 and previous FINDINGS: Lower chest: No pleural or pericardial effusion. Moderate hiatal hernia. Minimal dependent atelectasis in the lung bases. Hepatobiliary: No focal liver abnormality is seen. No gallstones, gallbladder wall thickening, or biliary dilatation. Pancreas: Unremarkable. No pancreatic ductal dilatation or surrounding inflammatory changes. Spleen: Normal in size without focal abnormality. Adrenals/Urinary Tract: No adrenal mass. 2.2 cm exophytic 31 HU left upper pole probable cyst; no follow-up indicated. Stable 2.3 cm 2 HU mid left renal cyst; no follow-up indicated. No hydronephrosis or evident urolithiasis. Urinary bladder incompletely distended. Stomach/Bowel: Moderate hiatal hernia involving the gastric fundus. The stomach is nondilated. Small bowel decompressed. Normal appendix. The colon is incompletely distended with multiple descending and sigmoid diverticula; no adjacent inflammatory change. Vascular/Lymphatic: Heavy aortoiliac calcified atheromatous plaque without aneurysm. stenosis of the common iliac arteries of indeterminate hemodynamic significance. No abdominal or pelvic adenopathy. Reproductive: Status post hysterectomy. No adnexal masses. Other: Bilateral pelvic phleboliths.  No ascites.  No free air. Musculoskeletal: Small paraumbilical hernia containing only mesenteric fat. Mild facet DJD in the lower lumbar spine. Bilateral hip DJD. No acute bone findings. IMPRESSION: 1. No acute findings. 2. Moderate hiatal hernia involving the entire gastric fundus. 3. Descending and sigmoid diverticulosis. 4.  Aortic Atherosclerosis (ICD10-170.0). Electronically Signed    By: DLucrezia EuropeM.D.   On: 05/02/2022 13:19   DG Chest Port 1 View  Result Date: 05/02/2022 CLINICAL DATA:  Vomiting and nausea beginning yesterday. EXAM: PORTABLE CHEST 1 VIEW COMPARISON:  11/28/2021 FINDINGS: Heart size is normal. Ectasia of thoracic aorta again noted. A small to moderate hiatal hernia is seen. Both lungs are clear. IMPRESSION: No active cardiopulmonary disease. Small to moderate hiatal hernia. Electronically Signed   By: JMarlaine HindM.D.   On: 05/02/2022 10:08      Subjective: Feels that pain is better today with pain meds and tizanidine. She has not required any IV pain meds  Discharge Exam: Vitals:   05/11/22 0354 05/11/22 1320 05/11/22 2142 05/12/22 0623  BP: (!) 152/82 135/79 (!) 136/93 (!) 144/87  Pulse: 74 69 80 72  Resp: '18 15 20 18  '$ Temp: 98.4 F (36.9 C) 98.4 F (36.9 C) 98.8 F (37.1 C) 98.6 F (37 C)  TempSrc:  Oral Oral Oral  SpO2: 100% 99% 99% 98%  Weight:      Height:        General: Pt is alert, awake, not in acute distress Cardiovascular: RRR, S1/S2 +, no rubs, no gallops Respiratory: CTA bilaterally, no wheezing, no rhonchi Abdominal: Soft, NT, ND, bowel sounds + Extremities: no edema, no cyanosis    The results of significant diagnostics from this hospitalization (including imaging, microbiology, ancillary and laboratory) are listed below for reference.     Microbiology: Recent Results (from the past 240 hour(s))  Urine Culture  Status: Abnormal   Collection Time: 05/07/22  4:21 AM   Specimen: In/Out Cath Urine  Result Value Ref Range Status   Specimen Description   Final    IN/OUT CATH URINE Performed at [facility], [place] [address], [place], [place] [address]    Special Requests   Final    NONE Performed at [facility], [place] [address] [place], [place] [address]    Culture (A)  Final    >=100,000 COLONIES/mL ENTEROCOCCUS FAECIUM VANCOMYCIN RESISTANT ENTEROCOCCUS ISOLATED >=100,000  COLONIES/mL DIPHTHEROIDS(CORYNEBACTERIUM SPECIES) Standardized susceptibility testing for this organism is not available. Performed at [facility], [place] [address] [place],  [address]    Report Status 05/09/2022 FINAL  Final   Organism ID, Bacteria ENTEROCOCCUS FAECIUM (A)  Final      Susceptibility   Enterococcus faecium - MIC*    AMPICILLIN >=32 RESISTANT Resistant     NITROFURANTOIN 64 INTERMEDIATE Intermediate     VANCOMYCIN >=32 RESISTANT Resistant     LINEZOLID 2 SENSITIVE Sensitive     * >=100,000 COLONIES/mL ENTEROCOCCUS FAECIUM     Labs: BNP (last 3 results) Recent Labs    05/02/22 1138  BNP 91.4   Basic Metabolic Panel: Recent Labs  Lab 05/06/22 1827 05/07/22 0707 05/07/22 1222 05/09/22 0335 05/10/22 0337 05/11/22 0328  NA 137  --  141 138 135 135  K 3.8  --  3.4* 5.1 4.9 4.9  CL 111  --  115* 114* 113* 110  CO2 16*  --  18* 19* 19* 20*  GLUCOSE 103*  --  127* 92 83 92  BUN 50*  --  43* '23 17 22  '$ CREATININE 2.50* 2.40* 2.03* 1.78* 1.53* 1.52*  CALCIUM 9.1  --  8.0* 8.0* 8.2* 8.7*  MG  --   --  2.3  --  1.7 1.7  PHOS  --   --  3.1  --   --   --    Liver Function Tests: Recent Labs  Lab 05/06/22 1827 05/07/22 1222 05/09/22 0335 05/10/22 0337  AST 11* 10* 9* 8*  ALT '13 10 8 8  '$ ALKPHOS 69 58 45 45  BILITOT 0.6 0.6 0.3 0.3  PROT 7.6 6.4* 5.2* 5.1*  ALBUMIN 3.8 3.1* 2.5* 2.4*   Recent Labs  Lab 05/06/22 1827 05/07/22 1222 05/09/22 0335  LIPASE 108* 61* 76*   No results for input(s): "AMMONIA" in the last 168 hours. CBC: Recent Labs  Lab 05/06/22 1827 05/07/22 0707 05/09/22 0335 05/10/22 0337 05/11/22 0328  WBC 9.1 9.9 7.3 8.3 8.9  NEUTROABS 4.8  --  3.3  --   --   HGB 12.1 11.8* 9.6* 9.1* 9.3*  HCT 40.2 38.2 32.6* 31.7* 31.2*  MCV 84.6 83.4 87.6 89.5 86.0  PLT 311 285 230 221 245   Cardiac Enzymes: No results for input(s): "CKTOTAL", "CKMB", "CKMBINDEX", "TROPONINI" in the last 168 hours. BNP: Invalid input(s):  "POCBNP" CBG: No results for input(s): "GLUCAP" in the last 168 hours. D-Dimer No results for input(s): "DDIMER" in the last 72 hours. Hgb A1c No results for input(s): "HGBA1C" in the last 72 hours. Lipid Profile No results for input(s): "CHOL", "HDL", "LDLCALC", "TRIG", "CHOLHDL", "LDLDIRECT" in the last 72 hours. Thyroid function studies No results for input(s): "TSH", "T4TOTAL", "T3FREE", "THYROIDAB" in the last 72 hours.  Invalid input(s): "FREET3" Anemia work up No results for input(s): "VITAMINB12", "FOLATE", "FERRITIN", "TIBC", "IRON", "RETICCTPCT" in the last 72 hours. Urinalysis    Component  Value Date/Time   COLORURINE YELLOW 05/06/2022 1828   APPEARANCEUR HAZY (A) 05/06/2022 1828   LABSPEC 1.013 05/06/2022 1828   PHURINE 5.0 05/06/2022 1828   GLUCOSEU NEGATIVE 05/06/2022 1828   HGBUR NEGATIVE 05/06/2022 1828   BILIRUBINUR NEGATIVE 05/06/2022 1828   KETONESUR NEGATIVE 05/06/2022 1828   PROTEINUR NEGATIVE 05/06/2022 1828   UROBILINOGEN 0.2 07/19/2013 2044   NITRITE NEGATIVE 05/06/2022 1828   LEUKOCYTESUR MODERATE (A) 05/06/2022 1828   Sepsis Labs Recent Labs  Lab 05/07/22 0707 05/09/22 0335 05/10/22 0337 05/11/22 0328  WBC 9.9 7.3 8.3 8.9   Microbiology Recent Results (from the past 240 hour(s))  Urine Culture     Status: Abnormal   Collection Time: 05/07/22  4:21 AM   Specimen: In/Out Cath Urine  Result Value Ref Range Status   Specimen Description   Final    IN/OUT CATH URINE Performed at [facility], [place] [address], [place], [place] [address]    Special Requests   Final    NONE Performed at [facility], [place] [address], [place], [place] [address]    Culture (A)  Final    >=100,000 COLONIES/mL ENTEROCOCCUS FAECIUM VANCOMYCIN RESISTANT ENTEROCOCCUS ISOLATED >=100,000 COLONIES/mL DIPHTHEROIDS(CORYNEBACTERIUM SPECIES) Standardized susceptibility testing for this organism is not available. Performed at [facility], [place] [address] [place], [place] [address]    Report Status 05/09/2022 FINAL  Final   Organism ID, Bacteria ENTEROCOCCUS FAECIUM (A)  Final      Susceptibility   Enterococcus faecium - MIC*    AMPICILLIN >=32 RESISTANT Resistant     NITROFURANTOIN 64 INTERMEDIATE Intermediate     VANCOMYCIN >=32 RESISTANT Resistant     LINEZOLID 2 SENSITIVE Sensitive     * >=100,000 COLONIES/mL ENTEROCOCCUS FAECIUM     Time coordinating discharge: 94mns  SIGNED:   JKathie Dike MD  [facility] 05/12/2022, 9:57 AM   If 7PM-7AM, please contact night-coverage [email or web address]

## 2022-05-12 NOTE — Progress Notes (Signed)
Physical Therapy Treatment Patient Details Name: Tamara Friedman MRN: [id number] DOB: 1950-11-21 Today's Date: 05/12/2022   History of Present Illness Patient is a 71 year old female who was admitted with N/V, acute cystitis. PMH:HTN, CKD4    PT Comments    Pt AxO x 3 very pleasant, sitting EOB Indep getting dressed.  Assisted with amb around unit.  General transfer comment: increased self abiloity to rise and lower with good use b UE's to support self.  General Gait Details: 50% VC's on proper walker ro self distance as pt tends to push walker too far front.  Corrected on upright posture. Pt stated she has NO stairs to enter home.   Pt plans to D/C to home today.    Recommendations for follow up therapy are one component of a multi-disciplinary discharge planning process, led by the attending physician.  Recommendations may be updated based on patient status, additional functional criteria and insurance authorization.  Follow Up Recommendations  Home health PT     Assistance Recommended at Discharge Frequent or constant Supervision/Assistance  Patient can return home with the following A little help with walking and/or transfers;A little help with bathing/dressing/bathroom;Assistance with cooking/housework;Assist for transportation;Help with stairs or ramp for entrance   Equipment Recommendations  None recommended by PT    Recommendations for Other Services       Precautions / Restrictions Precautions Precautions: Fall Precaution Comments: pain from recent fall Restrictions Weight Bearing Restrictions: No     Mobility  Bed Mobility               General bed mobility comments: EOB indep dressing self    Transfers Overall transfer level: Needs assistance Equipment used: Rolling walker (2 wheels) Transfers: Sit to/from Stand Sit to Stand: Supervision           General transfer comment: increased self abiloity to rise and lower with good use b UE's to support  self    Ambulation/Gait Ambulation/Gait assistance: Supervision Gait Distance (Feet): 220 Feet Assistive device: Rolling walker (2 wheels) Gait Pattern/deviations: Step-through pattern, Decreased stride length, Trunk flexed, Shuffle Gait velocity: decreased     General Gait Details: 50% VC's on proper walker ro self distance as pt tends to push walker too far front.  Corrected on upright posture.   Stairs             Wheelchair Mobility    Modified Rankin (Stroke Patients Only)       Balance                                            Cognition Arousal/Alertness: Awake/alert Behavior During Therapy: WFL for tasks assessed/performed Overall Cognitive Status: Within Functional Limits for tasks assessed                                 General Comments: AxO x 3 very pleasant and eager to go home today        Exercises      General Comments        Pertinent Vitals/Pain Pain Assessment Pain Assessment: Faces Faces Pain Scale: Hurts a little bit Pain Location: bilateral flanks "they gave me some medication". Pain Descriptors / Indicators: Discomfort, Aching Pain Intervention(s): Monitored during session    Home Living  Prior Function            PT Goals (current goals can now be found in the care plan section) Progress towards PT goals: Progressing toward goals    Frequency    Min 3X/week      PT Plan Current plan remains appropriate    Co-evaluation              AM-PAC PT "6 Clicks" Mobility   Outcome Measure  Help needed turning from your back to your side while in a flat bed without using bedrails?: None Help needed moving from lying on your back to sitting on the side of a flat bed without using bedrails?: None Help needed moving to and from a bed to a chair (including a wheelchair)?: None Help needed standing up from a chair using your arms (e.g., wheelchair or  bedside chair)?: None Help needed to walk in hospital room?: A Little Help needed climbing 3-5 steps with a railing? : A Little 6 Click Score: 22    End of Session Equipment Utilized During Treatment: Gait belt Activity Tolerance: Patient limited by fatigue;Patient limited by pain Patient left: in chair;with call bell/phone within reach Nurse Communication: Mobility status PT Visit Diagnosis: Muscle weakness (generalized) (M62.81);Difficulty in walking, not elsewhere classified (R26.2);Pain     Time: 7517-0017 PT Time Calculation (min) (ACUTE ONLY): 12 min  Charges:  $Gait Training: 8-22 mins                     Rica Koyanagi  PTA [facility] M-F          [phone] Weekend pager [phone]

## 2022-05-16 DIAGNOSIS — I5020 Unspecified systolic (congestive) heart failure: Secondary | ICD-10-CM | POA: Diagnosis not present

## 2022-05-16 DIAGNOSIS — I10 Essential (primary) hypertension: Secondary | ICD-10-CM | POA: Diagnosis not present

## 2022-05-16 DIAGNOSIS — N189 Chronic kidney disease, unspecified: Secondary | ICD-10-CM | POA: Diagnosis not present

## 2022-05-16 DIAGNOSIS — E785 Hyperlipidemia, unspecified: Secondary | ICD-10-CM | POA: Diagnosis not present

## 2022-05-21 DIAGNOSIS — E785 Hyperlipidemia, unspecified: Secondary | ICD-10-CM | POA: Diagnosis not present

## 2022-05-21 DIAGNOSIS — M47816 Spondylosis without myelopathy or radiculopathy, lumbar region: Secondary | ICD-10-CM | POA: Diagnosis not present

## 2022-05-21 DIAGNOSIS — N184 Chronic kidney disease, stage 4 (severe): Secondary | ICD-10-CM | POA: Diagnosis not present

## 2022-05-21 DIAGNOSIS — N3000 Acute cystitis without hematuria: Secondary | ICD-10-CM | POA: Diagnosis not present

## 2022-05-21 DIAGNOSIS — I129 Hypertensive chronic kidney disease with stage 1 through stage 4 chronic kidney disease, or unspecified chronic kidney disease: Secondary | ICD-10-CM | POA: Diagnosis not present

## 2022-05-25 DIAGNOSIS — N3000 Acute cystitis without hematuria: Secondary | ICD-10-CM | POA: Diagnosis not present

## 2022-05-25 DIAGNOSIS — M47816 Spondylosis without myelopathy or radiculopathy, lumbar region: Secondary | ICD-10-CM | POA: Diagnosis not present

## 2022-05-25 DIAGNOSIS — E785 Hyperlipidemia, unspecified: Secondary | ICD-10-CM | POA: Diagnosis not present

## 2022-05-25 DIAGNOSIS — N184 Chronic kidney disease, stage 4 (severe): Secondary | ICD-10-CM | POA: Diagnosis not present

## 2022-05-25 DIAGNOSIS — I129 Hypertensive chronic kidney disease with stage 1 through stage 4 chronic kidney disease, or unspecified chronic kidney disease: Secondary | ICD-10-CM | POA: Diagnosis not present

## 2022-05-26 DIAGNOSIS — I129 Hypertensive chronic kidney disease with stage 1 through stage 4 chronic kidney disease, or unspecified chronic kidney disease: Secondary | ICD-10-CM | POA: Diagnosis not present

## 2022-05-26 DIAGNOSIS — M47816 Spondylosis without myelopathy or radiculopathy, lumbar region: Secondary | ICD-10-CM | POA: Diagnosis not present

## 2022-05-26 DIAGNOSIS — E785 Hyperlipidemia, unspecified: Secondary | ICD-10-CM | POA: Diagnosis not present

## 2022-05-26 DIAGNOSIS — N3000 Acute cystitis without hematuria: Secondary | ICD-10-CM | POA: Diagnosis not present

## 2022-05-26 DIAGNOSIS — N184 Chronic kidney disease, stage 4 (severe): Secondary | ICD-10-CM | POA: Diagnosis not present

## 2022-05-27 DIAGNOSIS — E785 Hyperlipidemia, unspecified: Secondary | ICD-10-CM | POA: Diagnosis not present

## 2022-05-27 DIAGNOSIS — M47816 Spondylosis without myelopathy or radiculopathy, lumbar region: Secondary | ICD-10-CM | POA: Diagnosis not present

## 2022-05-27 DIAGNOSIS — N184 Chronic kidney disease, stage 4 (severe): Secondary | ICD-10-CM | POA: Diagnosis not present

## 2022-05-27 DIAGNOSIS — N3000 Acute cystitis without hematuria: Secondary | ICD-10-CM | POA: Diagnosis not present

## 2022-05-27 DIAGNOSIS — I129 Hypertensive chronic kidney disease with stage 1 through stage 4 chronic kidney disease, or unspecified chronic kidney disease: Secondary | ICD-10-CM | POA: Diagnosis not present

## 2022-05-31 ENCOUNTER — Encounter (HOSPITAL_COMMUNITY): Payer: Self-pay

## 2022-05-31 ENCOUNTER — Other Ambulatory Visit: Payer: Self-pay

## 2022-05-31 ENCOUNTER — Emergency Department (HOSPITAL_COMMUNITY): Payer: Medicare Other

## 2022-05-31 ENCOUNTER — Inpatient Hospital Stay (HOSPITAL_COMMUNITY)
Admission: EM | Admit: 2022-05-31 | Discharge: 2022-06-04 | DRG: 683 | Disposition: A | Discharge status: Home Health | Payer: Medicare Other | Attending: Internal Medicine | Admitting: Internal Medicine

## 2022-05-31 DIAGNOSIS — N281 Cyst of kidney, acquired: Secondary | ICD-10-CM | POA: Diagnosis not present

## 2022-05-31 DIAGNOSIS — K449 Diaphragmatic hernia without obstruction or gangrene: Secondary | ICD-10-CM | POA: Diagnosis not present

## 2022-05-31 DIAGNOSIS — I10 Essential (primary) hypertension: Secondary | ICD-10-CM | POA: Diagnosis present

## 2022-05-31 DIAGNOSIS — R112 Nausea with vomiting, unspecified: Secondary | ICD-10-CM | POA: Diagnosis present

## 2022-05-31 DIAGNOSIS — D631 Anemia in chronic kidney disease: Secondary | ICD-10-CM | POA: Diagnosis not present

## 2022-05-31 DIAGNOSIS — N179 Acute kidney failure, unspecified: Secondary | ICD-10-CM | POA: Diagnosis not present

## 2022-05-31 DIAGNOSIS — I129 Hypertensive chronic kidney disease with stage 1 through stage 4 chronic kidney disease, or unspecified chronic kidney disease: Secondary | ICD-10-CM | POA: Diagnosis not present

## 2022-05-31 DIAGNOSIS — N3001 Acute cystitis with hematuria: Secondary | ICD-10-CM | POA: Diagnosis not present

## 2022-05-31 DIAGNOSIS — R000 Tachycardia, unspecified: Secondary | ICD-10-CM | POA: Diagnosis not present

## 2022-05-31 DIAGNOSIS — B9620 Unspecified Escherichia coli [E. coli] as the cause of diseases classified elsewhere: Secondary | ICD-10-CM | POA: Diagnosis present

## 2022-05-31 DIAGNOSIS — I130 Hypertensive heart and chronic kidney disease with heart failure and stage 1 through stage 4 chronic kidney disease, or unspecified chronic kidney disease: Secondary | ICD-10-CM | POA: Diagnosis not present

## 2022-05-31 DIAGNOSIS — R109 Unspecified abdominal pain: Secondary | ICD-10-CM | POA: Diagnosis not present

## 2022-05-31 DIAGNOSIS — E785 Hyperlipidemia, unspecified: Secondary | ICD-10-CM | POA: Diagnosis present

## 2022-05-31 DIAGNOSIS — I5042 Chronic combined systolic (congestive) and diastolic (congestive) heart failure: Secondary | ICD-10-CM | POA: Diagnosis not present

## 2022-05-31 DIAGNOSIS — E7800 Pure hypercholesterolemia, unspecified: Secondary | ICD-10-CM | POA: Diagnosis present

## 2022-05-31 DIAGNOSIS — N184 Chronic kidney disease, stage 4 (severe): Secondary | ICD-10-CM | POA: Diagnosis not present

## 2022-05-31 DIAGNOSIS — Z79890 Hormone replacement therapy (postmenopausal): Secondary | ICD-10-CM

## 2022-05-31 DIAGNOSIS — E860 Dehydration: Secondary | ICD-10-CM | POA: Diagnosis present

## 2022-05-31 DIAGNOSIS — R748 Abnormal levels of other serum enzymes: Secondary | ICD-10-CM | POA: Diagnosis present

## 2022-05-31 DIAGNOSIS — E440 Moderate protein-calorie malnutrition: Secondary | ICD-10-CM | POA: Insufficient documentation

## 2022-05-31 DIAGNOSIS — D649 Anemia, unspecified: Secondary | ICD-10-CM | POA: Diagnosis not present

## 2022-05-31 DIAGNOSIS — I700 Atherosclerosis of aorta: Secondary | ICD-10-CM | POA: Diagnosis not present

## 2022-05-31 DIAGNOSIS — Z66 Do not resuscitate: Secondary | ICD-10-CM | POA: Diagnosis present

## 2022-05-31 DIAGNOSIS — N390 Urinary tract infection, site not specified: Secondary | ICD-10-CM | POA: Diagnosis present

## 2022-05-31 DIAGNOSIS — I5022 Chronic systolic (congestive) heart failure: Secondary | ICD-10-CM | POA: Diagnosis not present

## 2022-05-31 DIAGNOSIS — E876 Hypokalemia: Secondary | ICD-10-CM | POA: Diagnosis not present

## 2022-05-31 DIAGNOSIS — N1832 Chronic kidney disease, stage 3b: Secondary | ICD-10-CM | POA: Diagnosis not present

## 2022-05-31 DIAGNOSIS — E869 Volume depletion, unspecified: Secondary | ICD-10-CM | POA: Diagnosis not present

## 2022-05-31 DIAGNOSIS — Z90711 Acquired absence of uterus with remaining cervical stump: Secondary | ICD-10-CM | POA: Diagnosis not present

## 2022-05-31 DIAGNOSIS — I2699 Other pulmonary embolism without acute cor pulmonale: Secondary | ICD-10-CM | POA: Insufficient documentation

## 2022-05-31 DIAGNOSIS — I509 Heart failure, unspecified: Secondary | ICD-10-CM | POA: Diagnosis not present

## 2022-05-31 DIAGNOSIS — Z23 Encounter for immunization: Secondary | ICD-10-CM

## 2022-05-31 DIAGNOSIS — N3090 Cystitis, unspecified without hematuria: Secondary | ICD-10-CM

## 2022-05-31 DIAGNOSIS — Z7982 Long term (current) use of aspirin: Secondary | ICD-10-CM

## 2022-05-31 DIAGNOSIS — E872 Acidosis, unspecified: Secondary | ICD-10-CM | POA: Diagnosis present

## 2022-05-31 DIAGNOSIS — Z79899 Other long term (current) drug therapy: Secondary | ICD-10-CM

## 2022-05-31 DIAGNOSIS — K859 Acute pancreatitis without necrosis or infection, unspecified: Secondary | ICD-10-CM | POA: Diagnosis present

## 2022-05-31 LAB — URINALYSIS, ROUTINE W REFLEX MICROSCOPIC
Bilirubin Urine: NEGATIVE
Glucose, UA: NEGATIVE mg/dL
Hgb urine dipstick: NEGATIVE
Ketones, ur: NEGATIVE mg/dL
Nitrite: NEGATIVE
Protein, ur: >=300 mg/dL — AB
Specific Gravity, Urine: 1.011 (ref 1.005–1.030)
WBC, UA: >50 WBC/hpf — ABNORMAL HIGH (ref 0–5)
pH: 5 (ref 5.0–8.0)

## 2022-05-31 LAB — HEPATIC FUNCTION PANEL
ALT: 14 U/L (ref 0–44)
AST: 17 U/L (ref 15–41)
Albumin: 3.9 g/dL (ref 3.5–5.0)
Alkaline Phosphatase: 87 U/L (ref 38–126)
Bilirubin, Direct: 0.1 mg/dL (ref 0.0–0.2)
Indirect Bilirubin: 0.6 mg/dL (ref 0.3–0.9)
Total Bilirubin: 0.7 mg/dL (ref 0.3–1.2)
Total Protein: 7.5 g/dL (ref 6.5–8.1)

## 2022-05-31 LAB — CBC WITH DIFFERENTIAL/PLATELET
Abs Immature Granulocytes: 0.02 10*3/uL (ref 0.00–0.07)
Basophils Absolute: 0 10*3/uL (ref 0.0–0.1)
Basophils Relative: 0 %
Eosinophils Absolute: 0 10*3/uL (ref 0.0–0.5)
Eosinophils Relative: 0 %
HCT: 37.3 % (ref 36.0–46.0)
Hemoglobin: 11.1 g/dL — ABNORMAL LOW (ref 12.0–15.0)
Immature Granulocytes: 0 %
Lymphocytes Relative: 18 %
Lymphs Abs: 1.6 10*3/uL (ref 0.7–4.0)
MCH: 25.6 pg — ABNORMAL LOW (ref 26.0–34.0)
MCHC: 29.8 g/dL — ABNORMAL LOW (ref 30.0–36.0)
MCV: 85.9 fL (ref 80.0–100.0)
Monocytes Absolute: 0.8 10*3/uL (ref 0.1–1.0)
Monocytes Relative: 9 %
Neutro Abs: 6.5 10*3/uL (ref 1.7–7.7)
Neutrophils Relative %: 73 %
Platelets: 339 10*3/uL (ref 150–400)
RBC: 4.34 MIL/uL (ref 3.87–5.11)
RDW: 17.6 % — ABNORMAL HIGH (ref 11.5–15.5)
WBC: 8.8 10*3/uL (ref 4.0–10.5)
nRBC: 0 % (ref 0.0–0.2)

## 2022-05-31 LAB — COMPREHENSIVE METABOLIC PANEL
ALT: 17 U/L (ref 0–44)
AST: 17 U/L (ref 15–41)
Albumin: 4.5 g/dL (ref 3.5–5.0)
Alkaline Phosphatase: 105 U/L (ref 38–126)
Anion gap: 13 (ref 5–15)
BUN: 35 mg/dL — ABNORMAL HIGH (ref 8–23)
CO2: 19 mmol/L — ABNORMAL LOW (ref 22–32)
Calcium: 10.3 mg/dL (ref 8.9–10.3)
Chloride: 109 mmol/L (ref 98–111)
Creatinine, Ser: 2.39 mg/dL — ABNORMAL HIGH (ref 0.44–1.00)
GFR, Estimated: 21 mL/min — ABNORMAL LOW (ref >60)
Glucose, Bld: 133 mg/dL — ABNORMAL HIGH (ref 70–99)
Potassium: 4.5 mmol/L (ref 3.5–5.1)
Sodium: 141 mmol/L (ref 135–145)
Total Bilirubin: 0.8 mg/dL (ref 0.3–1.2)
Total Protein: 8.8 g/dL — ABNORMAL HIGH (ref 6.5–8.1)

## 2022-05-31 LAB — LACTIC ACID, PLASMA
Lactic Acid, Venous: 1.7 mmol/L (ref 0.5–1.9)
Lactic Acid, Venous: 1.6 mmol/L (ref 0.5–1.9)

## 2022-05-31 LAB — BLOOD GAS, VENOUS
Acid-base deficit: 2.4 mmol/L — ABNORMAL HIGH (ref 0.0–2.0)
Bicarbonate: 23.2 mmol/L (ref 20.0–28.0)
O2 Saturation: 48.1 %
Patient temperature: 37
pCO2, Ven: 42 mmHg — ABNORMAL LOW (ref 44–60)
pH, Ven: 7.35 (ref 7.25–7.43)
pO2, Ven: 34 mmHg (ref 32–45)

## 2022-05-31 LAB — LIPASE, BLOOD: Lipase: 67 U/L — ABNORMAL HIGH (ref 11–51)

## 2022-05-31 LAB — MAGNESIUM: Magnesium: 2.4 mg/dL (ref 1.7–2.4)

## 2022-05-31 LAB — CBC
HCT: 39.7 % (ref 36.0–46.0)
Hemoglobin: 12.2 g/dL (ref 12.0–15.0)
MCH: 25.7 pg — ABNORMAL LOW (ref 26.0–34.0)
MCHC: 30.7 g/dL (ref 30.0–36.0)
MCV: 83.8 fL (ref 80.0–100.0)
Platelets: 480 10*3/uL — ABNORMAL HIGH (ref 150–400)
RBC: 4.74 MIL/uL (ref 3.87–5.11)
RDW: 17.3 % — ABNORMAL HIGH (ref 11.5–15.5)
WBC: 7.9 10*3/uL (ref 4.0–10.5)
nRBC: 0 % (ref 0.0–0.2)

## 2022-05-31 LAB — CK: Total CK: 62 U/L (ref 38–234)

## 2022-05-31 LAB — PHOSPHORUS: Phosphorus: 4.9 mg/dL — ABNORMAL HIGH (ref 2.5–4.6)

## 2022-05-31 LAB — SODIUM, URINE, RANDOM: Sodium, Ur: 77 mmol/L

## 2022-05-31 LAB — CREATININE, URINE, RANDOM: Creatinine, Urine: 119 mg/dL

## 2022-05-31 MED ORDER — SODIUM CHLORIDE 0.9 % IV SOLN: INTRAVENOUS | Status: AC

## 2022-05-31 MED ORDER — ASPIRIN 81 MG PO CHEW
81.0000 mg | CHEWABLE_TABLET | Freq: Every day | ORAL | Status: DC
  Administered 2022-06-01 – 2022-06-04 (×4): 81 mg via ORAL
  Filled 2022-05-31 (×4): qty 1

## 2022-05-31 MED ORDER — SODIUM CHLORIDE 0.9 % IV SOLN
1.0000 g | INTRAVENOUS | Status: AC
  Administered 2022-06-01 – 2022-06-02 (×2): 1 g via INTRAVENOUS
  Filled 2022-05-31 (×2): qty 10

## 2022-05-31 MED ORDER — ACETAMINOPHEN 325 MG PO TABS: 650.0000 mg | ORAL_TABLET | Freq: Four times a day (QID) | ORAL | Status: DC | PRN

## 2022-05-31 MED ORDER — PNEUMOCOCCAL 20-VAL CONJ VACC 0.5 ML IM SUSY
0.5000 mL | PREFILLED_SYRINGE | INTRAMUSCULAR | Status: AC
  Administered 2022-06-01: 0.5 mL via INTRAMUSCULAR
  Filled 2022-05-31: qty 0.5

## 2022-05-31 MED ORDER — SODIUM CHLORIDE 0.9 % IV SOLN
1.0000 g | Freq: Once | INTRAVENOUS | Status: AC
  Administered 2022-05-31: 1 g via INTRAVENOUS
  Filled 2022-05-31: qty 10

## 2022-05-31 MED ORDER — ONDANSETRON 4 MG PO TBDP
4.0000 mg | ORAL_TABLET | Freq: Once | ORAL | Status: AC
  Administered 2022-05-31: 4 mg via ORAL
  Filled 2022-05-31: qty 1

## 2022-05-31 MED ORDER — LABETALOL HCL 5 MG/ML IV SOLN
10.0000 mg | Freq: Once | INTRAVENOUS | Status: AC
  Administered 2022-05-31: 10 mg via INTRAVENOUS
  Filled 2022-05-31: qty 4

## 2022-05-31 MED ORDER — HYDROCODONE-ACETAMINOPHEN 5-325 MG PO TABS
1.0000 | ORAL_TABLET | ORAL | Status: DC | PRN
  Administered 2022-05-31 – 2022-06-04 (×12): 2 via ORAL
  Filled 2022-05-31 (×12): qty 2

## 2022-05-31 MED ORDER — ACETAMINOPHEN 650 MG RE SUPP: 650.0000 mg | Freq: Four times a day (QID) | RECTAL | Status: DC | PRN

## 2022-05-31 MED ORDER — MORPHINE SULFATE (PF) 4 MG/ML IV SOLN
4.0000 mg | Freq: Once | INTRAVENOUS | Status: AC
  Administered 2022-05-31: 4 mg via INTRAVENOUS
  Filled 2022-05-31: qty 1

## 2022-05-31 MED ORDER — MELATONIN 5 MG PO TABS
10.0000 mg | ORAL_TABLET | Freq: Every day | ORAL | Status: DC
  Administered 2022-05-31 – 2022-06-03 (×4): 10 mg via ORAL
  Filled 2022-05-31 (×4): qty 2

## 2022-05-31 MED ORDER — TIZANIDINE HCL 4 MG PO TABS: 4.0000 mg | ORAL_TABLET | Freq: Three times a day (TID) | ORAL | Status: DC | PRN

## 2022-05-31 MED ORDER — PROCHLORPERAZINE EDISYLATE 10 MG/2ML IJ SOLN
5.0000 mg | Freq: Four times a day (QID) | INTRAMUSCULAR | Status: AC | PRN
  Administered 2022-05-31: 5 mg via INTRAVENOUS
  Filled 2022-05-31: qty 2

## 2022-05-31 MED ORDER — SODIUM CHLORIDE 0.9 % IV BOLUS
1000.0000 mL | Freq: Once | INTRAVENOUS | Status: AC
  Administered 2022-05-31: 1000 mL via INTRAVENOUS

## 2022-05-31 MED ORDER — ATORVASTATIN CALCIUM 10 MG PO TABS
10.0000 mg | ORAL_TABLET | Freq: Every day | ORAL | Status: DC
  Administered 2022-06-01 – 2022-06-04 (×4): 10 mg via ORAL
  Filled 2022-05-31 (×4): qty 1

## 2022-05-31 MED ORDER — ONDANSETRON HCL 4 MG/2ML IJ SOLN
4.0000 mg | Freq: Once | INTRAMUSCULAR | Status: AC
  Administered 2022-05-31: 4 mg via INTRAVENOUS
  Filled 2022-05-31: qty 2

## 2022-05-31 MED ORDER — CARVEDILOL 25 MG PO TABS
25.0000 mg | ORAL_TABLET | Freq: Two times a day (BID) | ORAL | Status: DC
  Administered 2022-06-01 – 2022-06-04 (×7): 25 mg via ORAL
  Filled 2022-05-31 (×7): qty 1

## 2022-05-31 MED ORDER — OXYCODONE-ACETAMINOPHEN 5-325 MG PO TABS
1.0000 | ORAL_TABLET | Freq: Once | ORAL | Status: AC
  Administered 2022-05-31: 1 via ORAL
  Filled 2022-05-31: qty 1

## 2022-05-31 NOTE — Assessment & Plan Note (Signed)
-   treat with Rocephin         await results of urine culture and adjust antibiotic coverage as needed  

## 2022-05-31 NOTE — Assessment & Plan Note (Signed)
-    evidence of acute renal failure due to presence of following: Cr increased >0.3 from likely secondary to dehydration,        check FeNA       Rehydrate with IV fluids

## 2022-05-31 NOTE — ED Notes (Signed)
Attempted labs, could not obtain at this time

## 2022-05-31 NOTE — ED Provider Triage Note (Signed)
-  Emergency Medicine Provider Triage Evaluation Note  Tamara Friedman , a 71 y.o. female  was evaluated in triage.  Pt complains of abdominal pain, nausea, vomiting for around a day, reports abdominal pain is localized around the right side, reports she is vomiting every 10 minutes, rates abdominal pain 8/10.  She denies any fever, chills, diarrhea, constipation.  Review of Systems  Positive: Abdominal pain, nausea, vomiting Negative: Fever, chills  Physical Exam  BP (!) 178/118 (BP Location: Left Arm)   Pulse (!) 105   Temp 98 F (36.7 C) (Oral)   Resp 16   SpO2 100%  Gen:   Awake, no distress   Resp:  Normal effort  MSK:   Moves extremities without difficulty  Other:  Patient with some generalized tenderness throughout, no focal rebound, rigidity, guarding on my exam  Medical Decision Making  Medically screening exam initiated at 11:17 AM.  Appropriate orders placed.  Tamara Friedman was informed that the remainder of the evaluation will be completed by another provider, this initial triage assessment does not replace that evaluation, and the importance of remaining in the ED until their evaluation is complete.  Workup initiated   Anselmo Pickler, [place] 05/31/22 1119

## 2022-05-31 NOTE — H&P (Signed)
Tamara Friedman:[id number] DOB: Dec 26, 1950 DOA: 05/31/2022     PCP: Lucianne Lei, MD   Outpatient Specialists:   CARDS:  Dr. Sarina Ser    Patient arrived to ER on 05/31/22 at 1041 Referred by Attending Toy Baker, MD   Patient coming from:    home Lives alone,    Chief Complaint:   Chief Complaint  Patient presents with   Emesis    HPI: Tamara Friedman is a 71 y.o. female with medical history significant of HLD,HTN, CHF, CKD, anemia  Presented with   nausea an vomiting  Reports nausea and vomiting for the past day no diarrhea Similar to prior symptoms when she had a UTI Denies fever but had frequency, urgency and haematuria    Reports occasional hematuria  No black stool no blood in stool  Does not smoke or drink Weight loss    Regarding pertinent Chronic problems:     Hyperlipidemia -  on statins Lipitor (atorvastatin)  Lipid Panel     Component Value Date/Time   CHOL  12/25/2007 0345    158        ATP III CLASSIFICATION:  <200     mg/dL   Desirable  200-239  mg/dL   Borderline High  >=240    mg/dL   High   TRIG 85 12/25/2007 0345   HDL 34 (L) 12/25/2007 0345   CHOLHDL 4.6 12/25/2007 0345   VLDL 17 12/25/2007 0345   LDLCALC (H) 12/25/2007 0345    107        Total Cholesterol/HDL:CHD Risk Coronary Heart Disease Risk Table                     Men   Women  1/2 Average Risk   3.4   3.3     HTN on NOrvasc, coreg,    chronic CHF diastolic/systolic/ combined - last echo 2022 EF 60-65   CKD stage IIIb- baseline Cr 1.5 CrCl cannot be calculated (Unknown ideal weight.).  Lab Results  Component Value Date   CREATININE 2.39 (H) 05/31/2022   CREATININE 1.52 (H) 05/11/2022   CREATININE 1.53 (H) 05/10/2022    Chronic anemia - baseline hg Hemoglobin & Hematocrit  Recent Labs    05/10/22 0337 05/11/22 0328 05/31/22 1456  HGB 9.1* 9.3* 12.2     While in ER:  Found to have UTI and started on rocephin    CTabd/pelvis -  nonacute, Large  sliding-type hiatal hernia.    Following Medications were ordered in ER: Medications  ondansetron (ZOFRAN-ODT) disintegrating tablet 4 mg (4 mg Oral Given 05/31/22 1749)  oxyCODONE-acetaminophen (PERCOCET/ROXICET) 5-325 MG per tablet 1 tablet (1 tablet Oral Given 05/31/22 1749)  ondansetron (ZOFRAN) injection 4 mg (4 mg Intravenous Given 05/31/22 1851)  sodium chloride 0.9 % bolus 1,000 mL (1,000 mLs Intravenous New Bag/Given 05/31/22 1850)  cefTRIAXone (ROCEPHIN) 1 g in sodium chloride 0.9 % 100 mL IVPB (1 g Intravenous New Bag/Given 05/31/22 1859)  morphine (PF) 4 MG/ML injection 4 mg (4 mg Intravenous Given 05/31/22 1853)  labetalol (NORMODYNE) injection 10 mg (10 mg Intravenous Given 05/31/22 1854)       ED Triage Vitals  Enc Vitals Group     BP 05/31/22 1053 (!) 178/118     Pulse Rate 05/31/22 1053 (!) 105     Resp 05/31/22 1053 16     Temp 05/31/22 1053 98 F (36.7 C)     Temp Source 05/31/22 1053 Oral  SpO2 05/31/22 1053 100 %     Weight --      Height --      Head Circumference --      Peak Flow --      Pain Score 05/31/22 1051 8     Pain Loc --      Pain Edu? --      Excl. in [place]? --   TMAX(24)@     _________________________________________ Significant initial  Findings: Abnormal Labs Reviewed  LIPASE, BLOOD - Abnormal; Notable for the following components:      Result Value   Lipase 67 (*)    All other components within normal limits  COMPREHENSIVE METABOLIC PANEL - Abnormal; Notable for the following components:   CO2 19 (*)    Glucose, Bld 133 (*)    BUN 35 (*)    Creatinine, Ser 2.39 (*)    Total Protein 8.8 (*)    GFR, Estimated 21 (*)    All other components within normal limits  CBC - Abnormal; Notable for the following components:   MCH 25.7 (*)    RDW 17.3 (*)    Platelets 480 (*)    All other components within normal limits  URINALYSIS, ROUTINE W REFLEX MICROSCOPIC - Abnormal; Notable for the following components:   APPearance CLOUDY (*)     Protein, ur >=300 (*)    Leukocytes,Ua LARGE (*)    WBC, UA >50 (*)    Bacteria, UA FEW (*)    All other components within normal limits      ECG: Ordered  The recent clinical data is shown below. Vitals:   05/31/22 1053 05/31/22 1432 05/31/22 1753 05/31/22 1915  BP: (!) 178/118 (!) 181/132 (!) 186/110 (!) 142/80  Pulse: (!) 105 (!) 101 91 79  Resp: '16 16 17 20  '$ Temp: 98 F (36.7 C) 98.1 F (36.7 C) 97.7 F (36.5 C)   TempSrc: Oral Oral Oral   SpO2: 100% 99% 100% 100%    WBC     Component Value Date/Time   WBC 7.9 05/31/2022 1456   LYMPHSABS 2.1 05/09/2022 0335   MONOABS 0.7 05/09/2022 0335   EOSABS 1.1 (H) 05/09/2022 0335   BASOSABS 0.0 05/09/2022 0335      UA  evidence of UTI      Urine analysis:    Component Value Date/Time   COLORURINE YELLOW 05/31/2022 1053   APPEARANCEUR CLOUDY (A) 05/31/2022 1053   LABSPEC 1.011 05/31/2022 1053   PHURINE 5.0 05/31/2022 [place] 05/31/2022 [place] 05/31/2022 [place] 05/31/2022 [place] 05/31/2022 1053   PROTEINUR >=300 (A) 05/31/2022 1053   UROBILINOGEN 0.2 07/19/2013 2044   NITRITE NEGATIVE 05/31/2022 1053   LEUKOCYTESUR LARGE (A) 05/31/2022 1053    Results for orders placed or performed during the hospital encounter of 05/06/22  Urine Culture     Status: Abnormal   Collection Time: 05/07/22  4:21 AM   Specimen: In/Out Cath Urine  Result Value Ref Range Status   Specimen Description   Final    IN/OUT CATH URINE Performed at [facility], [place] [address] [place], [place] [address]    Special Requests   Final    NONE Performed at [facility], [place] [address], [place], [place] [address]    Culture (A)  Final    >=100,000 COLONIES/mL ENTEROCOCCUS FAECIUM VANCOMYCIN RESISTANT ENTEROCOCCUS ISOLATED >=100,000 COLONIES/mL DIPHTHEROIDS(CORYNEBACTERIUM SPECIES) Standardized susceptibility testing  for this organism  is not available. Performed at [facility], [place] [address], [place], [place] [address]    Report Status 05/09/2022 FINAL  Final   Organism ID, Bacteria ENTEROCOCCUS FAECIUM (A)  Final      Susceptibility   Enterococcus faecium - MIC*    AMPICILLIN >=32 RESISTANT Resistant     NITROFURANTOIN 64 INTERMEDIATE Intermediate     VANCOMYCIN >=32 RESISTANT Resistant     LINEZOLID 2 SENSITIVE Sensitive     * >=100,000 COLONIES/mL ENTEROCOCCUS FAECIUM     _______________________________________________ Hospitalist was called for admission for   AKI (acute kidney injury) UTI  , dehydration  Nausea and vomiting in adult     The following Work up has been ordered so far:  Orders Placed This Encounter  Procedures   Urine Culture   CT ABDOMEN PELVIS WO CONTRAST   Lipase, blood   Comprehensive metabolic panel   CBC   Urinalysis, Routine w reflex microscopic   Diet NPO time specified   Cardiac monitoring   Consult to hospitalist   Place in observation (patient's expected length of stay will be less than 2 midnights)   Place in observation (patient's expected length of stay will be less than 2 midnights)     OTHER Significant initial  Findings:  labs showing:    Recent Labs  Lab 05/31/22 1456  NA 141  K 4.5  CO2 19*  GLUCOSE 133*  BUN 35*  CREATININE 2.39*  CALCIUM 10.3    Cr Up from baseline see below Lab Results  Component Value Date   CREATININE 2.39 (H) 05/31/2022   CREATININE 1.52 (H) 05/11/2022   CREATININE 1.53 (H) 05/10/2022    Recent Labs  Lab 05/31/22 1456  AST 17  ALT 17  ALKPHOS 105  BILITOT 0.8  PROT 8.8*  ALBUMIN 4.5   Lab Results  Component Value Date   CALCIUM 10.3 05/31/2022   PHOS 3.1 05/07/2022          Plt: Lab Results  Component Value Date   PLT 480 (H) 05/31/2022     Venous  Blood Gas result:  pH   7.35 Acid-base deficit 2.4 High  mmol/L  pCO2, Ven 42 Low  mmHg O2 Saturation 48.1 %  pO2, Ven 34 mmHg        Recent  Labs  Lab 05/31/22 1456  WBC 7.9  HGB 12.2  HCT 39.7  MCV 83.8  PLT 480*    HG/HCT   stable,     Component Value Date/Time   HGB 12.2 05/31/2022 1456   HCT 39.7 05/31/2022 1456   MCV 83.8 05/31/2022 1456      Recent Labs  Lab 05/31/22 1456  LIPASE 67*      Cardiac Panel (last 3 results) No results for input(s): "CKTOTAL", "CKMB", "TROPONINI", "RELINDX" in the last 72 hours.  .car BNP (last 3 results) Recent Labs    05/02/22 1138  BNP 93.6      DM  labs:  HbA1C: No results for input(s): "HGBA1C" in the last 8760 hours.     CBG (last 3)  No results for input(s): "GLUCAP" in the last 72 hours.        Cultures:    Component Value Date/Time   SDES  05/07/2022 0421    IN/OUT CATH URINE Performed at [facility] [address] [place], [place] [address]    SPECREQUEST  05/07/2022 0421    NONE Performed at [facility], [place] [address], [place], [place] [address]    CULT (A) 05/07/2022 0421    >=100,000 COLONIES/mL ENTEROCOCCUS FAECIUM VANCOMYCIN RESISTANT ENTEROCOCCUS ISOLATED >=100,000 COLONIES/mL DIPHTHEROIDS(CORYNEBACTERIUM SPECIES) Standardized susceptibility testing for this organism is not available. Performed at [facility], [place] [address] [place], [place] [address]    REPTSTATUS 05/09/2022 FINAL 05/07/2022 0421     Radiological Exams on Admission: CT ABDOMEN PELVIS WO CONTRAST  Result Date: 05/31/2022 CLINICAL DATA:  Acute right-sided abdominal pain. EXAM: CT ABDOMEN AND PELVIS WITHOUT CONTRAST TECHNIQUE: Multidetector CT imaging of the abdomen and pelvis was performed following the standard protocol without IV contrast. RADIATION DOSE REDUCTION: This exam was performed according to the departmental dose-optimization program which includes automated exposure control, adjustment of the mA and/or kV according to patient size and/or use of iterative reconstruction technique. COMPARISON:  May 07, 2022. FINDINGS: Lower chest: Visualized lung bases are unremarkable. Large sliding-type hiatal hernia is noted. Hepatobiliary: No focal liver abnormality is seen. No gallstones, gallbladder wall thickening, or biliary dilatation. Pancreas: Unremarkable. No pancreatic ductal dilatation or surrounding inflammatory changes. Spleen: Normal in size without focal abnormality. Adrenals/Urinary Tract: Adrenal glands are unremarkable. Stable left renal cysts are noted for which no further follow-up is required. No hydronephrosis or renal obstruction is noted. No renal or ureteral calculi are noted. Urinary bladder is unremarkable. Stomach/Bowel: Stomach is within normal limits. Appendix appears normal. No evidence of bowel wall thickening, distention, or inflammatory changes. Vascular/Lymphatic: Aortic atherosclerosis. No enlarged abdominal or pelvic lymph nodes. Reproductive: Status post hysterectomy. No adnexal masses. Other: No abdominal wall hernia or abnormality. No abdominopelvic ascites. Musculoskeletal: No acute or significant osseous findings. IMPRESSION: Large sliding-type hiatal hernia. No acute abnormality seen in the abdomen or pelvis. Aortic Atherosclerosis (ICD10-I70.0). Electronically Signed   By: Marijo Conception M.D.   On: 05/31/2022 17:01   _______________________________________________________________________________________________________ Latest  Blood pressure (!) 142/80, pulse 79, temperature 97.7 F (36.5 C), temperature source Oral, resp. rate 20, SpO2 100 %.   Vitals  labs and radiology finding personally reviewed  Review of Systems:    Pertinent positives include:  chills, fatigue abdominal pain, nausea, vomiting,  Constitutional:  No weight loss, night sweats, Fevers, , weight loss  HEENT:  No headaches, Difficulty swallowing,Tooth/dental problems,Sore throat,  No sneezing, itching, ear ache, nasal congestion, post nasal drip,  Cardio-vascular:  No chest pain, Orthopnea, PND,  anasarca, dizziness, palpitations.no Bilateral lower extremity swelling  GI:  No heartburn, indigestion,  diarrhea, change in bowel habits, loss of appetite, melena, blood in stool, hematemesis Resp:  no shortness of breath at rest. No dyspnea on exertion, No excess mucus, no productive cough, No non-productive cough, No coughing up of blood.No change in color of mucus.No wheezing. Skin:  no rash or lesions. No jaundice GU:  no dysuria, change in color of urine, no urgency or frequency. No straining to urinate.  No flank pain.  Musculoskeletal:  No joint pain or no joint swelling. No decreased range of motion. No back pain.  Psych:  No change in mood or affect. No depression or anxiety. No memory loss.  Neuro: no localizing neurological complaints, no tingling, no weakness, no double vision, no gait abnormality, no slurred speech, no confusion  All systems reviewed and apart from [place] all are negative _______________________________________________________________________________________________ Past Medical History:   Past Medical History:  Diagnosis Date   High cholesterol    Hypertension       Past Surgical History:  Procedure Laterality Date   PARTIAL  HYSTERECTOMY      Social History:  Ambulatory   walker      reports that she has never smoked. She has never used smokeless tobacco. She reports that she does not drink alcohol and does not use drugs.     Family History:   History reviewed. No pertinent family history. ______________________________________________________________________________________________ Allergies: No Known Allergies   Prior to Admission medications   Medication Sig Start Date End Date Taking? Authorizing Provider  albuterol (VENTOLIN HFA) 108 (90 Base) MCG/ACT inhaler Inhale 1-2 puffs into the lungs every 6 (six) hours as needed for wheezing or shortness of breath.    [provider]  amLODipine (NORVASC) 10 MG tablet Take 1  tablet (10 mg total) by mouth daily. 12/30/21   Geradine Girt, DO  aspirin 81 MG tablet Take 81 mg by mouth daily.     [provider]  atorvastatin (LIPITOR) 10 MG tablet Take 10 mg by mouth daily.    [provider]  carvedilol (COREG) 25 MG tablet Take 25 mg by mouth 2 (two) times daily with a meal.    [provider]  magnesium hydroxide (MILK OF MAGNESIA) 400 MG/5ML suspension Take 30 mLs by mouth daily as needed for mild constipation. 12/29/21   Eulogio Bear U, DO  MELATONIN PO Take 1 tablet by mouth at bedtime.    [provider]  Multiple Vitamins-Minerals (MULTIVITAMIN ADULTS) TABS Take 1 tablet by mouth daily after supper.    [provider]  ondansetron (ZOFRAN-ODT) 8 MG disintegrating tablet Take 1 tablet (8 mg total) by mouth every 8 (eight) hours as needed for nausea or vomiting. Patient not taking: Reported on 05/02/2022 11/30/21   Kathie Dike, MD  oxyCODONE (OXY IR/ROXICODONE) 5 MG immediate release tablet Take 1 tablet (5 mg total) by mouth every 6 (six) hours as needed for severe pain. 05/12/22   Kathie Dike, MD  polyethylene glycol (MIRALAX / GLYCOLAX) 17 g packet Take 17 g by mouth daily. Patient taking differently: Take 17 g by mouth daily as needed for mild constipation. 03/16/22   Bonnielee Haff, MD  tiZANidine (ZANAFLEX) 4 MG tablet Take 1 tablet (4 mg total) by mouth every 8 (eight) hours as needed for muscle spasms. 05/12/22   Kathie Dike, MD    ___________________________________________________________________________________________________ Physical Exam:    05/31/2022    7:15 PM 05/31/2022    5:53 PM 05/31/2022    2:32 PM  Vitals with BMI  Systolic 510 258 527  Diastolic 80 782 423  Pulse 79 91 101     1. General:  in No  Acute distress   Chronically ill  -appearing 2. Psychological: Alert and   Oriented 3. Head/ENT:    Dry Mucous Membranes                          Head Non traumatic, neck supple                            Poor Dentition 4. SKIN:  decreased Skin turgor,  Skin clean Dry and intact no rash 5. Heart: Regular rate and rhythm no  Murmur, no Rub or gallop 6. Lungs:  , no wheezes or crackles   7. Abdomen: Soft, suprapubic -tender, Non distended bowel sounds present 8. Lower extremities: no clubbing, cyanosis, no  edema 9. Neurologically Grossly intact, moving all 4 extremities equally   10. MSK: Normal range  of motion    Chart has been reviewed  ______________________________________________________________________________________________  Assessment/Plan  71 y.o. female with medical history significant of HLD,HTN, CHF, CKD, anemia   Admitted for   AKI (acute kidney injury)  UTI,  Nausea and vomiting in adult    Present on Admission:  UTI (urinary tract infection)  HTN (hypertension)  Dyslipidemia  Dehydration  Intractable vomiting with nausea  Acute renal failure superimposed on stage 4 chronic kidney disease (HCC)  AKI (acute kidney injury) ([place])  Acute pancreatitis     HTN (hypertension) Continue coreg 25 mpo bid  UTI (urinary tract infection)  - treat with Rocephin        await results of urine culture and adjust antibiotic coverage as needed   Dyslipidemia Continue lipitor 10 mg po qday  Chronic CHF ([place]) Gently rehydrate now appears dry  Dehydration rehydrate  Intractable vomiting with nausea Now improving with supportive measures    Acute renal failure superimposed on stage 4 chronic kidney disease ([place]) -  evidence of acute renal failure due to presence of following: Cr increased >0.3 from likely secondary to dehydration,        check FeNA       Rehydrate with IV fluids       Acute pancreatitis Due to her dehydration obtain electrolytes and rehydrate    Other plan as per orders.  DVT prophylaxis:  SCD     Code Status:  DNR/DNI  as per patient   I had personally discussed CODE STATUS with patient   Family Communication:    Family not at  Bedside    Disposition Plan:       To home once workup is complete and patient is stable   Following barriers for discharge:                            able to transition to PO antibiotics                             Will need to be able to tolerate PO                                                    Would benefit from PT/OT eval prior to DC  Ordered                                                       Transition of care consulted                   Nutrition    consulted                                     Consults called: none  Admission status:  ED Disposition     ED Disposition  Admit   Condition  --   [place]: [place] [[id number]]  Level of Care: Telemetry [5]  Admit to tele based on following criteria: Other see comments  Comments:  aki  May place patient in observation at [facility] or [place] if equivalent level of care is available:: No  Covid Evaluation: Asymptomatic - no recent exposure (last 10 days) testing not required  Diagnosis: UTI (urinary tract infection) [080223]  Admitting Physician: Toy Baker [[id number]]  Attending Physician: Toy Baker [[id number]]           Obs      Danilynn Jemison 05/31/2022, 10:47 PM    [facility]     after 2 AM please page floor coverage PA If 7AM-7PM, please contact the day team taking care of the patient using [email or web address]   Patient was evaluated in the context of the global COVID-19 pandemic, which necessitated consideration that the patient might be at risk for infection with the SARS-CoV-2 virus that causes COVID-19. Institutional protocols and algorithms that pertain to the evaluation of patients at risk for COVID-19 are in a state of rapid change based on information released by regulatory bodies including the CDC and federal and state organizations. These policies and algorithms were followed during the patient's care.

## 2022-05-31 NOTE — ED Triage Notes (Signed)
Patient said she has been vomiting for a day now. No diarrhea. Having right sided abdominal pain.

## 2022-05-31 NOTE — Assessment & Plan Note (Signed)
Due to her dehydration obtain electrolytes and rehydrate

## 2022-05-31 NOTE — ED Provider Notes (Signed)
[facility] DEPT Provider Note   CSN: [id number] Arrival date & time: 05/31/22  1041     History  Chief Complaint  Patient presents with   Emesis    Tamara Friedman is a 71 y.o. female with a past medical history of hypertension, recurrent cystitis, hypokalemia and stage IV chronic kidney disease presenting today with 2 days worth of abdominal pain, nausea and vomiting.  She reports that she has been unable to hold anything down, including water and her antihypertensive medications.  Localizes the pain to the right side of her abdomen, flank and back.  Also reports a couple days worth of urinary frequency and occasional hematuria.  No dysuria.   Emesis      Home Medications Prior to Admission medications   Medication Sig Start Date End Date Taking? Authorizing Provider  albuterol (VENTOLIN HFA) 108 (90 Base) MCG/ACT inhaler Inhale 1-2 puffs into the lungs every 6 (six) hours as needed for wheezing or shortness of breath.    [provider]  amLODipine (NORVASC) 10 MG tablet Take 1 tablet (10 mg total) by mouth daily. 12/30/21   Geradine Girt, DO  aspirin 81 MG tablet Take 81 mg by mouth daily.     [provider]  atorvastatin (LIPITOR) 10 MG tablet Take 10 mg by mouth daily.    [provider]  carvedilol (COREG) 25 MG tablet Take 25 mg by mouth 2 (two) times daily with a meal.    [provider]  magnesium hydroxide (MILK OF MAGNESIA) 400 MG/5ML suspension Take 30 mLs by mouth daily as needed for mild constipation. 12/29/21   Eulogio Bear U, DO  MELATONIN PO Take 1 tablet by mouth at bedtime.    [provider]  Multiple Vitamins-Minerals (MULTIVITAMIN ADULTS) TABS Take 1 tablet by mouth daily after supper.    [provider]  ondansetron (ZOFRAN-ODT) 8 MG disintegrating tablet Take 1 tablet (8 mg total) by mouth every 8 (eight) hours as needed for nausea or vomiting. Patient not taking:  Reported on 05/02/2022 11/30/21   Kathie Dike, MD  oxyCODONE (OXY IR/ROXICODONE) 5 MG immediate release tablet Take 1 tablet (5 mg total) by mouth every 6 (six) hours as needed for severe pain. 05/12/22   Kathie Dike, MD  polyethylene glycol (MIRALAX / GLYCOLAX) 17 g packet Take 17 g by mouth daily. Patient taking differently: Take 17 g by mouth daily as needed for mild constipation. 03/16/22   Bonnielee Haff, MD  tiZANidine (ZANAFLEX) 4 MG tablet Take 1 tablet (4 mg total) by mouth every 8 (eight) hours as needed for muscle spasms. 05/12/22   Kathie Dike, MD      Allergies    Patient has no known allergies.    Review of Systems   Review of Systems  Gastrointestinal:  Positive for vomiting.    Physical Exam Updated Vital Signs BP (!) 186/110 (BP Location: Left Arm)   Pulse 91   Temp 97.7 F (36.5 C) (Oral)   Resp 17   SpO2 100%  Physical Exam Vitals and nursing note reviewed.  Constitutional:      Appearance: Normal appearance.  HENT:     Head: Normocephalic and atraumatic.  Eyes:     General: No scleral icterus.    Conjunctiva/sclera: Conjunctivae normal.  Pulmonary:     Effort: Pulmonary effort is normal. No respiratory distress.  Abdominal:     General: Abdomen is flat.     Palpations: Abdomen is soft.  Tenderness: There is abdominal tenderness.     Comments: More generalized, no focal tenderness  Skin:    Findings: No rash.  Neurological:     Mental Status: She is alert.  Psychiatric:        Mood and Affect: Mood normal.     Comments: Anxious and crying on history taking     ED Results / Procedures / Treatments   Labs (all labs ordered are listed, but only abnormal results are displayed) Labs Reviewed  LIPASE, BLOOD - Abnormal; Notable for the following components:      Result Value   Lipase 67 (*)    All other components within normal limits  COMPREHENSIVE METABOLIC PANEL - Abnormal; Notable for the following components:   CO2 19 (*)     Glucose, Bld 133 (*)    BUN 35 (*)    Creatinine, Ser 2.39 (*)    Total Protein 8.8 (*)    GFR, Estimated 21 (*)    All other components within normal limits  CBC - Abnormal; Notable for the following components:   MCH 25.7 (*)    RDW 17.3 (*)    Platelets 480 (*)    All other components within normal limits  URINALYSIS, ROUTINE W REFLEX MICROSCOPIC - Abnormal; Notable for the following components:   APPearance CLOUDY (*)    Protein, ur >=300 (*)    Leukocytes,Ua LARGE (*)    WBC, UA >50 (*)    Bacteria, UA FEW (*)    All other components within normal limits  URINE CULTURE    EKG None  Radiology CT ABDOMEN PELVIS WO CONTRAST  Result Date: 05/31/2022 CLINICAL DATA:  Acute right-sided abdominal pain. EXAM: CT ABDOMEN AND PELVIS WITHOUT CONTRAST TECHNIQUE: Multidetector CT imaging of the abdomen and pelvis was performed following the standard protocol without IV contrast. RADIATION DOSE REDUCTION: This exam was performed according to the departmental dose-optimization program which includes automated exposure control, adjustment of the mA and/or kV according to patient size and/or use of iterative reconstruction technique. COMPARISON:  May 07, 2022. FINDINGS: Lower chest: Visualized lung bases are unremarkable. Large sliding-type hiatal hernia is noted. Hepatobiliary: No focal liver abnormality is seen. No gallstones, gallbladder wall thickening, or biliary dilatation. Pancreas: Unremarkable. No pancreatic ductal dilatation or surrounding inflammatory changes. Spleen: Normal in size without focal abnormality. Adrenals/Urinary Tract: Adrenal glands are unremarkable. Stable left renal cysts are noted for which no further follow-up is required. No hydronephrosis or renal obstruction is noted. No renal or ureteral calculi are noted. Urinary bladder is unremarkable. Stomach/Bowel: Stomach is within normal limits. Appendix appears normal. No evidence of bowel wall thickening, distention, or  inflammatory changes. Vascular/Lymphatic: Aortic atherosclerosis. No enlarged abdominal or pelvic lymph nodes. Reproductive: Status post hysterectomy. No adnexal masses. Other: No abdominal wall hernia or abnormality. No abdominopelvic ascites. Musculoskeletal: No acute or significant osseous findings. IMPRESSION: Large sliding-type hiatal hernia. No acute abnormality seen in the abdomen or pelvis. Aortic Atherosclerosis (ICD10-I70.0). Electronically Signed   By: Marijo Conception M.D.   On: 05/31/2022 17:01    Procedures Procedures   Medications Ordered in ED Medications  ondansetron (ZOFRAN) injection 4 mg (has no administration in time range)  sodium chloride 0.9 % bolus 1,000 mL (has no administration in time range)  cefTRIAXone (ROCEPHIN) 1 g in sodium chloride 0.9 % 100 mL IVPB (has no administration in time range)  morphine (PF) 4 MG/ML injection 4 mg (has no administration in time range)  labetalol (NORMODYNE) injection 10  mg (has no administration in time range)  ondansetron (ZOFRAN-ODT) disintegrating tablet 4 mg (4 mg Oral Given 05/31/22 1749)  oxyCODONE-acetaminophen (PERCOCET/ROXICET) 5-325 MG per tablet 1 tablet (1 tablet Oral Given 05/31/22 1749)    ED Course/ Medical Decision Making/ A&P                            Medical Decision Making Amount and/or Complexity of Data Reviewed Labs: ordered.  Risk Prescription drug management. Decision regarding hospitalization.   This is a 71 year old female presenting with abdominal pain, nausea, vomiting and urinary frequency.  Differential includes but is not limited to gastritis, cholecystitis, appendicitis, pancreatitis, gastroenteritis, medication side effect, pyelonephritis, UTI.     This is not an exhaustive differential.    Past Medical History / Co-morbidities / Social History: CKD   Additional history: Per chart review patient was discharged from the hospital 2 weeks ago after presenting in the exact same way.  She had  an acute cystitis, intractable nausea and vomiting as well as acute on chronic renal failure.  Creatinine was 2.5 when she was admitted, it is 2.4 today.   Physical Exam: Pertinent physical exam findings include Generalized abdominal tenderness Hypertensive with systolics as high as 415.  Reports she has been unable to keep down her medication for the duration of this illness  Lab Tests: I ordered, and personally interpreted labs.  The pertinent results include: Creatinine 2.39, her baseline appears to be around 1.5 GFR 21, usually in the 30s Urinalysis with bacteria, WBCs, leukocytes and proteinuria   Imaging Studies: CT abdomen pelvis ordered in triage.  I viewed and individually interpreted this and agree that there are no acute findings responsible for patient's symptoms.     Medications: Ordered Zofran, morphine, fluid bolus, Rocephin and labetalol.  Patient takes carvedilol at home.   MDM/Disposition: This is a 71 year old female presenting with abdominal pain, nausea, vomiting and an AKI on CKD.  She was here for similar earlier this month and she says it feels the exact same.  Urinalysis with signs of UTI.  Rocephin ordered.  Hypertensive and mildly tachycardic on arrival.  Given labetalol for her blood pressure and tachycardia as she usually takes carvedilol.  This brought down her blood pressure and heart rate.  At this time she requires admission for further management.  She is agreeable to this plan.  Admitted to Dr. Roel Cluck.  Final Clinical Impression(s) / ED Diagnoses Final diagnoses:  AKI (acute kidney injury) ([place])  Cystitis  Nausea and vomiting in adult    Rx / DC Orders ED Discharge Orders     None      Admit to Dr. Laney Potash, [place], PA-C 05/31/22 1948    Hayden Rasmussen, MD 06/01/22 1112

## 2022-05-31 NOTE — Subjective & Objective (Signed)
Reports nausea and vomiting for the past day no diarrhea Similar to prior symptoms when she had a UTI Denies fever but had frequency, urgency and haematuria

## 2022-05-31 NOTE — Assessment & Plan Note (Signed)
Continue lipitor 10 mg po qday 

## 2022-05-31 NOTE — Assessment & Plan Note (Signed)
Continue coreg 25 mpo bid

## 2022-05-31 NOTE — Assessment & Plan Note (Signed)
rehydrate

## 2022-05-31 NOTE — Assessment & Plan Note (Signed)
Gently rehydrate now appears dry

## 2022-05-31 NOTE — Assessment & Plan Note (Signed)
Now improving with supportive measures

## 2022-05-31 NOTE — ED Notes (Signed)
ED TO INPATIENT HANDOFF REPORT  ED Nurse Name and Phone #: Tonette Bihari [phone]    S Name/Age/Gender Tamara Friedman 71 y.o. female Room/Bed: WA14/WA14  Code Status   Code Status: Prior  Home/SNF/Other Home Patient oriented to: self, place, time, and situation Is this baseline? Yes   Triage Complete: Triage complete  Chief Complaint Pulmonary embolism ([facility] [place]) [I26.99] UTI (urinary tract infection) [N39.0]  Triage Note Patient said she has been vomiting for a day now. No diarrhea. Having right sided abdominal pain.    Allergies No Known Allergies  Level of Care/Admitting Diagnosis ED Disposition     ED Disposition  Admit   Condition  --   Comment  Hospital Area: [facility] [[id number]]  Level of Care: Telemetry [5]  Admit to tele based on following criteria: Other see comments  Comments: aki  May place patient in observation at [facility] or [facility] if equivalent level of care is available:: No  Covid Evaluation: Asymptomatic - no recent exposure (last 10 days) testing not required  Diagnosis: UTI (urinary tract infection) [211941]  Admitting Physician: [place], [place]  Attending Physician: Toy Baker [[id number]]          B Medical/Surgery History Past Medical History:  Diagnosis Date   High cholesterol    Hypertension    Past Surgical History:  Procedure Laterality Date   PARTIAL HYSTERECTOMY       A IV Location/Drains/Wounds Patient Lines/Drains/Airways Status     Active Line/Drains/Airways     Name Placement date Placement time Site Days   Peripheral IV 05/31/22 22 G Anterior;Proximal;Right Forearm 05/31/22  1850  Forearm  less than 1            Intake/Output Last 24 hours No intake or output data in the 24 hours ending 05/31/22 2028  Labs/Imaging Results for orders placed or performed during the hospital encounter of 05/31/22 (from the past 48 hour(s))  Urinalysis, Routine w reflex microscopic Urine, Clean  Catch     Status: Abnormal   Collection Time: 05/31/22 10:53 AM  Result Value Ref Range   Color, Urine YELLOW YELLOW   APPearance CLOUDY (A) CLEAR   Specific Gravity, Urine 1.011 1.005 - 1.030   pH 5.0 5.0 - 8.0   Glucose, UA NEGATIVE NEGATIVE mg/dL   Hgb urine dipstick NEGATIVE NEGATIVE   Bilirubin Urine NEGATIVE NEGATIVE   Ketones, ur NEGATIVE NEGATIVE mg/dL   Protein, ur >=300 (A) NEGATIVE mg/dL   Nitrite NEGATIVE NEGATIVE   Leukocytes,Ua LARGE (A) NEGATIVE   RBC / HPF 6-10 0 - 5 RBC/hpf   WBC, UA >50 (H) 0 - 5 WBC/hpf   Bacteria, UA FEW (A) NONE SEEN   Squamous Epithelial / LPF 0-5 0 - 5   WBC Clumps PRESENT    Mucus PRESENT     Comment: Performed at [facility], [place] [address] [place], [place] [address]  Lipase, blood     Status: Abnormal   Collection Time: 05/31/22  2:56 PM  Result Value Ref Range   Lipase 67 (H) 11 - 51 U/L    Comment: Performed at [facility], [place] [address] [place], [place] [address]  Comprehensive metabolic panel     Status: Abnormal   Collection Time: 05/31/22  2:56 PM  Result Value Ref Range   Sodium 141 135 - 145 mmol/L   Potassium 4.5 3.5 - 5.1 mmol/L   Chloride 109 98 - 111 mmol/L   CO2 19 (L)  22 - 32 mmol/L   Glucose, Bld 133 (H) 70 - 99 mg/dL    Comment: Glucose reference range applies only to samples taken after fasting for at least 8 hours.   BUN 35 (H) 8 - 23 mg/dL   Creatinine, Ser 2.39 (H) 0.44 - 1.00 mg/dL   Calcium 10.3 8.9 - 10.3 mg/dL   Total Protein 8.8 (H) 6.5 - 8.1 g/dL   Albumin 4.5 3.5 - 5.0 g/dL   AST 17 15 - 41 U/L   ALT 17 0 - 44 U/L   Alkaline Phosphatase 105 38 - 126 U/L   Total Bilirubin 0.8 0.3 - 1.2 mg/dL   GFR, Estimated 21 (L) >60 mL/min    Comment: (NOTE) Calculated using the CKD-EPI Creatinine Equation (2021)    Anion gap 13 5 - 15    Comment: Performed at [facility], [place] [address], [place], [place] [address]  CBC     Status: Abnormal    Collection Time: 05/31/22  2:56 PM  Result Value Ref Range   WBC 7.9 4.0 - 10.5 K/uL   RBC 4.74 3.87 - 5.11 MIL/uL   Hemoglobin 12.2 12.0 - 15.0 g/dL   HCT 39.7 36.0 - 46.0 %   MCV 83.8 80.0 - 100.0 fL   MCH 25.7 (L) 26.0 - 34.0 pg   MCHC 30.7 30.0 - 36.0 g/dL   RDW 17.3 (H) 11.5 - 15.5 %   Platelets 480 (H) 150 - 400 K/uL   nRBC 0.0 0.0 - 0.2 %    Comment: Performed at [facility], [place] [address], [place],  [address]   CT ABDOMEN PELVIS WO CONTRAST  Result Date: 05/31/2022 CLINICAL DATA:  Acute right-sided abdominal pain. EXAM: CT ABDOMEN AND PELVIS WITHOUT CONTRAST TECHNIQUE: Multidetector CT imaging of the abdomen and pelvis was performed following the standard protocol without IV contrast. RADIATION DOSE REDUCTION: This exam was performed according to the departmental dose-optimization program which includes automated exposure control, adjustment of the mA and/or kV according to patient size and/or use of iterative reconstruction technique. COMPARISON:  May 07, 2022. FINDINGS: Lower chest: Visualized lung bases are unremarkable. Large sliding-type hiatal hernia is noted. Hepatobiliary: No focal liver abnormality is seen. No gallstones, gallbladder wall thickening, or biliary dilatation. Pancreas: Unremarkable. No pancreatic ductal dilatation or surrounding inflammatory changes. Spleen: Normal in size without focal abnormality. Adrenals/Urinary Tract: Adrenal glands are unremarkable. Stable left renal cysts are noted for which no further follow-up is required. No hydronephrosis or renal obstruction is noted. No renal or ureteral calculi are noted. Urinary bladder is unremarkable. Stomach/Bowel: Stomach is within normal limits. Appendix appears normal. No evidence of bowel wall thickening, distention, or inflammatory changes. Vascular/Lymphatic: Aortic atherosclerosis. No enlarged abdominal or pelvic lymph nodes. Reproductive: Status post hysterectomy. No adnexal  masses. Other: No abdominal wall hernia or abnormality. No abdominopelvic ascites. Musculoskeletal: No acute or significant osseous findings. IMPRESSION: Large sliding-type hiatal hernia. No acute abnormality seen in the abdomen or pelvis. Aortic Atherosclerosis (ICD10-I70.0). Electronically Signed   By: Marijo Conception M.D.   On: 05/31/2022 17:01    Pending Labs Unresulted Labs (From admission, onward)     Start     Ordered   05/31/22 1957  Blood gas, venous  ONCE - STAT,   R       Question:  Release to patient  Answer:  Immediate   05/31/22 1956   05/31/22 1957  CBC with Differential/Platelet  Add-on,   AD  Question:  Release to patient  Answer:  Immediate   05/31/22 1956   05/31/22 1957  CK  Add-on,   AD        05/31/22 1956   05/31/22 1957  Creatinine, urine, random  Once,   R        05/31/22 1956   05/31/22 1957  Hepatic function panel  Add-on,   AD       Question:  Release to patient  Answer:  Immediate   05/31/22 1956   05/31/22 1957  Lactic acid, plasma  STAT Now then every 3 hours,   R (with STAT occurrences)     Question:  Release to patient  Answer:  Immediate   05/31/22 1956   05/31/22 1957  Magnesium  Add-on,   AD        05/31/22 1956   05/31/22 1957  Osmolality  Add-on,   AD        05/31/22 1956   05/31/22 1957  Phosphorus  Add-on,   AD        05/31/22 1956   05/31/22 1957  Sodium, urine, random  Once,   R        05/31/22 1956   05/31/22 1829  Urine Culture  Once,   URGENT       Question:  Indication  Answer:  Dysuria   05/31/22 1832   05/31/22 1053  Osmolality, urine  Once,   R        05/31/22 1053            Vitals/Pain Today's Vitals   05/31/22 1753 05/31/22 1900 05/31/22 1915 05/31/22 2000  BP: (!) 186/110  (!) 142/80 (!) 158/83  Pulse: 91  79 84  Resp: '17  20 15  '$ Temp: 97.7 F (36.5 C)     TempSrc: Oral     SpO2: 100%  100% 100%  PainSc: 8  8       Isolation Precautions No active isolations  Medications Medications  ondansetron  (ZOFRAN-ODT) disintegrating tablet 4 mg (4 mg Oral Given 05/31/22 1749)  oxyCODONE-acetaminophen (PERCOCET/ROXICET) 5-325 MG per tablet 1 tablet (1 tablet Oral Given 05/31/22 1749)  ondansetron (ZOFRAN) injection 4 mg (4 mg Intravenous Given 05/31/22 1851)  sodium chloride 0.9 % bolus 1,000 mL (1,000 mLs Intravenous New Bag/Given 05/31/22 1850)  cefTRIAXone (ROCEPHIN) 1 g in sodium chloride 0.9 % 100 mL IVPB (1 g Intravenous New Bag/Given 05/31/22 1859)  morphine (PF) 4 MG/ML injection 4 mg (4 mg Intravenous Given 05/31/22 1853)  labetalol (NORMODYNE) injection 10 mg (10 mg Intravenous Given 05/31/22 1854)    Mobility walks Moderate fall risk   Focused Assessments    R Recommendations: See Admitting Provider Note  Report given to:   Additional Notes:

## 2022-06-01 ENCOUNTER — Inpatient Hospital Stay (HOSPITAL_COMMUNITY): Payer: Medicare Other

## 2022-06-01 DIAGNOSIS — N390 Urinary tract infection, site not specified: Secondary | ICD-10-CM | POA: Diagnosis not present

## 2022-06-01 DIAGNOSIS — Z90711 Acquired absence of uterus with remaining cervical stump: Secondary | ICD-10-CM | POA: Diagnosis not present

## 2022-06-01 DIAGNOSIS — N179 Acute kidney failure, unspecified: Secondary | ICD-10-CM | POA: Insufficient documentation

## 2022-06-01 DIAGNOSIS — E7800 Pure hypercholesterolemia, unspecified: Secondary | ICD-10-CM | POA: Diagnosis present

## 2022-06-01 DIAGNOSIS — N3001 Acute cystitis with hematuria: Secondary | ICD-10-CM | POA: Diagnosis present

## 2022-06-01 DIAGNOSIS — E440 Moderate protein-calorie malnutrition: Secondary | ICD-10-CM | POA: Insufficient documentation

## 2022-06-01 DIAGNOSIS — Z7982 Long term (current) use of aspirin: Secondary | ICD-10-CM | POA: Diagnosis not present

## 2022-06-01 DIAGNOSIS — Z23 Encounter for immunization: Secondary | ICD-10-CM | POA: Diagnosis present

## 2022-06-01 DIAGNOSIS — E872 Acidosis, unspecified: Secondary | ICD-10-CM | POA: Diagnosis present

## 2022-06-01 DIAGNOSIS — N184 Chronic kidney disease, stage 4 (severe): Secondary | ICD-10-CM | POA: Diagnosis present

## 2022-06-01 DIAGNOSIS — Z79899 Other long term (current) drug therapy: Secondary | ICD-10-CM | POA: Diagnosis not present

## 2022-06-01 DIAGNOSIS — I5042 Chronic combined systolic (congestive) and diastolic (congestive) heart failure: Secondary | ICD-10-CM | POA: Diagnosis present

## 2022-06-01 DIAGNOSIS — D631 Anemia in chronic kidney disease: Secondary | ICD-10-CM | POA: Diagnosis present

## 2022-06-01 DIAGNOSIS — Z66 Do not resuscitate: Secondary | ICD-10-CM | POA: Diagnosis present

## 2022-06-01 DIAGNOSIS — K449 Diaphragmatic hernia without obstruction or gangrene: Secondary | ICD-10-CM | POA: Diagnosis present

## 2022-06-01 DIAGNOSIS — E860 Dehydration: Secondary | ICD-10-CM | POA: Diagnosis present

## 2022-06-01 DIAGNOSIS — R748 Abnormal levels of other serum enzymes: Secondary | ICD-10-CM | POA: Diagnosis present

## 2022-06-01 DIAGNOSIS — I130 Hypertensive heart and chronic kidney disease with heart failure and stage 1 through stage 4 chronic kidney disease, or unspecified chronic kidney disease: Secondary | ICD-10-CM | POA: Diagnosis present

## 2022-06-01 DIAGNOSIS — Z79890 Hormone replacement therapy (postmenopausal): Secondary | ICD-10-CM | POA: Diagnosis not present

## 2022-06-01 DIAGNOSIS — E876 Hypokalemia: Secondary | ICD-10-CM | POA: Diagnosis present

## 2022-06-01 DIAGNOSIS — B9620 Unspecified Escherichia coli [E. coli] as the cause of diseases classified elsewhere: Secondary | ICD-10-CM | POA: Diagnosis present

## 2022-06-01 DIAGNOSIS — R000 Tachycardia, unspecified: Secondary | ICD-10-CM | POA: Diagnosis present

## 2022-06-01 LAB — CBC
HCT: 33.5 % — ABNORMAL LOW (ref 36.0–46.0)
Hemoglobin: 10 g/dL — ABNORMAL LOW (ref 12.0–15.0)
MCH: 25.8 pg — ABNORMAL LOW (ref 26.0–34.0)
MCHC: 29.9 g/dL — ABNORMAL LOW (ref 30.0–36.0)
MCV: 86.3 fL (ref 80.0–100.0)
Platelets: 322 10*3/uL (ref 150–400)
RBC: 3.88 MIL/uL (ref 3.87–5.11)
RDW: 17.5 % — ABNORMAL HIGH (ref 11.5–15.5)
WBC: 9.5 10*3/uL (ref 4.0–10.5)
nRBC: 0 % (ref 0.0–0.2)

## 2022-06-01 LAB — MAGNESIUM: Magnesium: 2.2 mg/dL (ref 1.7–2.4)

## 2022-06-01 LAB — OSMOLALITY, URINE: Osmolality, Ur: 367 mOsm/kg (ref 300–900)

## 2022-06-01 LAB — COMPREHENSIVE METABOLIC PANEL
ALT: 12 U/L (ref 0–44)
AST: 14 U/L — ABNORMAL LOW (ref 15–41)
Albumin: 3.3 g/dL — ABNORMAL LOW (ref 3.5–5.0)
Alkaline Phosphatase: 71 U/L (ref 38–126)
Anion gap: 10 (ref 5–15)
BUN: 42 mg/dL — ABNORMAL HIGH (ref 8–23)
CO2: 19 mmol/L — ABNORMAL LOW (ref 22–32)
Calcium: 8.5 mg/dL — ABNORMAL LOW (ref 8.9–10.3)
Chloride: 113 mmol/L — ABNORMAL HIGH (ref 98–111)
Creatinine, Ser: 2.86 mg/dL — ABNORMAL HIGH (ref 0.44–1.00)
GFR, Estimated: 17 mL/min — ABNORMAL LOW (ref >60)
Glucose, Bld: 95 mg/dL (ref 70–99)
Potassium: 3.9 mmol/L (ref 3.5–5.1)
Sodium: 142 mmol/L (ref 135–145)
Total Bilirubin: 1 mg/dL (ref 0.3–1.2)
Total Protein: 6.3 g/dL — ABNORMAL LOW (ref 6.5–8.1)

## 2022-06-01 LAB — PHOSPHORUS: Phosphorus: 5.3 mg/dL — ABNORMAL HIGH (ref 2.5–4.6)

## 2022-06-01 LAB — OSMOLALITY: Osmolality: 311 mOsm/kg — ABNORMAL HIGH (ref 275–295)

## 2022-06-01 LAB — LIPASE, BLOOD: Lipase: 106 U/L — ABNORMAL HIGH (ref 11–51)

## 2022-06-01 MED ORDER — SODIUM CHLORIDE 0.9 % IV BOLUS
500.0000 mL | Freq: Once | INTRAVENOUS | Status: AC
  Administered 2022-06-01: 500 mL via INTRAVENOUS

## 2022-06-01 MED ORDER — SODIUM CHLORIDE 0.9 % IV SOLN: INTRAVENOUS | Status: DC

## 2022-06-01 MED ORDER — BOOST / RESOURCE BREEZE PO LIQD CUSTOM
1.0000 | Freq: Two times a day (BID) | ORAL | Status: DC
  Administered 2022-06-02 – 2022-06-04 (×3): 1 via ORAL

## 2022-06-01 MED ORDER — TIZANIDINE HCL 4 MG PO TABS: 2.0000 mg | ORAL_TABLET | Freq: Every day | ORAL | Status: DC | PRN

## 2022-06-01 NOTE — Progress Notes (Signed)
OT Cancellation Note  Patient Details Name: Tamara Friedman MRN: [id number] DOB: [birth year]   Cancelled Treatment:    Reason Eval/Treat Not Completed: Other (comment). OT evaluation attempted, however pt currently getting an ultrasound in her room.    [name], OTR/L 06/01/2022, 4:55 PM

## 2022-06-01 NOTE — Hospital Course (Addendum)
71 year old female with hypertension recurrent cystitis hypokalemia, CHF, hyperlipidemia stage IV CKDIIIb b/l creat~1.5 05/11/22 admitted with working diagnosis of intractable nausea vomiting dehydration and AKI  She presented to ED 10/31 with nausea and vomiting x1 day without diarrhea, pain similar complaints in the setting of UTI, with occasional hematuria.  In the ED blood pressure 172-180s heart rate 105 Labs bicarb 20 creatinine 2.3, lipase 67 stable CBC UA WBC more than 50 LE large nitrate negative, CT abdomen pelvis without contrast large sliding-type hiatal hernia. Diet was advanced seen by nephrology managed with IV fluids, creatinine improved to baseline,

## 2022-06-01 NOTE — Progress Notes (Signed)
PROGRESS NOTE MARGIA WIESEN  IRJ:[id number] DOB: [birth year] DOA: 05/31/2022 PCP: Lucianne Lei, MD   Brief Narrative/Hospital Course: 71 year old female with hypertension recurrent cystitis hypokalemia, CHF, hyperlipidemia stage IV CKDIIIb b/l creat~1.5 05/11/22 admitted with working diagnosis of intractable nausea vomiting dehydration and AKI  She presented to ED 10/31 with nausea and vomiting x1 day without diarrhea, pain similar complaints in the setting of UTI, with occasional hematuria.  In the ED blood pressure 172-180s heart rate 105 Labs bicarb 20 creatinine 2.3, lipase 67 stable CBC UA WBC more than 50 LE large nitrate negative, CT abdomen pelvis without contrast large sliding-type hiatal hernia.    Subjective: Seen examined C/o pain all over No more nausea vomiting since coming to floor Reports voiding in bathroom- 2 times this am Overnight Tmax 98.9 BP 130s to 160s, 100% on room air Labs with creatinine  1.5> 2.3>2.8 BUN 22>25>42  bicarb 19 No chest pain fever or chills.  Assessment and Plan: Principal Problem:   UTI (urinary tract infection) Active Problems:   Intractable vomiting with nausea   Acute renal failure superimposed on stage 4 chronic kidney disease (HCC)   HTN (hypertension)   Chronic CHF (HCC)   Acute pancreatitis   Dehydration   Dyslipidemia   AKI (acute kidney injury) ([place])   Malnutrition of moderate degree   AKI on [id number] Metabolic acidosis Dehydration: B/l bun/creat as below ~1.5> recent AKI up to 2.5 on 10/6.  In the setting of intractable nausea vomiting poor intake.  Being managed with IV fluid hydration. Continue to monitor renal function, check  strict urine output, avoid diuretics/hypotension nephrotoxic medication.Renally dose all meds-consulted pharmacy.  Nephrology consulted.  Continue clear liquid diet Recent Labs    03/15/22 0426 03/16/22 0441 05/02/22 1138 05/06/22 1827 05/07/22 0707 05/07/22 1222 05/09/22 0335 05/10/22 0337  05/11/22 0328 05/31/22 1456 06/01/22 0441  BUN 30* 26* 34* 50*  --  43* '23 17 22 '$ 35* 42*  CREATININE 1.70* 1.62* 1.58* 2.50* 2.40* 2.03* 1.78* 1.53* 1.52* 2.39* 2.86*    Hyperphosphatemia on CKD.  Monitor Intractable nausea vomiting-CT showing large sliding type hiatal hernia.  Placed on clear liquid diet> so far no nausea vomiting on the floor continue the same and advance slowly Elevated lipase, appears chronically elevated? Less likely acute pancreatitis- Ct abdomen with  no acute finding-could be from dehydration.Repeat lipsae in 100- previosuly it has been in 60-100s  Hypertension:BP is stable, continue to hold amlodipine,continue coreg UTI: Placed on ceftriaxone HLD on statin Chronic systolic CHF hypovolemic currently, no signs of fluid overload monitor BMP daily weight net balance Net IO Since Admission: 934 mL [06/01/22 1414]  No results for input(s): "PROBNP" in the last 168 hours.   Moderate malnutrition augment diet as below Nutrition Problem: Moderate Malnutrition Etiology: acute illness Signs/Symptoms: mild fat depletion, mild muscle depletion, edema Interventions: Boost Breeze    DVT prophylaxis: SCDs Start: 05/31/22 2142 Code Status:   Code Status: DNR Family Communication: plan of care discussed with patient at bedside. Patient status is: Admitted under observation but will need inpatient because of acute renal failure Level of care: Telemetry   Dispo: The patient is from: lives with herself, grandson checks on herElita Quick.  Current activity level is 5.            Anticipated disposition: TBD  Objective: Vitals last 24 hrs: Vitals:   06/01/22 0156 06/01/22 0545 06/01/22 0950 06/01/22 1404  BP: (!) 140/75 136/75 (!) 155/83 127/72  Pulse: 60 75 63 70  Resp: '20 20 18 18  '$ Temp: 98.4 F (36.9 C) 98.9 F (37.2 C) 98 F (36.7 C) 98.5 F (36.9 C)  TempSrc: Oral Oral Oral Oral  SpO2: 100% 100% 100% 100%  Weight:      Height:       Weight change:   Physical  Examination: General exam: alert awake, older than stated age HEENT:Oral mucosa moist, Ear/Nose WNL grossly Respiratory system: bilaterally clear BS, no use of accessory muscle Cardiovascular system: S1 & S2 +, No JVD. Gastrointestinal system: Abdomen soft, mild generalized tenderness present ,ND, BS+ Nervous System:Alert, awake, moving extremities. Extremities: LE edema neg,distal peripheral pulses palpable.  Skin: No rashes,no icterus. MSK: Normal muscle bulk,tone, power  Medications reviewed:  Scheduled Meds:  aspirin  81 mg Oral Daily   atorvastatin  10 mg Oral Daily   carvedilol  25 mg Oral BID WC   feeding supplement  1 Container Oral BID BM   melatonin  10 mg Oral QHS   pneumococcal 20-valent conjugate vaccine  0.5 mL Intramuscular Tomorrow-1000   Continuous Infusions:  sodium chloride 100 mL/hr at 06/01/22 1143   cefTRIAXone (ROCEPHIN)  IV        Diet Order             Diet clear liquid Room service appropriate? Yes; Fluid consistency: Thin  Diet effective now                   Intake/Output Summary (Last 24 hours) at 06/01/2022 1414 Last data filed at 06/01/2022 1248 Gross per 24 hour  Intake 934 ml  Output --  Net 934 ml   Net IO Since Admission: 934 mL [06/01/22 1414]  Wt Readings from Last 3 Encounters:  06/01/22 73.9 kg  05/07/22 72.6 kg  03/12/22 72.6 kg    Unresulted Labs (From admission, onward)     Start     Ordered   06/02/22 3151  Basic metabolic panel  Daily at 5am,   R     Question:  Specimen collection method  Answer:  Lab=Lab collect   06/01/22 0856   06/02/22 0500  CBC  Daily at 5am,   R     Question:  Specimen collection method  Answer:  Lab=Lab collect   06/01/22 0856   06/02/22 0500  Lipase, blood  Tomorrow morning,   R       Question:  Specimen collection method  Answer:  Lab=Lab collect   06/01/22 1122   06/02/22 0500  Brain natriuretic peptide  Daily at 5am,   R     Question:  Specimen collection method  Answer:  Lab=Lab  collect   06/01/22 1411   05/31/22 1829  Urine Culture  Once,   URGENT       Question:  Indication  Answer:  Dysuria   05/31/22 1832          Data Reviewed: I have personally reviewed following labs and imaging studies CBC: Recent Labs  Lab 05/31/22 1456 05/31/22 2238 06/01/22 0441  WBC 7.9 8.8 9.5  NEUTROABS  --  6.5  --   HGB 12.2 11.1* 10.0*  HCT 39.7 37.3 33.5*  MCV 83.8 85.9 86.3  PLT 480* 339 761   Basic Metabolic Panel: Recent Labs  Lab 05/31/22 1456 05/31/22 2238 06/01/22 0441  NA 141  --  142  K 4.5  --  3.9  CL 109  --  113*  CO2 19*  --  19*  GLUCOSE 133*  --  95  BUN 35*  --  42*  CREATININE 2.39*  --  2.86*  CALCIUM 10.3  --  8.5*  MG  --  2.4 2.2  PHOS  --  4.9* 5.3*   GFR: Estimated Creatinine Clearance: 18 mL/min (A) (by C-G formula based on SCr of 2.86 mg/dL (H)). Liver Function Tests: Recent Labs  Lab 05/31/22 1456 05/31/22 2238 06/01/22 0441  AST 17 17 14*  ALT '17 14 12  '$ ALKPHOS 105 87 71  BILITOT 0.8 0.7 1.0  PROT 8.8* 7.5 6.3*  ALBUMIN 4.5 3.9 3.3*   Recent Labs  Lab 05/31/22 1456 06/01/22 0441  LIPASE 67* 106*   Recent Labs  Lab 05/31/22 2019 05/31/22 2238  LATICACIDVEN 1.7 1.6   No results found for this or any previous visit (from the past 240 hour(s)).  Antimicrobials: Anti-infectives (From admission, onward)    Start     Dose/Rate Route Frequency Ordered Stop   06/01/22 1800  cefTRIAXone (ROCEPHIN) 1 g in sodium chloride 0.9 % 100 mL IVPB        1 g 200 mL/hr over 30 Minutes Intravenous Every 24 hours 05/31/22 2142     05/31/22 1845  cefTRIAXone (ROCEPHIN) 1 g in sodium chloride 0.9 % 100 mL IVPB        1 g 200 mL/hr over 30 Minutes Intravenous  Once 05/31/22 1832 06/01/22 0820      Culture/Microbiology  Radiology Studies: CT ABDOMEN PELVIS WO CONTRAST  Result Date: 05/31/2022 CLINICAL DATA:  Acute right-sided abdominal pain. EXAM: CT ABDOMEN AND PELVIS WITHOUT CONTRAST TECHNIQUE: Multidetector CT  imaging of the abdomen and pelvis was performed following the standard protocol without IV contrast. RADIATION DOSE REDUCTION: This exam was performed according to the departmental dose-optimization program which includes automated exposure control, adjustment of the mA and/or kV according to patient size and/or use of iterative reconstruction technique. COMPARISON:  May 07, 2022. FINDINGS: Lower chest: Visualized lung bases are unremarkable. Large sliding-type hiatal hernia is noted. Hepatobiliary: No focal liver abnormality is seen. No gallstones, gallbladder wall thickening, or biliary dilatation. Pancreas: Unremarkable. No pancreatic ductal dilatation or surrounding inflammatory changes. Spleen: Normal in size without focal abnormality. Adrenals/Urinary Tract: Adrenal glands are unremarkable. Stable left renal cysts are noted for which no further follow-up is required. No hydronephrosis or renal obstruction is noted. No renal or ureteral calculi are noted. Urinary bladder is unremarkable. Stomach/Bowel: Stomach is within normal limits. Appendix appears normal. No evidence of bowel wall thickening, distention, or inflammatory changes. Vascular/Lymphatic: Aortic atherosclerosis. No enlarged abdominal or pelvic lymph nodes. Reproductive: Status post hysterectomy. No adnexal masses. Other: No abdominal wall hernia or abnormality. No abdominopelvic ascites. Musculoskeletal: No acute or significant osseous findings. IMPRESSION: Large sliding-type hiatal hernia. No acute abnormality seen in the abdomen or pelvis. Aortic Atherosclerosis (ICD10-I70.0). Electronically Signed   By: Marijo Conception M.D.   On: 05/31/2022 17:01     LOS: 0 days   Antonieta Pert, MD [facility]  06/01/2022, 2:14 PM

## 2022-06-01 NOTE — Evaluation (Signed)
Physical Therapy Evaluation Patient Details Name: Tamara Friedman MRN: [id number] DOB: [birth year] Today's Date: 06/01/2022  History of Present Illness  71-year-old female with hypertension recurrent cystitis hypokalemia, CHF, hyperlipidemia stage IV CKD admitted 05/31/22 with working diagnosis of intractable nausea vomiting dehydration and AKI  Clinical Impression   Patient admitted for above medical problems, recently in [facility] for similar medical issues.  Patient ambulated in room  using RW. Encouraged sitting in recliner, patient declined.  Patient  reports family checks on her daily, performs self ADLs independently, uses RW at  baseline. Patient  should progress to return home with family support.  Pt admitted with above diagnosis.  Pt currently with functional limitations due to the deficits listed below (see PT Problem List). Pt will benefit from skilled PT to increase their independence and safety with mobility to allow discharge to the venue listed below.        Recommendations for follow up therapy are one component of a multi-disciplinary discharge planning process, led by the attending physician.  Recommendations may be updated based on patient status, additional functional criteria and insurance authorization.  Follow Up Recommendations Home health PT, intermittent  family support      Assistance Recommended at Discharge Frequent or constant Supervision/Assistance  Patient can return home with the following  A little help with walking and/or transfers;A little help with bathing/dressing/bathroom;Assistance with cooking/housework;Assist for transportation;Help with stairs or ramp for entrance    Equipment Recommendations None recommended by PT  Recommendations for Other Services       Functional Status Assessment Patient has had a recent decline in their functional status and demonstrates the ability to make significant improvements in function in a reasonable and predictable  amount of time.     Precautions / Restrictions Precautions Precautions: Fall Precaution Comments: pain from recent fall, VRE/contact Restrictions Weight Bearing Restrictions: No      Mobility  Bed Mobility Overal bed mobility: Needs Assistance Bed Mobility: Supine to Sit     Supine to sit: Supervision     General bed mobility comments: no assist. HOB raised    Transfers Overall transfer level: Needs assistance Equipment used: Rolling walker (2 wheels) Transfers: Sit to/from Stand Sit to Stand: Min guard           General transfer comment: extra time  to rise, wide base    Ambulation/Gait Ambulation/Gait assistance: Min guard Gait Distance (Feet): 30 Feet Assistive device: Rolling walker (2 wheels) Gait Pattern/deviations: Step-to pattern, Step-through pattern Gait velocity: decreased     General Gait Details: cues for safety around room using RW ,objects in way, able to stand while furniture moved, able to move around Advertising copywriter    Modified Rankin (Stroke Patients Only)       Balance Overall balance assessment: Needs assistance Sitting-balance support: No upper extremity supported, Feet supported Sitting balance-Leahy Scale: Fair Sitting balance - Comments: asked therapist to don socks   Standing balance support: During functional activity, Reliant on assistive device for balance, Bilateral upper extremity supported Standing balance-Leahy Scale: Fair                               Pertinent Vitals/Pain Pain Assessment Faces Pain Scale: Hurts a little bit Pain Location: both feet Pain Descriptors / Indicators: Discomfort Pain Intervention(s): Monitored during session, Limited activity within patient's tolerance    Home  Living Family/patient expects to be discharged to:: Private residence Living Arrangements: Alone Available Help at Discharge: Family;Available 24 hours/day Type of Home:  Apartment Home Access: Stairs to enter Entrance Stairs-Rails: None Entrance Stairs-Number of Steps: 1   Home Layout: One level Home Equipment: Conservation officer, nature (2 wheels);Cane - single point      Prior Function Prior Level of Function : Independent/Modified Independent             Mobility Comments: does not drive, grandson assists with groceries and transportation, reports using RW ADLs Comments: independent with ADLs,     Hand Dominance   Dominant Hand: Right    Extremity/Trunk Assessment   Upper Extremity Assessment Upper Extremity Assessment: Overall WFL for tasks assessed    Lower Extremity Assessment Lower Extremity Assessment: Generalized weakness (some ankle/foot deformity)    Cervical / Trunk Assessment Cervical / Trunk Assessment: Normal  Communication   Communication: No difficulties  Cognition Arousal/Alertness: Awake/alert Behavior During Therapy: WFL for tasks assessed/performed, Flat affect Overall Cognitive Status: Within Functional Limits for tasks assessed                                          General Comments      Exercises     Assessment/Plan    PT Assessment Patient needs continued PT services  PT Problem List Decreased strength;Decreased mobility;Decreased activity tolerance;Decreased balance;Decreased knowledge of use of DME;Pain;Decreased safety awareness;Decreased cognition       PT Treatment Interventions DME instruction;Gait training;Therapeutic activities;Therapeutic exercise;Patient/family education;Balance training;Functional mobility training    PT Goals (Current goals can be found in the Care Plan section)  Acute Rehab PT Goals Patient Stated Goal: none stated PT Goal Formulation: With patient Time For Goal Achievement: 06/15/22 Potential to Achieve Goals: Good    Frequency Min 3X/week     Co-evaluation               AM-PAC PT "6 Clicks" Mobility  Outcome Measure                   End of Session   Activity Tolerance: Patient limited by fatigue Patient left: in bed;with call bell/phone within reach;with nursing/sitter in room Nurse Communication: Mobility status PT Visit Diagnosis: Muscle weakness (generalized) (M62.81);Difficulty in walking, not elsewhere classified (R26.2);Pain    Time: 5643-3295 PT Time Calculation (min) (ACUTE ONLY): 14 min   Charges:   PT Evaluation $PT Eval Low Complexity: 1 Low          [place] Office [phone] Weekend [phone]   Claretha Cooper 06/01/2022, 2:36 PM

## 2022-06-01 NOTE — Plan of Care (Signed)
  Problem: Education: Goal: Knowledge of General Education information will improve Description Including pain rating scale, medication(s)/side effects and non-pharmacologic comfort measures Outcome: Progressing   Problem: Health Behavior/Discharge Planning: Goal: Ability to manage health-related needs will improve Outcome: Progressing   

## 2022-06-01 NOTE — Progress Notes (Signed)
Initial Nutrition Assessment  DOCUMENTATION CODES:   Non-severe (moderate) malnutrition in context of acute illness/injury  INTERVENTION:  -Advance diet as clinically appropriate -Consider anti-emetics prior to po intake as diet is advanced -Provide Boost Breeze BID to provide 250kcal and 9g protein/bottle.  NUTRITION DIAGNOSIS:  Moderate Malnutrition related to acute illness as evidenced by mild fat depletion, mild muscle depletion, edema.  GOAL:  Patient will meet greater than or equal to 90% of their needs  MONITOR:  PO intake, Supplement acceptance, Diet advancement  REASON FOR ASSESSMENT:  Consult Assessment of nutrition requirement/status  ASSESSMENT:  Pt is a 71yo F with PMH of HLD,HTN, CHF, CKD, and anemia who presents with n/v and weight loss due to UTI.  Visited pt at bedside this morning. She reports no po intake x 3 days. Currently on clear liquid diet with good tolerance but continued complaints of nausea. Pt reports weight loss in the last week with poor intake but unable to quantify how much. NFPE revealed mild fat loss, mild to moderate muscle wasting and mild to moderate edema. Pt also c/o weakness. Pt meets ASPEN criteria for moderate protein calorie malnutrition.  Discussed CLD. Pt does not feel up to diet advancement. Discussed ONS. Pt does not tolerate regular Boost and Ensure but is willing to try Boost Breeze. Recommend Boost Breeze BID to provide 250kcal and 9g protein.  Medications reviewed and include: aspirin, lipitor, coreg, NS '@100mL'$ /hr, prn norco/vicodin  Labs reviewed: BUN:42, Cr:2.86, Phos:5.3, AST:14, GFR:17  NUTRITION - FOCUSED PHYSICAL EXAM:  Flowsheet Row Most Recent Value  Orbital Region Mild depletion  Upper Arm Region Moderate depletion  Thoracic and Lumbar Region Mild depletion  Buccal Region Mild depletion  Temple Region Mild depletion  Clavicle Bone Region Moderate depletion  Clavicle and Acromion Bone Region Moderate depletion   Scapular Bone Region Unable to assess  Dorsal Hand Moderate depletion  Patellar Region Unable to assess  [edema in LE]  Anterior Thigh Region Unable to assess  Posterior Calf Region Unable to assess  Edema (RD Assessment) Mild  Hair Reviewed  Eyes Reviewed  Mouth Reviewed  Skin Reviewed  Nails Reviewed       Diet Order:   Diet Order             Diet clear liquid Room service appropriate? Yes; Fluid consistency: Thin  Diet effective now                   EDUCATION NEEDS:   Education needs have been addressed  Skin:  Skin Assessment: Reviewed RN Assessment  Last BM:  unknown, PTA  Height:   Ht Readings from Last 1 Encounters:  06/01/22 '5\' 6"'$  (1.676 m)    Weight:   Wt Readings from Last 1 Encounters:  06/01/22 73.9 kg    BMI:  Body mass index is 26.31 kg/m.  Estimated Nutritional Needs:   Kcal:  1850-2220kcal  Protein:  75-90g  Fluid:  1850-2214m   KCandise Bowens MS, RD, LDN, CNSC See AMiON for contact information

## 2022-06-01 NOTE — Consult Note (Signed)
Renal Service Consult Note [facility]  Tamara Friedman 06/01/2022 Sol Blazing, MD Requesting Physician: Dr. Lupita Leash  Reason for Consult: Renal failure HPI: The patient is a 71 y.o. year-old w/ PMH as below who presented 10/31 w/ emesis, abd pain. Also lower abd pain, hx of UTI's. In ED creat was 2.39 (baseline 1.5- 1.8). Pt admitted and started on IV abx, cx's sent. We are asked to see for renal failure.    Pt seen in room.  Still having some abd pains but N/V is better and she is taking clear liquids. No SOB or leg swelling. No voiding issues, no change in urine color.   ROS - denies CP, no joint pain, no HA, no blurry vision, no rash, no diarrhea, no difficulty voiding   Past Medical History  Past Medical History:  Diagnosis Date   High cholesterol    Hypertension    Past Surgical History  Past Surgical History:  Procedure Laterality Date   PARTIAL HYSTERECTOMY     Family History History reviewed. No pertinent family history. Social History  reports that she has never smoked. She has never used smokeless tobacco. She reports that she does not drink alcohol and does not use drugs. Allergies No Known Allergies Home medications Prior to Admission medications   Medication Sig Start Date End Date Taking? Authorizing Provider  albuterol (VENTOLIN HFA) 108 (90 Base) MCG/ACT inhaler Inhale 1-2 puffs into the lungs every 6 (six) hours as needed for wheezing or shortness of breath.   Yes [provider]  amLODipine (NORVASC) 10 MG tablet Take 1 tablet (10 mg total) by mouth daily. 12/30/21  Yes Eulogio Bear U, DO  aspirin 81 MG tablet Take 81 mg by mouth daily.    Yes [provider]  atorvastatin (LIPITOR) 10 MG tablet Take 10 mg by mouth daily.   Yes [provider]  carvedilol (COREG) 25 MG tablet Take 25 mg by mouth 2 (two) times daily with a meal.   Yes [provider]  magnesium hydroxide (MILK OF MAGNESIA) 400 MG/5ML suspension  Take 30 mLs by mouth daily as needed for mild constipation. 12/29/21  Yes Geradine Girt, DO  Melatonin 10 MG TABS Take 10 mg by mouth at bedtime.   Yes [provider]  Multiple Vitamins-Minerals (MULTIVITAMIN ADULTS) TABS Take 1 tablet by mouth daily after supper.   Yes [provider]  polyethylene glycol (MIRALAX / GLYCOLAX) 17 g packet Take 17 g by mouth daily. Patient taking differently: Take 17 g by mouth daily as needed for mild constipation. 03/16/22  Yes Bonnielee Haff, MD  tiZANidine (ZANAFLEX) 4 MG tablet Take 1 tablet (4 mg total) by mouth every 8 (eight) hours as needed for muscle spasms. 05/12/22  Yes Kathie Dike, MD  ondansetron (ZOFRAN-ODT) 8 MG disintegrating tablet Take 1 tablet (8 mg total) by mouth every 8 (eight) hours as needed for nausea or vomiting. Patient not taking: Reported on 05/31/2022 11/30/21   Kathie Dike, MD  oxyCODONE (OXY IR/ROXICODONE) 5 MG immediate release tablet Take 1 tablet (5 mg total) by mouth every 6 (six) hours as needed for severe pain. Patient not taking: Reported on 05/31/2022 05/12/22   Kathie Dike, MD     Vitals:   06/01/22 0156 06/01/22 0545 06/01/22 0950 06/01/22 1404  BP: (!) 140/75 136/75 (!) 155/83 127/72  Pulse: 60 75 63 70  Resp: _0 Temp: 98.4 F (36.9 C) 98.9 F (37.2 C) 98 F (  36.7 C) 98.5 F (36.9 C)  TempSrc: Oral Oral Oral Oral  SpO2: 100% 100% 100% 100%  Weight:      Height:       Exam Gen alert, no distress, pleasant No rash, cyanosis or gangrene Sclera anicteric, throat clear  No jvd or bruits Chest clear bilat to bases, no rales/ wheezing RRR no MRG Abd soft ntnd no mass or ascites +bs GU defer MS no joint effusions or deformity Ext trace RLE edema, no LLE edema Neuro is alert, Ox 3 , nf      Home meds include - norvasc, aspirin, lipitor, coreg 25 bid, oxycodone IR Prn, vits/ prns/ supp    UA 10/31 - cloudy, large LE, prot > 300 (was neg on 05/06/22)                       Few bact, 6-10 rbc, > 50 wbc   UNa 77, UCr 119     CT abd w/o contrast - Urinary Tract: No hydronephrosis or renal obstruction is noted. No renal or ureteral calculi are noted. Urinary bladder is unremarkable.     BP 130 - 170 / 70-90,  HR 70-80  RR 20  afeb    Na 142  K 3.9 CO2 19  BUN 42  CReat 2.86  alb 3.3       CPK 62  Hb 10  WBC 9K      B/l creat 1.5- 1.8 from early Oct 2023, eGFR 30- 36   Assessment/ Plan: AKI on CKD 3b - B/l creat 1.5- 1.8 from early Oct 2023, eGFR 30- 36. Creat here 2.39 on admission in setting of abd pain, N/V and possible UTI/ infection. No hypotension. UA c/w infection. CT abd w/o obstruction. Does not look real dehydrated. BP's normal to high, got 1L bolus in ED. Getting IVF"s at 100 cc/hr. Will give another 500 cc bolus and ^IVF to 125 cc/hr. F/u labs in am. AKI due to vol depletion vs ATN vs other. Will follow.  Abd pain/ N/V - suspected UTI, on Rocephin IV Chronic syst HF - not vol overload at this time Metabolic acidosis - not severe, can switch to bicarb tomorrow if not better.  Acute pancreatitis - w/ ^'d lipase    Rob Jahdai Padovano  MD 06/01/2022, 5:12 PM Recent Labs  Lab 05/31/22 1456 05/31/22 2238 06/01/22 0441  HGB 12.2 11.1* 10.0*  ALBUMIN 4.5 3.9 3.3*  CALCIUM 10.3  --  8.5*  PHOS  --  4.9* 5.3*  CREATININE 2.39*  --  2.86*  K 4.5  --  3.9   Inpatient medications:  aspirin  81 mg Oral Daily   atorvastatin  10 mg Oral Daily   carvedilol  25 mg Oral BID WC   feeding supplement  1 Container Oral BID BM   melatonin  10 mg Oral QHS   pneumococcal 20-valent conjugate vaccine  0.5 mL Intramuscular Tomorrow-1000    sodium chloride 100 mL/hr at 06/01/22 1143   cefTRIAXone (ROCEPHIN)  IV     acetaminophen **OR** acetaminophen, HYDROcodone-acetaminophen, tiZANidine

## 2022-06-01 NOTE — TOC Initial Note (Signed)
**Note De-Identified Tamara Obfuscation** Transition of Care [facility]) - Initial/Assessment Note    Patient Details  Name: Tamara Friedman MRN: [id number] Date of Birth: 1951-07-31  Transition of Care [facility]) CM/SW Contact:    Leeroy Cha, RN Phone Number: 06/01/2022, 9:40 AM  Clinical Narrative:                  Transition of Care [facility]) Screening Note   Patient Details  Name: Tamara Friedman Date of Birth: 1950/10/01   Transition of Care [facility]) CM/SW Contact:    Leeroy Cha, RN Phone Number: 06/01/2022, 9:40 AM    Transition of Care Department [facility]) has reviewed patient and no TOC needs have been identified at this time. We will continue to monitor patient advancement through interdisciplinary progression rounds. If new patient transition needs arise, please place a TOC consult.    Expected Discharge Plan: Home/Self Care Barriers to Discharge: Continued Medical Work up   Patient Goals and CMS Choice Patient states their goals for this hospitalization and ongoing recovery are:: to return to my home      Expected Discharge Plan and Services Expected Discharge Plan: Home/Self Care   Discharge Planning Services: CM Consult   Living arrangements for the past 2 months: Apartment                                      Prior Living Arrangements/Services Living arrangements for the past 2 months: Apartment Lives with:: Self Patient language and need for interpreter reviewed:: Yes Do you feel safe going back to the place where you live?: Yes            Criminal Activity/Legal Involvement Pertinent to Current Situation/Hospitalization: No - Comment as needed  Activities of Daily Living Home Assistive Devices/Equipment: Cane (specify quad or straight) ADL Screening (condition at time of admission) Patient's cognitive ability adequate to safely complete daily activities?: Yes Is the patient deaf or have difficulty hearing?: No Does the patient have difficulty seeing, even when wearing  glasses/contacts?: No Does the patient have difficulty concentrating, remembering, or making decisions?: No Patient able to express need for assistance with ADLs?: Yes Does the patient have difficulty dressing or bathing?: No Independently performs ADLs?: Yes (appropriate for developmental age) Does the patient have difficulty walking or climbing stairs?: No Weakness of Legs: Both Weakness of Arms/Hands: None  Permission Sought/Granted                  Emotional Assessment Appearance:: Appears stated age Attitude/Demeanor/Rapport: Engaged Affect (typically observed): Calm, Blunt Orientation: : Oriented to Self, Oriented to Place, Oriented to  Time, Oriented to Situation Alcohol / Substance Use: Not Applicable Psych Involvement: No (comment)  Admission diagnosis:  UTI (urinary tract infection) [N39.0] Pulmonary embolism (HCC) [I26.99] AKI (acute kidney injury) ([place]) [N17.9] Nausea and vomiting in adult [R11.2] Cystitis [N30.90] Patient Active Problem List   Diagnosis Date Noted   Pulmonary embolism ([place]) 05/31/2022   UTI (urinary tract infection) 05/31/2022   Hypokalemia 05/08/2022   Acute cystitis 05/07/2022   Family history of malignant neoplasm of digestive organs 53/66/4403   Metabolic acidosis    Abdominal pain 12/25/2021   Abnormal CT scan 12/25/2021   AKI (acute kidney injury) ([place]) 11/29/2021   Generalized weakness 11/29/2021   Acute pancreatitis 11/28/2021   Dehydration 11/28/2021   Dyslipidemia 11/28/2021   Hypertensive emergency 04/28/2021   Intractable vomiting with nausea 04/27/2021   Acute  renal failure superimposed on stage 4 chronic kidney disease ([place]) 04/27/2021   Elevated troponin 04/27/2021   HTN (hypertension) 04/27/2021   Chronic CHF ([place]) 04/27/2021   PCP:  Lucianne Lei, MD Pharmacy:   [facility] [address]), [place] [address] [employer] [address] Phone: [phone] Fax:  [phone]     Social Determinants of Health (SDOH) Interventions    Readmission Risk Interventions   Row Labels 05/11/2022    1:59 PM  Readmission Risk Prevention Plan   Section Header. No data exists in this row.   Transportation Screening   Complete  PCP or Specialist Appt within 3-5 Days   Complete  HRI or [place]   Complete  Social Work Consult for [place] Planning/Counseling   Complete  Palliative Care Screening   Not Applicable  Medication Review Press photographer)   Complete

## 2022-06-02 DIAGNOSIS — B9620 Unspecified Escherichia coli [E. coli] as the cause of diseases classified elsewhere: Secondary | ICD-10-CM

## 2022-06-02 DIAGNOSIS — N390 Urinary tract infection, site not specified: Secondary | ICD-10-CM | POA: Diagnosis not present

## 2022-06-02 LAB — URINE CULTURE: Culture: >=100000 — AB

## 2022-06-02 LAB — CBC
HCT: 31.1 % — ABNORMAL LOW (ref 36.0–46.0)
Hemoglobin: 9.2 g/dL — ABNORMAL LOW (ref 12.0–15.0)
MCH: 26.1 pg (ref 26.0–34.0)
MCHC: 29.6 g/dL — ABNORMAL LOW (ref 30.0–36.0)
MCV: 88.4 fL (ref 80.0–100.0)
Platelets: 281 10*3/uL (ref 150–400)
RBC: 3.52 MIL/uL — ABNORMAL LOW (ref 3.87–5.11)
RDW: 17.5 % — ABNORMAL HIGH (ref 11.5–15.5)
WBC: 8.1 10*3/uL (ref 4.0–10.5)
nRBC: 0 % (ref 0.0–0.2)

## 2022-06-02 LAB — BASIC METABOLIC PANEL
Anion gap: 8 (ref 5–15)
BUN: 32 mg/dL — ABNORMAL HIGH (ref 8–23)
CO2: 18 mmol/L — ABNORMAL LOW (ref 22–32)
Calcium: 8.3 mg/dL — ABNORMAL LOW (ref 8.9–10.3)
Chloride: 112 mmol/L — ABNORMAL HIGH (ref 98–111)
Creatinine, Ser: 2.12 mg/dL — ABNORMAL HIGH (ref 0.44–1.00)
GFR, Estimated: 24 mL/min — ABNORMAL LOW (ref >60)
Glucose, Bld: 85 mg/dL (ref 70–99)
Potassium: 4.2 mmol/L (ref 3.5–5.1)
Sodium: 138 mmol/L (ref 135–145)

## 2022-06-02 LAB — LIPASE, BLOOD: Lipase: 52 U/L — ABNORMAL HIGH (ref 11–51)

## 2022-06-02 LAB — BRAIN NATRIURETIC PEPTIDE: B Natriuretic Peptide: 86.5 pg/mL (ref 0.0–100.0)

## 2022-06-02 MED ORDER — CEPHALEXIN 500 MG PO CAPS: 500.0000 mg | ORAL_CAPSULE | Freq: Two times a day (BID) | ORAL | Status: DC

## 2022-06-02 MED ORDER — AMLODIPINE BESYLATE 10 MG PO TABS
10.0000 mg | ORAL_TABLET | Freq: Every day | ORAL | Status: DC
  Administered 2022-06-02 – 2022-06-04 (×3): 10 mg via ORAL
  Filled 2022-06-02 (×3): qty 1

## 2022-06-02 MED ORDER — INFLUENZA VAC A&B SA ADJ QUAD 0.5 ML IM PRSY
0.5000 mL | PREFILLED_SYRINGE | INTRAMUSCULAR | Status: AC
  Administered 2022-06-04: 0.5 mL via INTRAMUSCULAR
  Filled 2022-06-02: qty 0.5

## 2022-06-02 NOTE — Plan of Care (Signed)
  Problem: Education: Goal: Knowledge of General Education information will improve Description: Including pain rating scale, medication(s)/side effects and non-pharmacologic comfort measures Outcome: Progressing   Problem: Clinical Measurements: Goal: Ability to maintain clinical measurements within normal limits will improve Outcome: Progressing Goal: Will remain free from infection Outcome: Progressing Goal: Diagnostic test results will improve Outcome: Progressing Goal: Respiratory complications will improve Outcome: Progressing Goal: Cardiovascular complication will be avoided Outcome: Progressing   Problem: Activity: Goal: Risk for activity intolerance will decrease Outcome: Progressing   Problem: Nutrition: Goal: Adequate nutrition will be maintained Outcome: Progressing   Problem: Coping: Goal: Level of anxiety will decrease Outcome: Progressing   Problem: Elimination: Goal: Will not experience complications related to bowel motility Outcome: Progressing   Problem: Pain Managment: Goal: General experience of comfort will improve Outcome: Progressing   Problem: Safety: Goal: Ability to remain free from injury will improve Outcome: Progressing   Problem: Skin Integrity: Goal: Risk for impaired skin integrity will decrease Outcome: Progressing   Problem: Elimination: Goal: Will not experience complications related to urinary retention Outcome: Adequate for Discharge

## 2022-06-02 NOTE — Progress Notes (Signed)
[facility] Progress Note  Subjective: feeling better, asking for something solid to eat  Vitals:   06/01/22 1404 06/01/22 2032 06/02/22 0500 06/02/22 0604  BP: 127/72 (!) 102/57  (!) 162/91  Pulse: 70 68  72  Resp: _0 Temp: 98.5 F (36.9 C) 98.9 F (37.2 C)  98.8 F (37.1 C)  TempSrc: Oral Oral  Oral  SpO2: 100% 97%  100%  Weight:   75 kg   Height:        Exam: Gen alert, no distress, pleasant throat clear  No jvd or bruits Chest clear bilat to bases RRR no MRG Abd soft ntnd no mass or ascites +bs Ext no sig LE edema Neuro is alert, Ox 3 , nf      Home meds include - norvasc, aspirin, lipitor, coreg 25 bid, oxycodone IR Prn, vits/ prns/ supp      UA 10/31 - cloudy, large LE, prot > 300 (was neg on 05/06/22)                      Few bact, 6-10 rbc, > 50 wbc   UNa 77, UCr 119     CT abd w/o contrast - Urinary Tract: No hydronephrosis or renal obstruction is noted. No renal or ureteral calculi are noted. Urinary bladder is unremarkable.     BP 130 - 170 / 70-90,  HR 70-80  RR 20  afeb    Na 142  K 3.9 CO2 19  BUN 42  CReat 2.86  alb 3.3       CPK 62  Hb 10  WBC 9K      B/l creat 1.5- 1.8 from early Oct 2023, eGFR 30- 36     Assessment/ Plan: AKI on CKD 3b - B/l creat 1.5- 1.8 from early Oct 2023, eGFR 30- 36. Creat here 2.39 on admission in setting of abd pain, N/V and possible UTI/ infection. No hypotension. UA c/w infection, CT abd w/o obstruction. Suspect AKI due to vol depletion. With IVF"s creat peaked at 2.8 yest and is down to 2.1 today. Good UOP and abd pain / nausea appears to be resolving. No further suggestions, pt agreeable to f/u w/ [facility] in OP setting for AKI and CKD, will arrange a f/u appt. Will sign off.  Abd pain/ N/V - suspected UTI, on Rocephin IV Chronic syst HF - no vol excess HTN - getting coreg, home norvasc on hold appropriately Acute pancreatitis - w/ ^'d lipase       Tamara Friedman 06/02/2022, 10:38 AM   Recent Labs   Lab 05/31/22 2238 06/01/22 0441 06/02/22 0456  HGB 11.1* 10.0* 9.2*  ALBUMIN 3.9 3.3*  --   CALCIUM  --  8.5* 8.3*  PHOS 4.9* 5.3*  --   CREATININE  --  2.86* 2.12*  K  --  3.9 4.2   No results for input(s): "IRON", "TIBC", "FERRITIN" in the last 168 hours. Inpatient medications:  aspirin  81 mg Oral Daily   atorvastatin  10 mg Oral Daily   carvedilol  25 mg Oral BID WC   feeding supplement  1 Container Oral BID BM   [START ON 06/03/2022] influenza vaccine adjuvanted  0.5 mL Intramuscular Tomorrow-1000   melatonin  10 mg Oral QHS    sodium chloride 125 mL/hr at 06/02/22 1000   cefTRIAXone (ROCEPHIN)  IV Stopped (06/01/22 2216)   acetaminophen **OR** acetaminophen, HYDROcodone-acetaminophen, tiZANidine

## 2022-06-02 NOTE — Evaluation (Signed)
Occupational Therapy Evaluation Patient Details Name: Tamara Friedman MRN: [id number] DOB: [birth year] Today's Date: 06/02/2022   History of Present Illness 71 year old female with hypertension recurrent cystitis hypokalemia, CHF, hyperlipidemia stage IV CKD admitted 05/31/22 with working diagnosis of intractable nausea vomiting dehydration and AKI   Clinical Impression   Pt admitted with the above diagnosis and has the deficits listed below. Pt would benefit from cont OT to increase independence with basic adls from min guard level to S to mod I level. Pt is close to baseline and appears to have assistance as needed at home.  Will continue to see with focus on building endurance with adls and fall prevention.      Recommendations for follow up therapy are one component of a multi-disciplinary discharge planning process, led by the attending physician.  Recommendations may be updated based on patient status, additional functional criteria and insurance authorization.   Follow Up Recommendations  Home health OT    Assistance Recommended at Discharge Frequent or constant Supervision/Assistance  Patient can return home with the following A little help with walking and/or transfers;A little help with bathing/dressing/bathroom;Assistance with cooking/housework;Assist for transportation;Help with stairs or ramp for entrance    Functional Status Assessment  Patient has had a recent decline in their functional status and demonstrates the ability to make significant improvements in function in a reasonable and predictable amount of time.  Equipment Recommendations  None recommended by OT    Recommendations for Other Services       Precautions / Restrictions Precautions Precautions: Fall Precaution Comments: pain from recent fall, VRE/contact Restrictions Weight Bearing Restrictions: No      Mobility Bed Mobility Overal bed mobility: Needs Assistance Bed Mobility: Supine to Sit      Supine to sit: Supervision     General bed mobility comments: no assist. HOB raised    Transfers Overall transfer level: Needs assistance Equipment used: Rolling walker (2 wheels) Transfers: Sit to/from Stand Sit to Stand: Min guard           General transfer comment: extra time  to rise, wide base      Balance Overall balance assessment: Needs assistance Sitting-balance support: No upper extremity supported, Feet supported Sitting balance-Leahy Scale: Good Sitting balance - Comments: pt donned socks without assist   Standing balance support: During functional activity, Reliant on assistive device for balance, Bilateral upper extremity supported Standing balance-Leahy Scale: Fair Standing balance comment: Pt let go of walker for brief moments to manage clothing with no LOB                           ADL either performed or assessed with clinical judgement   ADL Overall ADL's : Needs assistance/impaired Eating/Feeding: Independent   Grooming: Wash/dry hands;Wash/dry face;Oral care;Supervision/safety;Standing Grooming Details (indicate cue type and reason): stood for 3 minutes at sink Upper Body Bathing: Sitting;Set up   Lower Body Bathing: Min guard;Sit to/from stand;Cueing for compensatory techniques   Upper Body Dressing : Set up;Sitting   Lower Body Dressing: Min guard;Sit to/from stand;Cueing for compensatory techniques   Toilet Transfer: Min guard;Rolling walker (2 wheels);Comfort height toilet;Grab bars Toilet Transfer Details (indicate cue type and reason): ambulating to bathroom Toileting- Clothing Manipulation and Hygiene: Supervision/safety;Sit to/from stand;Cueing for compensatory techniques       Functional mobility during ADLs: Min guard;Rolling walker (2 wheels) General ADL Comments: Pt feel she is very close to baseline with adls; just feels mildly weak  Vision Baseline Vision/History: 0 No visual deficits Ability to See in  Adequate Light: 0 Adequate Patient Visual Report: No change from baseline Vision Assessment?: No apparent visual deficits     Perception     Praxis      Pertinent Vitals/Pain Pain Assessment Pain Assessment: Faces Faces Pain Scale: Hurts a little bit Pain Location: abdomen Pain Descriptors / Indicators: Discomfort Pain Intervention(s): Premedicated before session, Repositioned, Monitored during session     Hand Dominance Right   Extremity/Trunk Assessment Upper Extremity Assessment Upper Extremity Assessment: Overall WFL for tasks assessed   Lower Extremity Assessment Lower Extremity Assessment: Defer to PT evaluation   Cervical / Trunk Assessment Cervical / Trunk Assessment: Normal   Communication Communication Communication: No difficulties   Cognition Arousal/Alertness: Awake/alert Behavior During Therapy: WFL for tasks assessed/performed, Flat affect Overall Cognitive Status: Within Functional Limits for tasks assessed                                 General Comments: States family is avaiable as much or as little as needed but she would like to be well enough to go home that she does not have to come back.     General Comments  Pt close to baseline    Exercises     Shoulder Instructions      Home Living Family/patient expects to be discharged to:: Private residence Living Arrangements: Alone Available Help at Discharge: Family;Available 24 hours/day Type of Home: Apartment Home Access: Stairs to enter Entrance Stairs-Number of Steps: 1 Entrance Stairs-Rails: None Home Layout: One level     Bathroom Shower/Tub: Teacher, early years/pre: Standard Bathroom Accessibility: Yes   Home Equipment: Conservation officer, nature (2 wheels);Cane - single point          Prior Functioning/Environment Prior Level of Function : Independent/Modified Independent             Mobility Comments: does not drive, grandson assists with groceries and  transportation, reports using RW ADLs Comments: independent with ADLs, sponge bathes        OT Problem List: Decreased knowledge of use of DME or AE;Decreased knowledge of precautions;Pain;Impaired balance (sitting and/or standing)      OT Treatment/Interventions: Self-care/ADL training;Therapeutic activities;DME and/or AE instruction;Balance training    OT Goals(Current goals can be found in the care plan section) Acute Rehab OT Goals Patient Stated Goal: to go home soon OT Goal Formulation: With patient Time For Goal Achievement: 06/16/22 Potential to Achieve Goals: Good ADL Goals Pt Will Perform Grooming: with modified independence;standing Additional ADL Goal #1: Pt will gather all clothing with walker and dress self with mod I Additional ADL Goal #2: Pt will walk to bathroom with walker and complete all toileting with mod I  OT Frequency: Min 2X/week    Co-evaluation              AM-PAC OT "6 Clicks" Daily Activity     Outcome Measure Help from another person eating meals?: None Help from another person taking care of personal grooming?: None Help from another person toileting, which includes using toliet, bedpan, or urinal?: A Little Help from another person bathing (including washing, rinsing, drying)?: A Little Help from another person to put on and taking off regular upper body clothing?: None Help from another person to put on and taking off regular lower body clothing?: A Little 6 Click Score: 21   End of Session Equipment  Utilized During Treatment: Rolling walker (2 wheels) Nurse Communication: Mobility status  Activity Tolerance: Patient tolerated treatment well Patient left: in chair;with call bell/phone within reach;with chair alarm set  OT Visit Diagnosis: Unsteadiness on feet (R26.81)                Time: 0761-5183 OT Time Calculation (min): 28 min Charges:  OT General Charges $OT Visit: 1 Visit OT Evaluation $OT Eval Moderate Complexity: 1 Mod OT  Treatments $Self Care/Home Management : 8-22 mins  Glenford Peers 06/02/2022, 9:26 AM

## 2022-06-02 NOTE — Progress Notes (Signed)
Mobility Specialist - Progress Note   06/02/22 1149  Mobility  Activity Ambulated with assistance in hallway  Level of Assistance Modified independent, requires aide device or extra time  Assistive Device Front wheel walker  Distance Ambulated (ft) 1000 ft  Range of Motion/Exercises Active  Activity Response Tolerated well  Mobility Referral Yes  $Mobility charge 1 Mobility   Pt was found in bed and agreeable to ambulate. Had no complaints during ambulation and at EOS returned to bed with all necessities in reach.  Ferd Hibbs Mobility Specialist

## 2022-06-02 NOTE — Progress Notes (Signed)
Mobility Specialist - Progress Note   06/02/22 1527  Mobility  Activity Ambulated with assistance in hallway  Level of Assistance Modified independent, requires aide device or extra time  Assistive Device Front wheel walker  Distance Ambulated (ft) 700 ft  Range of Motion/Exercises Active  Activity Response Tolerated well  Mobility Referral Yes  $Mobility charge 1 Mobility   Pt was found in bed and agreeable to ambulate. Had no complaints and at EOS returned to bed with all necessities in reach.  Ferd Hibbs Mobility Specialist

## 2022-06-02 NOTE — Progress Notes (Signed)
PROGRESS NOTE Tamara Friedman  JOA:[id number] DOB: 1950-08-28 DOA: 05/31/2022 PCP: Lucianne Lei, MD   Brief Narrative/Hospital Course: 71 year old female with hypertension recurrent cystitis hypokalemia, CHF, hyperlipidemia stage IV CKDIIIb b/l creat~1.5 05/11/22 admitted with working diagnosis of intractable nausea vomiting dehydration and AKI  She presented to ED 10/31 with nausea and vomiting x1 day without diarrhea, pain similar complaints in the setting of UTI, with occasional hematuria.  In the ED blood pressure 172-180s heart rate 105 Labs bicarb 20 creatinine 2.3, lipase 67 stable CBC UA WBC more than 50 LE large nitrate negative, CT abdomen pelvis without contrast large sliding-type hiatal hernia.   Subjective: Seen thsi am On bedside chair No nausea and vomiting Tolerating diet Mild abd discomfort, last BM when Overnight patient has been afebrile BP 160 this morning Creatinine nicely downtrending 2.1  Assessment and Plan: Principal Problem:   UTI (urinary tract infection) Active Problems:   Intractable vomiting with nausea   Acute renal failure superimposed on stage 4 chronic kidney disease (HCC)   HTN (hypertension)   Chronic CHF (HCC)   Acute pancreatitis   Dehydration   Dyslipidemia   AKI (acute kidney injury) ([place])   Malnutrition of moderate degree   Acute renal failure (ARF) (HCC)   AKI on SWF0X Metabolic acidosis Dehydration: B/l bun/creat as below ~1.5> recent AKI up to 2.5 on 10/6. Aaki 2/2 prerenal due to intractable nausea vomiting poor intake.Improving appreciate nephrology>signing off.  Continue ivf,trend labs as below, augment diet, monitor output, avoid diuretics/hypotension nephrotoxic medication.Renally dose all meds-consulted pharmacy. Recent Labs    03/16/22 0441 05/02/22 1138 05/06/22 1827 05/07/22 0707 05/07/22 1222 05/09/22 0335 05/10/22 0337 05/11/22 0328 05/31/22 1456 06/01/22 0441 06/02/22 0456  BUN 26* 34* 50*  --  43* '23 17 22  '$ 35* 42* 32*  CREATININE 1.62* 1.58* 2.50* 2.40* 2.03* 1.78* 1.53* 1.52* 2.39* 2.86* 2.12*    Hyperphosphatemia on CKD.Monitor Intractable nausea vomiting:CT showing large sliding type hiatal hernia.  Placed on clear liquid diet> and being advanced as tolerated, continue antiemetics as needed Elevated lipase, appears chronically elevated? Less likely acute pancreatitis- Ct abdomen with  no acute finding-could be from dehydration.Repeat lipsae in 100- previosuly it has been in 60-100s.  Downtrending  Hypertension:BP is controlled/uptrending.continue to hold amlodipine_resume if blood pressure remains poorly controlled, continue coreg  E. coli UTI: Cont ceftriaxone.  HLD on statin Chronic systolic CHF hypovolemic currently, no signs of fluid overload monitor BnP, daily weight net balance Net IO Since Admission: 2,979.74 mL [06/02/22 0934]  No results for input(s): "PROBNP" in the last 168 hours.   Moderate malnutrition augment diet as below Nutrition Problem: Moderate Malnutrition Etiology: acute illness Signs/Symptoms: mild fat depletion, mild muscle depletion, edema Interventions: Boost Breeze    DVT prophylaxis: SCDs Start: 05/31/22 2142 Code Status:   Code Status: DNR Family Communication: plan of care discussed with patient at bedside. Patient status is: Admitted under observation but will need inpatient because of acute renal failure Level of care: Telemetry  Dispo: The patient is from: lives with herself, grandson checks on her- Juan-0 called and udpated him            Anticipated disposition: HHPT tomorrow if renal function improves   Objective: Vitals last 24 hrs: Vitals:   06/01/22 1404 06/01/22 2032 06/02/22 0500 06/02/22 0604  BP: 127/72 (!) 102/57  (!) 162/91  Pulse: 70 68  72  Resp: '18 20  20  '$ Temp: 98.5 F (36.9 C) 98.9 F (37.2 C)  98.8 F (  37.1 C)  TempSrc: Oral Oral  Oral  SpO2: 100% 97%  100%  Weight:   75 kg   Height:       Weight change: 1.064  kg  Physical Examination: General exam:Alert awake,older than stated age HEENT:Oral mucosa moist, Ear/Nose WNL grossly Respiratory system:Bilaterally clear BS,no use of accessory muscle Cardiovascular system:S1 & S2 +,No JVD. Gastrointestinal system:Abdomen soft, mildly tender-non specific,ND, BS+ Nervous System: Alert, awake, moving extremities, she follows commands. Extremities: LE edema,distal peripheral pulses palpable.  Skin: No rashes,no icterus. MSK: Normal muscle bulk,tone,power   Medications reviewed:  Scheduled Meds:  aspirin  81 mg Oral Daily   atorvastatin  10 mg Oral Daily   carvedilol  25 mg Oral BID WC   feeding supplement  1 Container Oral BID BM   melatonin  10 mg Oral QHS   Continuous Infusions:  sodium chloride 125 mL/hr at 06/02/22 0517   cefTRIAXone (ROCEPHIN)  IV Stopped (06/01/22 2216)      Diet Order             Diet full liquid Room service appropriate? Yes; Fluid consistency: Thin  Diet effective now                   Intake/Output Summary (Last 24 hours) at 06/02/2022 0934 Last data filed at 06/02/2022 0641 Gross per 24 hour  Intake 2383.74 ml  Output 2 ml  Net 2381.74 ml   Net IO Since Admission: 2,979.74 mL [06/02/22 0934]  Wt Readings from Last 3 Encounters:  06/02/22 75 kg  05/07/22 72.6 kg  03/12/22 72.6 kg    Unresulted Labs (From admission, onward)     Start     Ordered   06/02/22 9767  Basic metabolic panel  Daily at 5am,   R     Question:  Specimen collection method  Answer:  Lab=Lab collect   06/01/22 0856   06/02/22 0500  CBC  Daily at 5am,   R     Question:  Specimen collection method  Answer:  Lab=Lab collect   06/01/22 0856   06/02/22 0500  Brain natriuretic peptide  Daily at 5am,   R     Question:  Specimen collection method  Answer:  Lab=Lab collect   06/01/22 1411          Data Reviewed: I have personally reviewed following labs and imaging studies CBC: Recent Labs  Lab 05/31/22 1456 05/31/22 2238  06/01/22 0441 06/02/22 0456  WBC 7.9 8.8 9.5 8.1  NEUTROABS  --  6.5  --   --   HGB 12.2 11.1* 10.0* 9.2*  HCT 39.7 37.3 33.5* 31.1*  MCV 83.8 85.9 86.3 88.4  PLT 480* 339 322 341   Basic Metabolic Panel: Recent Labs  Lab 05/31/22 1456 05/31/22 2238 06/01/22 0441 06/02/22 0456  NA 141  --  142 138  K 4.5  --  3.9 4.2  CL 109  --  113* 112*  CO2 19*  --  19* 18*  GLUCOSE 133*  --  95 85  BUN 35*  --  42* 32*  CREATININE 2.39*  --  2.86* 2.12*  CALCIUM 10.3  --  8.5* 8.3*  MG  --  2.4 2.2  --   PHOS  --  4.9* 5.3*  --    GFR: Estimated Creatinine Clearance: 24.5 mL/min (A) (by C-G formula based on SCr of 2.12 mg/dL (H)). Liver Function Tests: Recent Labs  Lab 05/31/22 1456 05/31/22 2238 06/01/22 0441  AST  17 17 14*  ALT '17 14 12  '$ ALKPHOS 105 87 71  BILITOT 0.8 0.7 1.0  PROT 8.8* 7.5 6.3*  ALBUMIN 4.5 3.9 3.3*   Recent Labs  Lab 05/31/22 1456 06/01/22 0441 06/02/22 0456  LIPASE 67* 106* 52*   Recent Labs  Lab 05/31/22 2019 05/31/22 2238  LATICACIDVEN 1.7 1.6   Recent Results (from the past 240 hour(s))  Urine Culture     Status: Abnormal   Collection Time: 05/31/22 10:53 AM   Specimen: Urine, Clean Catch  Result Value Ref Range Status   Specimen Description   Final    URINE, CLEAN CATCH Performed at [facility], [place] [address], [place], [place] [address]    Special Requests   Final    NONE Performed at [facility], [place] [address] [place],  [address]    Culture >=100,000 COLONIES/mL ESCHERICHIA COLI (A)  Final   Report Status 06/02/2022 FINAL  Final   Organism ID, Bacteria ESCHERICHIA COLI (A)  Final      Susceptibility   Escherichia coli - MIC*    AMPICILLIN <=2 SENSITIVE Sensitive     CEFAZOLIN <=4 SENSITIVE Sensitive     CEFEPIME <=0.12 SENSITIVE Sensitive     CEFTRIAXONE <=0.25 SENSITIVE Sensitive     CIPROFLOXACIN <=0.25 SENSITIVE Sensitive     GENTAMICIN <=1 SENSITIVE Sensitive      IMIPENEM <=0.25 SENSITIVE Sensitive     NITROFURANTOIN <=16 SENSITIVE Sensitive     TRIMETH/SULFA <=20 SENSITIVE Sensitive     AMPICILLIN/SULBACTAM <=2 SENSITIVE Sensitive     PIP/TAZO <=4 SENSITIVE Sensitive     * >=100,000 COLONIES/mL ESCHERICHIA COLI    Antimicrobials: Anti-infectives (From admission, onward)    Start     Dose/Rate Route Frequency Ordered Stop   06/01/22 1800  cefTRIAXone (ROCEPHIN) 1 g in sodium chloride 0.9 % 100 mL IVPB        1 g 200 mL/hr over 30 Minutes Intravenous Every 24 hours 05/31/22 2142     05/31/22 1845  cefTRIAXone (ROCEPHIN) 1 g in sodium chloride 0.9 % 100 mL IVPB        1 g 200 mL/hr over 30 Minutes Intravenous  Once 05/31/22 1832 06/01/22 0820      Culture/Microbiology  Radiology Studies: US RENAL  Result Date: 06/01/2022 CLINICAL DATA:  Acute kidney injury. EXAM: RENAL / URINARY TRACT ULTRASOUND COMPLETE COMPARISON:  July 24, 2013. FINDINGS: Right Kidney: Renal measurements: 8.0 x 4.8 x 3.8 cm = volume: 78 mL. Increased echogenicity of renal parenchyma is noted suggesting medical renal disease. No mass or hydronephrosis visualized. Left Kidney: Renal measurements: 8.4 x 4.3 x 4.1 cm = volume: 77 mL. Increased echogenicity of renal parenchyma is noted suggesting medical renal disease. 2.1 cm simple cyst is noted. No mass or hydronephrosis visualized. Bladder: Decompressed as patient has recently voided. Other: None. IMPRESSION: Increased echogenicity of renal parenchyma is noted bilaterally consistent with medical renal disease. No hydronephrosis or renal obstruction is noted. Mild bilateral renal atrophy is noted. Electronically Signed   By: Marijo Conception M.D.   On: 06/01/2022 17:47   CT ABDOMEN PELVIS WO CONTRAST  Result Date: 05/31/2022 CLINICAL DATA:  Acute right-sided abdominal pain. EXAM: CT ABDOMEN AND PELVIS WITHOUT CONTRAST TECHNIQUE: Multidetector CT imaging of the abdomen and pelvis was performed following the standard protocol  without IV contrast. RADIATION DOSE REDUCTION: This exam was performed according to the departmental dose-optimization program which includes automated exposure control, adjustment of the mA  and/or kV according to patient size and/or use of iterative reconstruction technique. COMPARISON:  May 07, 2022. FINDINGS: Lower chest: Visualized lung bases are unremarkable. Large sliding-type hiatal hernia is noted. Hepatobiliary: No focal liver abnormality is seen. No gallstones, gallbladder wall thickening, or biliary dilatation. Pancreas: Unremarkable. No pancreatic ductal dilatation or surrounding inflammatory changes. Spleen: Normal in size without focal abnormality. Adrenals/Urinary Tract: Adrenal glands are unremarkable. Stable left renal cysts are noted for which no further follow-up is required. No hydronephrosis or renal obstruction is noted. No renal or ureteral calculi are noted. Urinary bladder is unremarkable. Stomach/Bowel: Stomach is within normal limits. Appendix appears normal. No evidence of bowel wall thickening, distention, or inflammatory changes. Vascular/Lymphatic: Aortic atherosclerosis. No enlarged abdominal or pelvic lymph nodes. Reproductive: Status post hysterectomy. No adnexal masses. Other: No abdominal wall hernia or abnormality. No abdominopelvic ascites. Musculoskeletal: No acute or significant osseous findings. IMPRESSION: Large sliding-type hiatal hernia. No acute abnormality seen in the abdomen or pelvis. Aortic Atherosclerosis (ICD10-I70.0). Electronically Signed   By: Marijo Conception M.D.   On: 05/31/2022 17:01     LOS: 1 day   Antonieta Pert, MD [facility]  06/02/2022, 9:34 AM

## 2022-06-03 DIAGNOSIS — N179 Acute kidney failure, unspecified: Secondary | ICD-10-CM | POA: Diagnosis not present

## 2022-06-03 DIAGNOSIS — N184 Chronic kidney disease, stage 4 (severe): Secondary | ICD-10-CM | POA: Diagnosis not present

## 2022-06-03 LAB — CBC
HCT: 27.5 % — ABNORMAL LOW (ref 36.0–46.0)
Hemoglobin: 8.1 g/dL — ABNORMAL LOW (ref 12.0–15.0)
MCH: 26.1 pg (ref 26.0–34.0)
MCHC: 29.5 g/dL — ABNORMAL LOW (ref 30.0–36.0)
MCV: 88.7 fL (ref 80.0–100.0)
Platelets: 271 10*3/uL (ref 150–400)
RBC: 3.1 MIL/uL — ABNORMAL LOW (ref 3.87–5.11)
RDW: 17.2 % — ABNORMAL HIGH (ref 11.5–15.5)
WBC: 8.1 10*3/uL (ref 4.0–10.5)
nRBC: 0 % (ref 0.0–0.2)

## 2022-06-03 LAB — BASIC METABOLIC PANEL
Anion gap: 6 (ref 5–15)
BUN: 23 mg/dL (ref 8–23)
CO2: 19 mmol/L — ABNORMAL LOW (ref 22–32)
Calcium: 8.1 mg/dL — ABNORMAL LOW (ref 8.9–10.3)
Chloride: 112 mmol/L — ABNORMAL HIGH (ref 98–111)
Creatinine, Ser: 1.48 mg/dL — ABNORMAL HIGH (ref 0.44–1.00)
GFR, Estimated: 37 mL/min — ABNORMAL LOW (ref >60)
Glucose, Bld: 107 mg/dL — ABNORMAL HIGH (ref 70–99)
Potassium: 4.2 mmol/L (ref 3.5–5.1)
Sodium: 137 mmol/L (ref 135–145)

## 2022-06-03 LAB — HEPATIC FUNCTION PANEL
ALT: 10 U/L (ref 0–44)
AST: 14 U/L — ABNORMAL LOW (ref 15–41)
Albumin: 2.5 g/dL — ABNORMAL LOW (ref 3.5–5.0)
Alkaline Phosphatase: 57 U/L (ref 38–126)
Bilirubin, Direct: 0.1 mg/dL (ref 0.0–0.2)
Indirect Bilirubin: 0.2 mg/dL — ABNORMAL LOW (ref 0.3–0.9)
Total Bilirubin: 0.3 mg/dL (ref 0.3–1.2)
Total Protein: 5.2 g/dL — ABNORMAL LOW (ref 6.5–8.1)

## 2022-06-03 LAB — BRAIN NATRIURETIC PEPTIDE: B Natriuretic Peptide: 199.4 pg/mL — ABNORMAL HIGH (ref 0.0–100.0)

## 2022-06-03 MED ORDER — BISACODYL 10 MG RE SUPP
10.0000 mg | Freq: Once | RECTAL | Status: AC
  Administered 2022-06-03: 10 mg via RECTAL
  Filled 2022-06-03: qty 1

## 2022-06-03 MED ORDER — ONDANSETRON HCL 4 MG/2ML IJ SOLN
4.0000 mg | Freq: Four times a day (QID) | INTRAMUSCULAR | Status: DC | PRN
  Administered 2022-06-03: 4 mg via INTRAVENOUS

## 2022-06-03 MED ORDER — SODIUM CHLORIDE 0.9 % IV SOLN
1.0000 g | INTRAVENOUS | Status: DC
  Administered 2022-06-03: 1 g via INTRAVENOUS
  Filled 2022-06-03: qty 10

## 2022-06-03 MED ORDER — SENNOSIDES-DOCUSATE SODIUM 8.6-50 MG PO TABS
1.0000 | ORAL_TABLET | Freq: Two times a day (BID) | ORAL | Status: DC
  Administered 2022-06-03 – 2022-06-04 (×2): 1 via ORAL
  Filled 2022-06-03 (×3): qty 1

## 2022-06-03 NOTE — Discharge Summary (Signed)
Physician Discharge Summary  Tamara Friedman WNI:[id number] DOB: Dec 29, 1950 DOA: 05/31/2022  PCP: Lucianne Lei, MD  Admit date: 05/31/2022 Discharge date: 06/04/2022 Recommendations for Outpatient Follow-up:  Follow up with PCP in 1 weeks-call for appointment Please obtain BMP/CBC in one week  Discharge Dispo: Home Health Discharge Condition: Stable Code Status:   Code Status: DNR Diet recommendation:  Diet Order             DIET SOFT Room service appropriate? Yes; Fluid consistency: Thin  Diet effective now                  Brief/Interim Summary: 71 year old female with hypertension recurrent cystitis hypokalemia, CHF, hyperlipidemia stage IV CKDIIIb b/l creat~1.5 05/11/22 admitted with working diagnosis of intractable nausea vomiting dehydration and AKI  She presented to ED 10/31 with nausea and vomiting x1 day without diarrhea, pain similar complaints in the setting of UTI, with occasional hematuria.  In the ED blood pressure 172-180s heart rate 105 Labs bicarb 20 creatinine 2.3, lipase 67 stable CBC UA WBC more than 50 LE large nitrate negative, CT abdomen pelvis without contrast large sliding-type hiatal hernia. Diet was advanced seen by nephrology managed with IV fluids, creatinine improved to baseline,   She is clinically improved urine culture with E. coli will discharge on oral antibiotic.  #9 pain discomfort she will continue PPI she has been referred to Dr. Benson Norway in the past number provided again to follow-up with him regarding her sliding hernia  Discharge Diagnoses:  Principal Problem:   Acute renal failure superimposed on stage 4 chronic kidney disease ([place]) Active Problems:   Intractable vomiting with nausea   HTN (hypertension)   Chronic CHF ([place])   Dehydration   Dyslipidemia   AKI (acute kidney injury) ([place])   UTI (urinary tract infection)   Malnutrition of moderate degree   E. coli UTI (urinary tract infection)  AKI on FGH8E Metabolic  acidosis Dehydration: B/l bun/creat as below ~1.5> recent AKI up to 2.5 on 10/6. [place] 2/2 prerenal due to intractable nausea vomiting poor intake.  Improved to baseline tolerating diet Recent Labs    05/06/22 1827 05/07/22 0707 05/07/22 1222 05/09/22 0335 05/10/22 0337 05/11/22 0328 05/31/22 1456 06/01/22 0441 06/02/22 0456 06/03/22 0458 06/04/22 0610  BUN 50*  --  43* '23 17 22 '$ 35* 42* 32* 23 22  CREATININE 2.50* 2.40* 2.03* 1.78* 1.53* 1.52* 2.39* 2.86* 2.12* 1.48* 1.58*    Hyperphosphatemia on CKD.Monitor Intractable nausea vomiting:CT showing large sliding type hiatal hernia.  Placed on clear liquid diet> advanced and tolerating diet continue Zofran and PPI referral provided to Dr. Benson Norway.   Elevated lipase, appears chronically elevated? Less likely acute pancreatitis- Ct abdomen with  no acute finding-could be from dehydration.Repeat lipsae in 100- previosuly it has been in 60-100s.  Downtrending  Hypertension : Stable continue home meds  E. coli UTI : P.o. antibiotics prescribed for discharge. HLD on statin Chronic systolic CHF hypovolemic currently, no signs of fluid overload monitor BnP, daily weight net balanceNet IO Since Admission: 6,654.67 mL [06/04/22 1342]  Moderate malnutrition augment diet as below Nutrition Problem: Moderate Malnutrition Etiology: acute illness Signs/Symptoms: mild fat depletion, mild muscle depletion, edema Interventions: Boost Breeze  Consults: Nephrology Subjective: Alert awake oriented resting comfortably no complaints Feels ready for discharge home today  Discharge Exam: Vitals:   06/03/22 2054 06/04/22 0631  BP: (!) 145/66 (!) 149/87  Pulse: 64   Resp: 19 15  Temp: 98.4 F (36.9 C) 98.8 F (37.1 C)  SpO2: 100% 100%   General: Pt is alert, awake, not in acute distress Cardiovascular: RRR, S1/S2 +, no rubs, no gallops Respiratory: CTA bilaterally, no wheezing, no rhonchi Abdominal: Soft, NT, ND, bowel sounds + Extremities: no  edema, no cyanosis  Discharge Instructions  Discharge Instructions     AMB Referral to Community Care Coordinaton   Complete by: As directed    Primary Care Provider:  Lucianne Lei, MD of [place]: [facility]  *Patient is less than 30 days readmission, lives alone 4 admission in 6 months  Please assign to [facility] for post hospital care coordination for readmission prevention follow up calls and assess for further needs.  Questions please call:   Natividad Brood, RN BSN [place]  [phone] business mobile phone Toll free office [phone]  *[place]  [phone] Fax number: [phone] [email or web address]   Reason for Referral: Care Coordination (ACO patients)   Disease managment services needed: Nurse Case Manager   Diagnoses of:  Kidney Failure Heart Failure Hypertension     Expected date of contact: Emergent - 3 Days   Discharge instructions   Complete by: As directed    Cbc bmp in 1 wk. Follow up with Dr Benson Norway from GI regarding your sliding hiatal hernia.  Please call call MD or return to ER for similar or worsening recurring problem that brought you to hospital or if any fever,nausea/vomiting,abdominal pain, uncontrolled pain, chest pain,  shortness of breath or any other alarming symptoms.  Please follow-up your doctor as instructed in a week time and call the office for appointment.  Please avoid alcohol, smoking, or any other illicit substance and maintain healthy habits including taking your regular medications as prescribed.  You were cared for by a hospitalist during your hospital stay. If you have any questions about your discharge medications or the care you received while you were in the hospital after you are discharged, you can call the unit and ask to speak with the hospitalist on call if the  hospitalist that took care of you is not available.  Once you are discharged, your primary care physician will handle any further medical issues. Please note that NO REFILLS for any discharge medications will be authorized once you are discharged, as it is imperative that you return to your primary care physician (or establish a relationship with a primary care physician if you do not have one) for your aftercare needs so that they can reassess your need for medications and monitor your lab values   Increase activity slowly   Complete by: As directed       Allergies as of 06/04/2022   No Known Allergies      Medication List     STOP taking these medications    oxyCODONE 5 MG immediate release tablet Commonly known as: Oxy IR/ROXICODONE       TAKE these medications    albuterol 108 (90 Base) MCG/ACT inhaler Commonly known as: VENTOLIN HFA Inhale 1-2 puffs into the lungs every 6 (six) hours as needed for wheezing or shortness of breath.   amLODipine 10 MG tablet Commonly known as: NORVASC Take 1 tablet (10 mg total) by mouth daily.   aspirin 81 MG tablet Take 81 mg by mouth daily.   atorvastatin 10 MG tablet Commonly known as: LIPITOR Take 10 mg by mouth daily.   carvedilol 25 MG tablet Commonly known as: COREG Take 25 mg by  mouth 2 (two) times daily with a meal.   cefadroxil 500 MG capsule Commonly known as: DURICEF Take 1 capsule (500 mg total) by mouth 2 (two) times daily for 2 days.   magnesium hydroxide 400 MG/5ML suspension Commonly known as: MILK OF MAGNESIA Take 30 mLs by mouth daily as needed for mild constipation.   Melatonin 10 MG Tabs Take 10 mg by mouth at bedtime.   Multivitamin Adults Tabs Take 1 tablet by mouth daily after supper.   ondansetron 8 MG disintegrating tablet Commonly known as: ZOFRAN-ODT Take 1 tablet (8 mg total) by mouth every 8 (eight) hours as needed for up to 30 doses for nausea or vomiting.   pantoprazole 40 MG  tablet Commonly known as: Protonix Take 1 tablet (40 mg total) by mouth daily.   polyethylene glycol 17 g packet Commonly known as: MIRALAX / GLYCOLAX Take 17 g by mouth daily. What changed:  when to take this reasons to take this   senna-docusate 8.6-50 MG tablet Commonly known as: Senokot-S Take 1 tablet by mouth 2 (two) times daily for 7 days.   tiZANidine 4 MG tablet Commonly known as: ZANAFLEX Take 1 tablet (4 mg total) by mouth every 8 (eight) hours as needed for muscle spasms.        Follow-up Information     Lucianne Lei, MD Follow up in 1 week(s).   Specialty: Family Medicine Why: for cbc bmp check in 1 wk Contact information: [address] [place] [address] [phone]         Carol Ada, MD Follow up in 1 week(s).   Specialty: Gastroenterology Why: follow up reg sliding hernia Contact information: [place], [place] [address] [phone]                No Known Allergies  The results of significant diagnostics from this hospitalization (including imaging, microbiology, ancillary and laboratory) are listed below for reference.    Microbiology: Recent Results (from the past 240 hour(s))  Urine Culture     Status: Abnormal   Collection Time: 05/31/22 10:53 AM   Specimen: Urine, Clean Catch  Result Value Ref Range Status   Specimen Description   Final    URINE, CLEAN CATCH Performed at [facility], [place] [address], [place], [place] [address]    Special Requests   Final    NONE Performed at [facility], [place] [address] [place], [place] [address]    Culture >=100,000 COLONIES/mL ESCHERICHIA COLI (A)  Final   Report Status 06/02/2022 FINAL  Final   Organism ID, Bacteria ESCHERICHIA COLI (A)  Final      Susceptibility   Escherichia coli - MIC*    AMPICILLIN <=2 SENSITIVE Sensitive     CEFAZOLIN <=4 SENSITIVE Sensitive     CEFEPIME <=0.12 SENSITIVE Sensitive      CEFTRIAXONE <=0.25 SENSITIVE Sensitive     CIPROFLOXACIN <=0.25 SENSITIVE Sensitive     GENTAMICIN <=1 SENSITIVE Sensitive     IMIPENEM <=0.25 SENSITIVE Sensitive     NITROFURANTOIN <=16 SENSITIVE Sensitive     TRIMETH/SULFA <=20 SENSITIVE Sensitive     AMPICILLIN/SULBACTAM <=2 SENSITIVE Sensitive     PIP/TAZO <=4 SENSITIVE Sensitive     * >=100,000 COLONIES/mL ESCHERICHIA COLI    Procedures/Studies: US RENAL  Result Date: 06/01/2022 CLINICAL DATA:  Acute kidney injury. EXAM: RENAL / URINARY TRACT ULTRASOUND COMPLETE COMPARISON:  July 24, 2013. FINDINGS: Right Kidney: Renal measurements: 8.0 x 4.8  x 3.8 cm = volume: 78 mL. Increased echogenicity of renal parenchyma is noted suggesting medical renal disease. No mass or hydronephrosis visualized. Left Kidney: Renal measurements: 8.4 x 4.3 x 4.1 cm = volume: 77 mL. Increased echogenicity of renal parenchyma is noted suggesting medical renal disease. 2.1 cm simple cyst is noted. No mass or hydronephrosis visualized. Bladder: Decompressed as patient has recently voided. Other: None. IMPRESSION: Increased echogenicity of renal parenchyma is noted bilaterally consistent with medical renal disease. No hydronephrosis or renal obstruction is noted. Mild bilateral renal atrophy is noted. Electronically Signed   By: Marijo Conception M.D.   On: 06/01/2022 17:47   CT ABDOMEN PELVIS WO CONTRAST  Result Date: 05/31/2022 CLINICAL DATA:  Acute right-sided abdominal pain. EXAM: CT ABDOMEN AND PELVIS WITHOUT CONTRAST TECHNIQUE: Multidetector CT imaging of the abdomen and pelvis was performed following the standard protocol without IV contrast. RADIATION DOSE REDUCTION: This exam was performed according to the departmental dose-optimization program which includes automated exposure control, adjustment of the mA and/or kV according to patient size and/or use of iterative reconstruction technique. COMPARISON:  May 07, 2022. FINDINGS: Lower chest: Visualized lung  bases are unremarkable. Large sliding-type hiatal hernia is noted. Hepatobiliary: No focal liver abnormality is seen. No gallstones, gallbladder wall thickening, or biliary dilatation. Pancreas: Unremarkable. No pancreatic ductal dilatation or surrounding inflammatory changes. Spleen: Normal in size without focal abnormality. Adrenals/Urinary Tract: Adrenal glands are unremarkable. Stable left renal cysts are noted for which no further follow-up is required. No hydronephrosis or renal obstruction is noted. No renal or ureteral calculi are noted. Urinary bladder is unremarkable. Stomach/Bowel: Stomach is within normal limits. Appendix appears normal. No evidence of bowel wall thickening, distention, or inflammatory changes. Vascular/Lymphatic: Aortic atherosclerosis. No enlarged abdominal or pelvic lymph nodes. Reproductive: Status post hysterectomy. No adnexal masses. Other: No abdominal wall hernia or abnormality. No abdominopelvic ascites. Musculoskeletal: No acute or significant osseous findings. IMPRESSION: Large sliding-type hiatal hernia. No acute abnormality seen in the abdomen or pelvis. Aortic Atherosclerosis (ICD10-I70.0). Electronically Signed   By: Marijo Conception M.D.   On: 05/31/2022 17:01   DG Lumbar Spine 1 View  Result Date: 05/11/2022 CLINICAL DATA:  Back pain EXAM: LUMBAR SPINE - 1 VIEW COMPARISON:  09/02/2017 FINDINGS: Single AP view is submitted for review. Evaluation of lumbar spine is limited. As far as seen, no definite fractures are noted. Degenerative changes are noted with facet hypertrophy in the lower lumbar spine. Moderate to large amount of stool is seen in ascending and transverse colon. IMPRESSION: Evaluation is limited in this single AP view of lumbar spine. No definite fracture is seen. Degenerative changes are noted in lower lumbar spine. Electronically Signed   By: Elmer Picker M.D.   On: 05/11/2022 15:57   CT ABDOMEN PELVIS WO CONTRAST  Result Date:  05/07/2022 CLINICAL DATA:  Vomiting for 2 days. EXAM: CT ABDOMEN AND PELVIS WITHOUT CONTRAST TECHNIQUE: Multidetector CT imaging of the abdomen and pelvis was performed following the standard protocol without IV contrast. RADIATION DOSE REDUCTION: This exam was performed according to the departmental dose-optimization program which includes automated exposure control, adjustment of the mA and/or kV according to patient size and/or use of iterative reconstruction technique. COMPARISON:  05/02/2022. FINDINGS: Lower chest: No acute abnormality. Hepatobiliary: No focal liver abnormality is seen. No gallstones, gallbladder wall thickening, or biliary dilatation. Pancreas: Unremarkable. No pancreatic ductal dilatation or surrounding inflammatory changes. Spleen: Normal in size without focal abnormality. Adrenals/Urinary Tract: Normal adrenal glands. Kidneys normal in  size, orientation and position. Nearly isoattenuating mass, lateral midpole the left kidney, 2.1 cm. Low-attenuation mass, anterior lower pole of the left kidney, 1.9 cm, consistent with a cyst. No other renal masses, no stones and no hydronephrosis. Normal ureters. Normal bladder. Stomach/Bowel: Moderate-sized hiatal hernia. Stomach otherwise unremarkable. Small bowel and colon are normal in caliber. No wall thickening. No inflammation. Scattered left colon diverticula. Normal appendix visualized. Vascular/Lymphatic: Aortic atherosclerosis. No enlarged lymph nodes. Reproductive: Status post hysterectomy. No adnexal masses. Other: Small fat containing umbilical hernia. Loop of small bowel lies at the entrance to the hernia. No ascites. Musculoskeletal: No fracture or acute finding.  No bone lesion. IMPRESSION: 1. No acute findings within the abdomen or pelvis. 2. Moderate size hiatal hernia. 3. Renal masses, which are stable from the prior CT, better assessed on the prior exam due to the presence of contrast. Renal masses were assessed as cysts with no  follow-up recommended. 4. Aortic atherosclerosis. Electronically Signed   By: Lajean Manes M.D.   On: 05/07/2022 12:22   DG ABD ACUTE 2+V W 1V CHEST  Result Date: 05/06/2022 CLINICAL DATA:  Vomiting with chest pain. EXAM: DG ABDOMEN ACUTE WITH 1 VIEW CHEST COMPARISON:  Chest x-ray 05/06/2022. CT abdomen and pelvis 05/02/2022 FINDINGS: Moderate hiatal hernia is again seen. Cardiomediastinal silhouette is within normal limits. Lungs are clear. No pleural effusion or pneumothorax. There is no free air. Bowel-gas pattern is nonobstructive. There is average stool burden. There are phleboliths in the pelvis. No acute fractures are seen. No suspicious calcifications are identified. IMPRESSION: 1. No acute cardiopulmonary disease. 2. Moderate hiatal hernia. 3. Nonobstructive bowel-gas pattern. Electronically Signed   By: Ronney Asters M.D.   On: 05/06/2022 19:00    Labs: BNP (last 3 results) Recent Labs    06/02/22 0456 06/03/22 0458 06/04/22 0610  BNP 86.5 199.4* 1.7   Basic Metabolic Panel: Recent Labs  Lab 05/31/22 1456 05/31/22 2238 06/01/22 0441 06/02/22 0456 06/03/22 0458 06/04/22 0610  NA 141  --  142 138 137 137  K 4.5  --  3.9 4.2 4.2 4.0  CL 109  --  113* 112* 112* 110  CO2 19*  --  19* 18* 19* 21*  GLUCOSE 133*  --  95 85 107* 89  BUN 35*  --  42* 32* 23 22  CREATININE 2.39*  --  2.86* 2.12* 1.48* 1.58*  CALCIUM 10.3  --  8.5* 8.3* 8.1* 8.6*  MG  --  2.4 2.2  --   --   --   PHOS  --  4.9* 5.3*  --   --   --    Liver Function Tests: Recent Labs  Lab 05/31/22 1456 05/31/22 2238 06/01/22 0441 06/03/22 1200  AST 17 17 14* 14*  ALT '17 14 12 10  '$ ALKPHOS 105 87 71 57  BILITOT 0.8 0.7 1.0 0.3  PROT 8.8* 7.5 6.3* 5.2*  ALBUMIN 4.5 3.9 3.3* 2.5*   Recent Labs  Lab 05/31/22 1456 06/01/22 0441 06/02/22 0456  LIPASE 67* 106* 52*   No results for input(s): "AMMONIA" in the last 168 hours. CBC: Recent Labs  Lab 05/31/22 2238 06/01/22 0441 06/02/22 0456  06/03/22 0458 06/04/22 0610  WBC 8.8 9.5 8.1 8.1 7.6  NEUTROABS 6.5  --   --   --   --   HGB 11.1* 10.0* 9.2* 8.1* 8.6*  HCT 37.3 33.5* 31.1* 27.5* 29.2*  MCV 85.9 86.3 88.4 88.7 87.4  PLT 339 322 281 271 314  Cardiac Enzymes: Recent Labs  Lab 05/31/22 2238  CKTOTAL 62   BNP: Invalid input(s): "POCBNP" CBG: No results for input(s): "GLUCAP" in the last 168 hours. D-Dimer No results for input(s): "DDIMER" in the last 72 hours. Hgb A1c No results for input(s): "HGBA1C" in the last 72 hours. Lipid Profile No results for input(s): "CHOL", "HDL", "LDLCALC", "TRIG", "CHOLHDL", "LDLDIRECT" in the last 72 hours. Thyroid function studies No results for input(s): "TSH", "T4TOTAL", "T3FREE", "THYROIDAB" in the last 72 hours.  Invalid input(s): "FREET3" Anemia work up No results for input(s): "VITAMINB12", "FOLATE", "FERRITIN", "TIBC", "IRON", "RETICCTPCT" in the last 72 hours. Urinalysis    Component Value Date/Time   COLORURINE YELLOW 05/31/2022 1053   APPEARANCEUR CLOUDY (A) 05/31/2022 1053   LABSPEC 1.011 05/31/2022 1053   PHURINE 5.0 05/31/2022 1053   GLUCOSEU NEGATIVE 05/31/2022 1053   HGBUR NEGATIVE 05/31/2022 [place] 05/31/2022 [place] 05/31/2022 1053   PROTEINUR >=300 (A) 05/31/2022 1053   UROBILINOGEN 0.2 07/19/2013 2044   NITRITE NEGATIVE 05/31/2022 1053   LEUKOCYTESUR LARGE (A) 05/31/2022 1053   Sepsis Labs Recent Labs  Lab 06/01/22 0441 06/02/22 0456 06/03/22 0458 06/04/22 0610  WBC 9.5 8.1 8.1 7.6   Microbiology Recent Results (from the past 240 hour(s))  Urine Culture     Status: Abnormal   Collection Time: 05/31/22 10:53 AM   Specimen: Urine, Clean Catch  Result Value Ref Range Status   Specimen Description   Final    URINE, CLEAN CATCH Performed at [facility], [place] [address], [place], [place] [address]    Special Requests   Final    NONE Performed at [facility],  [place] [address] [place], [place] [address]    Culture >=100,000 COLONIES/mL ESCHERICHIA COLI (A)  Final   Report Status 06/02/2022 FINAL  Final   Organism ID, Bacteria ESCHERICHIA COLI (A)  Final      Susceptibility   Escherichia coli - MIC*    AMPICILLIN <=2 SENSITIVE Sensitive     CEFAZOLIN <=4 SENSITIVE Sensitive     CEFEPIME <=0.12 SENSITIVE Sensitive     CEFTRIAXONE <=0.25 SENSITIVE Sensitive     CIPROFLOXACIN <=0.25 SENSITIVE Sensitive     GENTAMICIN <=1 SENSITIVE Sensitive     IMIPENEM <=0.25 SENSITIVE Sensitive     NITROFURANTOIN <=16 SENSITIVE Sensitive     TRIMETH/SULFA <=20 SENSITIVE Sensitive     AMPICILLIN/SULBACTAM <=2 SENSITIVE Sensitive     PIP/TAZO <=4 SENSITIVE Sensitive     * >=100,000 COLONIES/mL ESCHERICHIA COLI     Time coordinating discharge: 35 minutes  SIGNED: Antonieta Pert, MD  [facility] 06/04/2022, 1:42 PM  If 7PM-7AM, please contact night-coverage [email or web address]

## 2022-06-03 NOTE — Progress Notes (Signed)
Physical Therapy Treatment Patient Details Name: MCKENZI BUONOMO MRN: [id number] DOB: [birth year] Today's Date: 06/03/2022   History of Present Illness 71 year old female with hypertension recurrent cystitis hypokalemia, CHF, hyperlipidemia stage IV CKD admitted 05/31/22 with working diagnosis of intractable nausea vomiting dehydration and AKI    PT Comments    Pt is progressing toward acute PT goals this session with increased ambulation distance. Pt ambulated ~157f total with MIN guard-supervision, multiple rest breaks required due to soreness today. Pt will benefit from continued skilled PT to increase independence and maximize safety with mobility.     Recommendations for follow up therapy are one component of a multi-disciplinary discharge planning process, led by the attending physician.  Recommendations may be updated based on patient status, additional functional criteria and insurance authorization.  Follow Up Recommendations  Home health PT     Assistance Recommended at Discharge Frequent or constant Supervision/Assistance  Patient can return home with the following A little help with walking and/or transfers;A little help with bathing/dressing/bathroom;Assistance with cooking/housework;Assist for transportation;Help with stairs or ramp for entrance   Equipment Recommendations  None recommended by PT    Recommendations for Other Services       Precautions / Restrictions Precautions Precautions: Fall Restrictions Weight Bearing Restrictions: No     Mobility  Bed Mobility Overal bed mobility: Needs Assistance Bed Mobility: Supine to Sit     Supine to sit: Supervision, HOB elevated          Transfers Overall transfer level: Needs assistance Equipment used: Rolling walker (2 wheels) Transfers: Sit to/from Stand Sit to Stand: Supervision           General transfer comment: x1 from EOB and x1 from toilet    Ambulation/Gait Ambulation/Gait assistance: Min  guard, Supervision Gait Distance (Feet): 160 Feet Assistive device: Rolling walker (2 wheels) Gait Pattern/deviations: Step-through pattern, Decreased stride length Gait velocity: decreased     General Gait Details: multiple standing rest breaks during ambulation. HR up to 133bpm. 80bpm at end of session. Pt repeating "this is not a good day" with complaints of increased soreness.   Stairs             Wheelchair Mobility    Modified Rankin (Stroke Patients Only)       Balance Overall balance assessment: Needs assistance Sitting-balance support: No upper extremity supported, Feet supported Sitting balance-Leahy Scale: Good     Standing balance support: During functional activity, Reliant on assistive device for balance, Bilateral upper extremity supported Standing balance-Leahy Scale: Fair                              Cognition Arousal/Alertness: Awake/alert Behavior During Therapy: WFL for tasks assessed/performed, Flat affect Overall Cognitive Status: Within Functional Limits for tasks assessed                                          Exercises      General Comments        Pertinent Vitals/Pain Pain Assessment Pain Assessment: Faces Faces Pain Scale: Hurts even more Pain Location: abdomen and back Pain Descriptors / Indicators: Discomfort Pain Intervention(s): Limited activity within patient's tolerance, Monitored during session, Repositioned, Heat applied    Home Living  Prior Function            PT Goals (current goals can now be found in the care plan section) Acute Rehab PT Goals Patient Stated Goal: none stated PT Goal Formulation: With patient Time For Goal Achievement: 06/15/22 Potential to Achieve Goals: Good Progress towards PT goals: Progressing toward goals    Frequency    Min 3X/week      PT Plan Current plan remains appropriate    Co-evaluation               AM-PAC PT "6 Clicks" Mobility   Outcome Measure  Help needed turning from your back to your side while in a flat bed without using bedrails?: None Help needed moving from lying on your back to sitting on the side of a flat bed without using bedrails?: None Help needed moving to and from a bed to a chair (including a wheelchair)?: None Help needed standing up from a chair using your arms (e.g., wheelchair or bedside chair)?: A Little Help needed to walk in hospital room?: A Little Help needed climbing 3-5 steps with a railing? : A Lot 6 Click Score: 20    End of Session Equipment Utilized During Treatment: Gait belt Activity Tolerance: Patient tolerated treatment well;Patient limited by pain Patient left: in chair;with call bell/phone within reach;with chair alarm set Nurse Communication: Mobility status PT Visit Diagnosis: Muscle weakness (generalized) (M62.81);Difficulty in walking, not elsewhere classified (R26.2);Pain Pain - Right/Left: Right (and left) Pain - part of body:  (back and abdomen)     Time: 8756-4332 PT Time Calculation (min) (ACUTE ONLY): 21 min  Charges:  $Therapeutic Activity: 8-22 mins                     Festus Barren PT, DPT  Acute Rehabilitation Services  Office [phone]  06/03/2022, 3:46 PM

## 2022-06-03 NOTE — Progress Notes (Signed)
PROGRESS NOTE Tamara Friedman  JWJ:[id number] DOB: 23-Jul-1951 DOA: 05/31/2022 PCP: Lucianne Lei, MD   Brief Narrative/Hospital Course: 71 year old female with hypertension recurrent cystitis hypokalemia, CHF, hyperlipidemia stage IV CKDIIIb b/l creat~1.5 05/11/22 admitted with working diagnosis of intractable nausea vomiting dehydration and AKI  She presented to ED 10/31 with nausea and vomiting x1 day without diarrhea, pain similar complaints in the setting of UTI, with occasional hematuria.  In the ED blood pressure 172-180s heart rate 105 Labs bicarb 20 creatinine 2.3, lipase 67 stable CBC UA WBC more than 50 LE large nitrate negative, CT abdomen pelvis without contrast large sliding-type hiatal hernia. Diet was advanced seen by nephrology managed with IV fluids, creatinine improved to baseline,    Subjective:  Seen and examined this morning patient vomited also became tachycardic still complains of generalized abdominal pain discomfort no BM since admission  Denies chest pain fever chills  Assessment and Plan: Principal Problem:   Acute renal failure superimposed on stage 4 chronic kidney disease (HCC) Active Problems:   Intractable vomiting with nausea   HTN (hypertension)   Chronic CHF (HCC)   Dehydration   Dyslipidemia   AKI (acute kidney injury) ([place])   UTI (urinary tract infection)   Malnutrition of moderate degree   E. coli UTI (urinary tract infection)   AKI on AOZ3Y Metabolic acidosis Dehydration: B/l bun/creat as below ~1.5> recent AKI up to 2.5 on 10/6. Aki 2/2 prerenal due to intractable nausea vomiting poor intake.AKI has resolved renal function back to baseline, encourage p.o. intake but due to vomiting keep on gentle IV fluid hydration.  Recent Labs    05/02/22 1138 05/06/22 1827 05/07/22 0707 05/07/22 1222 05/09/22 0335 05/10/22 0337 05/11/22 0328 05/31/22 1456 06/01/22 0441 06/02/22 0456 06/03/22 0458  BUN 34* 50*  --  43* '23 17 22 '$ 35* 42* 32* 23   CREATININE 1.58* 2.50* 2.40* 2.03* 1.78* 1.53* 1.52* 2.39* 2.86* 2.12* 1.48*    Hyperphosphatemia on CKD.Monitor Intractable nausea vomiting:CT showing large sliding type hiatal hernia.  Complains of generalized abdominal discomfort> but overall improving continue symptomatic management  antiemetics, given vomiting this morning > increase PPI twice daily, likely constipation is contributing, add dulcolax suppository, cont stool softeners Constipation no BM since admission adding Dulcolax suppository continue aggressive stool softener Elevated lipase, appears chronically elevated? Less likely acute pancreatitis- Ct abdomen with  no acute finding-could be from dehydration.Repeat lipsae in 100- previosuly it has been in 60-100s> 50s  Hypertension:BP is borderline controlled continue Coreg 25 twice daily amlodipine 10 mg and monitor adjust   E. coli UTI: Placed back on Septra due to vomiting.    HLD on statin  Chronic systolic CHF hypovolemic currently, no signs of fluid overload monitor BnP, daily weight net balance-minimize IV fluid hydration at this point. Net IO Since Admission: 6,141.27 mL [06/03/22 1116]  No results for input(s): "PROBNP" in the last 168 hours.   Moderate malnutrition augment diet as below Nutrition Problem: Moderate Malnutrition Etiology: acute illness Signs/Symptoms: mild fat depletion, mild muscle depletion, edema Interventions: Boost Breeze    DVT prophylaxis: SCDs Start: 05/31/22 2142 Code Status:   Code Status: DNR Family Communication: plan of care discussed with patient at bedside. Patient status is: Admitted under observation but will need inpatient because of acute renal failure Level of care: Telemetry  Dispo: The patient is from: lives with herself, grandson checks on her- Elita Quick was updated previously.            Anticipated disposition: HHPT tomorrow if renal  function improves/no nausea vomiting  Objective: Vitals last 24 hrs: Vitals:   06/03/22  0532 06/03/22 0824 06/03/22 1000 06/03/22 1050  BP: (!) 156/78 (!) 151/103  132/83  Pulse: 80 (!) 120  73  Resp: '18 18  20  '$ Temp: 99.1 F (37.3 C) 98.1 F (36.7 C)  98.4 F (36.9 C)  TempSrc: Oral Oral Oral Oral  SpO2: 100% 100%  100%  Weight:      Height:      Weight change:   Physical Examination: General exam:AA,weak,older appearing. HEENT:Oral mucosa moist,Ear/Nose WNL grossly,dentition normal. Respiratory system:Bilaterally clear BS,no use of accessory muscle. Cardiovascular system:S1 & S2 +,regular rate,JVD neg. Gastrointestinal system:Abdomen soft,NT,ND,BS+ Nervous System:Alert, awake, moving extremities and grossly nonfocal Extremities:LE ankle edema neg,lower extremities warm Skin:No rashes,no icterus. YIR:SWNIOE muscle bulk,tone, power    Medications reviewed:  Scheduled Meds:  amLODipine  10 mg Oral Daily   aspirin  81 mg Oral Daily   atorvastatin  10 mg Oral Daily   bisacodyl  10 mg Rectal Once   carvedilol  25 mg Oral BID WC   feeding supplement  1 Container Oral BID BM   influenza vaccine adjuvanted  0.5 mL Intramuscular Tomorrow-1000   melatonin  10 mg Oral QHS   Continuous Infusions:  sodium chloride 100 mL/hr at 06/03/22 1000   cefTRIAXone (ROCEPHIN)  IV       Diet Order             DIET SOFT Room service appropriate? Yes; Fluid consistency: Thin  Diet effective now                   Intake/Output Summary (Last 24 hours) at 06/03/2022 1116 Last data filed at 06/03/2022 0502 Gross per 24 hour  Intake 2438.34 ml  Output --  Net 2438.34 ml   Net IO Since Admission: 6,141.27 mL [06/03/22 1116]  Wt Readings from Last 3 Encounters:  06/02/22 75 kg  05/07/22 72.6 kg  03/12/22 72.6 kg    Unresulted Labs (From admission, onward)     Start     Ordered   06/02/22 7035  Basic metabolic panel  Daily at 5am,   R     Question:  Specimen collection method  Answer:  Lab=Lab collect   06/01/22 0856   06/02/22 0500  CBC  Daily at 5am,   R      Question:  Specimen collection method  Answer:  Lab=Lab collect   06/01/22 0856   06/02/22 0500  Brain natriuretic peptide  Daily at 5am,   R     Question:  Specimen collection method  Answer:  Lab=Lab collect   06/01/22 1411          Data Reviewed: I have personally reviewed following labs and imaging studies Recent Labs  Lab 05/31/22 1456 05/31/22 2238 06/01/22 0441 06/02/22 0456 06/03/22 0458  WBC 7.9 8.8 9.5 8.1 8.1  NEUTROABS  --  6.5  --   --   --   HGB 12.2 11.1* 10.0* 9.2* 8.1*  HCT 39.7 37.3 33.5* 31.1* 27.5*  MCV 83.8 85.9 86.3 88.4 88.7  PLT 480* 339 322 281 009  Basic Metabolic Panel: Recent Labs  Lab 05/31/22 1456 05/31/22 2238 06/01/22 0441 06/02/22 0456 06/03/22 0458  NA 141  --  142 138 137  K 4.5  --  3.9 4.2 4.2  CL 109  --  113* 112* 112*  CO2 19*  --  19* 18* 19*  GLUCOSE 133*  --  95 85 107*  BUN 35*  --  42* 32* 23  CREATININE 2.39*  --  2.86* 2.12* 1.48*  CALCIUM 10.3  --  8.5* 8.3* 8.1*  MG  --  2.4 2.2  --   --   PHOS  --  4.9* 5.3*  --   --   GFR: Estimated Creatinine Clearance: 35.1 mL/min (A) (by C-G formula based on SCr of 1.48 mg/dL (H)). Liver Function Tests: Recent Labs  Lab 05/31/22 1456 05/31/22 2238 06/01/22 0441  AST 17 17 14*  ALT '17 14 12  '$ ALKPHOS 105 87 71  BILITOT 0.8 0.7 1.0  PROT 8.8* 7.5 6.3*  ALBUMIN 4.5 3.9 3.3*   Recent Labs  Lab 05/31/22 1456 06/01/22 0441 06/02/22 0456  LIPASE 67* 106* 52*   Recent Labs  Lab 05/31/22 2019 05/31/22 2238  LATICACIDVEN 1.7 1.6   Recent Results (from the past 240 hour(s))  Urine Culture     Status: Abnormal   Collection Time: 05/31/22 10:53 AM   Specimen: Urine, Clean Catch  Result Value Ref Range Status   Specimen Description   Final    URINE, CLEAN CATCH Performed at [facility], [place] [address], [place], [place] [address]    Special Requests   Final    NONE Performed at [facility], [place] [address], [place],  [place] [address]    Culture >=100,000 COLONIES/mL ESCHERICHIA COLI (A)  Final   Report Status 06/02/2022 FINAL  Final   Organism ID, Bacteria ESCHERICHIA COLI (A)  Final      Susceptibility   Escherichia coli - MIC*    AMPICILLIN <=2 SENSITIVE Sensitive     CEFAZOLIN <=4 SENSITIVE Sensitive     CEFEPIME <=0.12 SENSITIVE Sensitive     CEFTRIAXONE <=0.25 SENSITIVE Sensitive     CIPROFLOXACIN <=0.25 SENSITIVE Sensitive     GENTAMICIN <=1 SENSITIVE Sensitive     IMIPENEM <=0.25 SENSITIVE Sensitive     NITROFURANTOIN <=16 SENSITIVE Sensitive     TRIMETH/SULFA <=20 SENSITIVE Sensitive     AMPICILLIN/SULBACTAM <=2 SENSITIVE Sensitive     PIP/TAZO <=4 SENSITIVE Sensitive     * >=100,000 COLONIES/mL ESCHERICHIA COLI  Antimicrobials: Anti-infectives (From admission, onward)    Start     Dose/Rate Route Frequency Ordered Stop   06/03/22 1800  cefTRIAXone (ROCEPHIN) 1 g in sodium chloride 0.9 % 100 mL IVPB        1 g 200 mL/hr over 30 Minutes Intravenous Every 24 hours 06/03/22 0842 06/06/22 1759   06/03/22 1000  cephALEXin (KEFLEX) capsule 500 mg  Status:  Discontinued        500 mg Oral Every 12 hours 06/02/22 1323 06/03/22 0842   06/01/22 1800  cefTRIAXone (ROCEPHIN) 1 g in sodium chloride 0.9 % 100 mL IVPB        1 g 200 mL/hr over 30 Minutes Intravenous Every 24 hours 05/31/22 2142 06/02/22 1849   05/31/22 1845  cefTRIAXone (ROCEPHIN) 1 g in sodium chloride 0.9 % 100 mL IVPB        1 g 200 mL/hr over 30 Minutes Intravenous  Once 05/31/22 1832 06/01/22 0820     Culture/Microbiology Radiology Studies: US RENAL  Result Date: 06/01/2022 CLINICAL DATA:  Acute kidney injury. EXAM: RENAL / URINARY TRACT ULTRASOUND COMPLETE COMPARISON:  July 24, 2013. FINDINGS: Right Kidney: Renal measurements: 8.0 x 4.8 x 3.8 cm = volume: 78 mL. Increased echogenicity of renal parenchyma is noted suggesting medical renal disease. No mass or  hydronephrosis visualized. Left Kidney: Renal measurements: 8.4 x 4.3 x  4.1 cm = volume: 77 mL. Increased echogenicity of renal parenchyma is noted suggesting medical renal disease. 2.1 cm simple cyst is noted. No mass or hydronephrosis visualized. Bladder: Decompressed as patient has recently voided. Other: None. IMPRESSION: Increased echogenicity of renal parenchyma is noted bilaterally consistent with medical renal disease. No hydronephrosis or renal obstruction is noted. Mild bilateral renal atrophy is noted. Electronically Signed   By: Marijo Conception M.D.   On: 06/01/2022 17:47     LOS: 2 days   Antonieta Pert, MD [facility]  06/03/2022, 11:16 AM

## 2022-06-03 NOTE — Progress Notes (Signed)
Occupational Therapy Treatment Patient Details Name: Tamara Friedman MRN: [id number] DOB: [birth year] Today's Date: 06/03/2022   History of present illness 71 year old female with hypertension recurrent cystitis hypokalemia, CHF, hyperlipidemia stage IV CKD admitted 05/31/22 with working diagnosis of intractable nausea vomiting dehydration and AKI   OT comments  Patient was motivated to participate in the therapy session. She required assist for lower body dressing, given reports of increased back and abdominal pain today. She ambulated in her room using a RW,  performed toileting at bathroom level, and performed grooming in standing at the sink with SBA. She is progressing towards her OT goals. Continue OT plan of care.    Recommendations for follow up therapy are one component of a multi-disciplinary discharge planning process, led by the attending physician.  Recommendations may be updated based on patient status, additional functional criteria and insurance authorization.    Follow Up Recommendations  Home health OT    Assistance Recommended at Discharge Intermittent Supervision/Assistance  Patient can return home with the following  A little help with bathing/dressing/bathroom;Assistance with cooking/housework;Assist for transportation   Equipment Recommendations  None recommended by OT       Precautions / Restrictions Precautions Precautions: Fall Restrictions Weight Bearing Restrictions: No       Mobility Bed Mobility Overal bed mobility: Needs Assistance Bed Mobility: Sit to Supine       Sit to supine: Supervision        Transfers Overall transfer level: Needs assistance Equipment used: Rolling walker (2 wheels) Transfers: Sit to/from Stand Sit to Stand: Supervision                     ADL either performed or assessed with clinical judgement   ADL Overall ADL's : Needs assistance/impaired Eating/Feeding: Independent   Grooming: Wash/dry  hands;Supervision/safety;Standing Grooming Details (indicate cue type and reason): She performed hand washing at the sink.         Upper Body Dressing : Set up;Sitting   Lower Body Dressing: Moderate assistance Lower Body Dressing Details (indicate cue type and reason): Pt required assist to don her socks seated in the chair, given reports of increased back and abdominal pain. Toilet Transfer: Supervision/safety;Requires wide/bariatric;Grab bars;Rolling walker (2 wheels)   Toileting- Clothing Manipulation and Hygiene: Supervision/safety;Sit to/from stand;Cueing for compensatory techniques               Cognition Arousal/Alertness: Awake/alert Behavior During Therapy: WFL for tasks assessed/performed, Flat affect Overall Cognitive Status: Within Functional Limits for tasks assessed                              Pertinent Vitals/ Pain       Pain Assessment Pain Assessment: 0-10 Pain Score: 8  Pain Location: abdomen and back Pain Intervention(s): Other (comment) (Pt reported receiving pain medication prior to the session)         Frequency  Min 2X/week        Progress Toward Goals  OT Goals(current goals can now be found in the care plan section)  Progress towards OT goals: Progressing toward goals  Acute Rehab OT Goals Patient Stated Goal: to get better OT Goal Formulation: With patient Time For Goal Achievement: 06/16/22 Potential to Achieve Goals: Good  Plan Discharge plan remains appropriate       AM-PAC OT "6 Clicks" Daily Activity     Outcome Measure   Help from another person eating meals?: None Help from  another person taking care of personal grooming?: None Help from another person toileting, which includes using toliet, bedpan, or urinal?: A Little Help from another person bathing (including washing, rinsing, drying)?: A Little Help from another person to put on and taking off regular upper body clothing?: None Help from another person  to put on and taking off regular lower body clothing?: A Little 6 Click Score: 21    End of Session Equipment Utilized During Treatment: Rolling walker (2 wheels)  OT Visit Diagnosis: Unsteadiness on feet (R26.81);Muscle weakness (generalized) (M62.81)   Activity Tolerance Patient tolerated treatment well   Patient Left in bed;with call bell/phone within reach;with bed alarm set   Nurse Communication Mobility status        Time: 1001-1034 OT Time Calculation (min): 33 min  Charges: OT General Charges $OT Visit: 1 Visit OT Treatments $Self Care/Home Management : 8-22 mins $Therapeutic Activity: 8-22 mins     [name], OTR/L 06/03/2022, 10:54 AM

## 2022-06-03 NOTE — Consult Note (Signed)
   [facility] CM Inpatient Consult   06/03/2022  [name] 1951-02-12 [id number]  [place] [facility] [ACO] Patient:  Tamara Friedman Liaison remote coverage review for patient at [facility] for transition of care follow up needs  Primary Care Provider:  Lucianne Lei, MD is a provider listed for the Transition of Care follow up   Patient screened for hospitalization with noted high risk score for unplanned readmission risk for readmission less than 30 days and  to assess for potential [place] Management service needs for post Friedman transition for care coordination.  Review of patient's electronic medical record reveals patient is for transitioning home.  Called patient at the number provider in electronic medical record [phone] and HIPAA verified. Explained reason for call for post Friedman follow up, SDOH chart reviewed and up-to-date. Patient verbalized having [facility] RN follow up.  Plan:  Referral request for community care coordination will be made for readmission prevention measures.  Of note, [facility] Care Management/Population Health does not replace or interfere with any arrangements made by the Inpatient Transition of Care team.  For questions contact:   Natividad Brood, RN BSN [place]  [phone] business mobile phone Toll free office [phone]  *[place]  [phone] Fax number: [phone] [email or web address]

## 2022-06-04 ENCOUNTER — Other Ambulatory Visit (HOSPITAL_COMMUNITY): Payer: Self-pay

## 2022-06-04 LAB — BASIC METABOLIC PANEL
Anion gap: 6 (ref 5–15)
BUN: 22 mg/dL (ref 8–23)
CO2: 21 mmol/L — ABNORMAL LOW (ref 22–32)
Calcium: 8.6 mg/dL — ABNORMAL LOW (ref 8.9–10.3)
Chloride: 110 mmol/L (ref 98–111)
Creatinine, Ser: 1.58 mg/dL — ABNORMAL HIGH (ref 0.44–1.00)
GFR, Estimated: 34 mL/min — ABNORMAL LOW (ref >60)
Glucose, Bld: 89 mg/dL (ref 70–99)
Potassium: 4 mmol/L (ref 3.5–5.1)
Sodium: 137 mmol/L (ref 135–145)

## 2022-06-04 LAB — CBC
HCT: 29.2 % — ABNORMAL LOW (ref 36.0–46.0)
Hemoglobin: 8.6 g/dL — ABNORMAL LOW (ref 12.0–15.0)
MCH: 25.7 pg — ABNORMAL LOW (ref 26.0–34.0)
MCHC: 29.5 g/dL — ABNORMAL LOW (ref 30.0–36.0)
MCV: 87.4 fL (ref 80.0–100.0)
Platelets: 314 10*3/uL (ref 150–400)
RBC: 3.34 MIL/uL — ABNORMAL LOW (ref 3.87–5.11)
RDW: 17.1 % — ABNORMAL HIGH (ref 11.5–15.5)
WBC: 7.6 10*3/uL (ref 4.0–10.5)
nRBC: 0 % (ref 0.0–0.2)

## 2022-06-04 LAB — BRAIN NATRIURETIC PEPTIDE: B Natriuretic Peptide: 205.7 pg/mL — ABNORMAL HIGH (ref 0.0–100.0)

## 2022-06-04 MED ORDER — SENNOSIDES-DOCUSATE SODIUM 8.6-50 MG PO TABS
1.0000 | ORAL_TABLET | Freq: Two times a day (BID) | ORAL | 0 refills | Status: AC
  Filled 2022-06-04: qty 14, 7d supply, fill #0

## 2022-06-04 MED ORDER — CEFADROXIL 500 MG PO CAPS
500.0000 mg | ORAL_CAPSULE | Freq: Two times a day (BID) | ORAL | 0 refills | Status: AC
  Filled 2022-06-04: qty 4, 2d supply, fill #0

## 2022-06-04 MED ORDER — PANTOPRAZOLE SODIUM 40 MG PO TBEC
40.0000 mg | DELAYED_RELEASE_TABLET | Freq: Every day | ORAL | 0 refills | Status: DC
  Filled 2022-06-04: qty 30, 30d supply, fill #0

## 2022-06-04 MED ORDER — ONDANSETRON 8 MG PO TBDP
8.0000 mg | ORAL_TABLET | Freq: Three times a day (TID) | ORAL | 0 refills | Status: AC | PRN
  Filled 2022-06-04: qty 30, 10d supply, fill #0

## 2022-06-04 NOTE — Progress Notes (Signed)
   06/03/22 0824  Assess: MEWS Score  Temp 98.1 F (36.7 C)  BP (!) 151/103  MAP (mmHg) 118  Pulse Rate (!) 120  Resp 18  Level of Consciousness Alert  SpO2 100 %  O2 Device Room Air  Assess: MEWS Score  MEWS Temp 0  MEWS Systolic 0  MEWS Pulse 2  MEWS RR 0  MEWS LOC 0  MEWS Score 2  MEWS Score Color Yellow  Assess: if the MEWS score is Yellow or Red  Were vital signs taken at a resting state? Yes  Focused Assessment Change from prior assessment (see assessment flowsheet)  Does the patient meet 2 or more of the SIRS criteria? Yes  Does the patient have a confirmed or suspected source of infection? Yes  Provider and Rapid Response Notified? Yes  MEWS guidelines implemented *See Row Information* Yes  Take Vital Signs  Increase Vital Sign Frequency  Yellow: Q 2hr X 2 then Q 4hr X 2, if remains yellow, continue Q 4hrs  Escalate  MEWS: Escalate Yellow: discuss with charge nurse/RN and consider discussing with provider and RRT  Notify: Charge Nurse/RN  Name of Charge Nurse/RN Notified Megan, RN  Date Charge Nurse/RN Notified 06/03/22  Time Charge Nurse/RN Notified 0827  Notify: Provider  Provider Name/Title Dr. Lupita Leash  Date Provider Notified 06/03/22  Time Provider Notified 0827  Method of Notification Page  Notification Reason Change in status  Provider response See new orders  Date of Provider Response 06/03/22  Time of Provider Response 0840  Assess: SIRS CRITERIA  SIRS Temperature  0  SIRS Pulse 1  SIRS Respirations  0  SIRS WBC 1  SIRS Score Sum  2     06/03/22 0824  Assess: MEWS Score  Temp 98.1 F (36.7 C)  BP (!) 151/103  MAP (mmHg) 118  Pulse Rate (!) 120  Resp 18  Level of Consciousness Alert  SpO2 100 %  O2 Device Room Air  Assess: MEWS Score  MEWS Temp 0  MEWS Systolic 0  MEWS Pulse 2  MEWS RR 0  MEWS LOC 0  MEWS Score 2  MEWS Score Color Yellow  Assess: if the MEWS score is Yellow or Red  Were vital signs taken at a resting state? Yes   Focused Assessment Change from prior assessment (see assessment flowsheet)  Does the patient meet 2 or more of the SIRS criteria? Yes  Does the patient have a confirmed or suspected source of infection? Yes  Provider and Rapid Response Notified? Yes  MEWS guidelines implemented *See Row Information* Yes  Take Vital Signs  Increase Vital Sign Frequency  Yellow: Q 2hr X 2 then Q 4hr X 2, if remains yellow, continue Q 4hrs  Escalate  MEWS: Escalate Yellow: discuss with charge nurse/RN and consider discussing with provider and RRT  Notify: Charge Nurse/RN  Name of Charge Nurse/RN Notified Megan, RN  Date Charge Nurse/RN Notified 06/03/22  Time Charge Nurse/RN Notified 0827  Notify: Provider  Provider Name/Title Dr. Lupita Leash  Date Provider Notified 06/03/22  Time Provider Notified 0827  Method of Notification Page  Notification Reason Change in status  Provider response See new orders  Date of Provider Response 06/03/22  Time of Provider Response 0840  Assess: SIRS CRITERIA  SIRS Temperature  0  SIRS Pulse 1  SIRS Respirations  0  SIRS WBC 1  SIRS Score Sum  2

## 2022-06-06 ENCOUNTER — Telehealth: Payer: Self-pay | Admitting: *Deleted

## 2022-06-06 NOTE — Progress Notes (Signed)
  Care Coordination   Note   06/06/2022 Name: Tamara Friedman MRN: [id number] DOB: [birth year]  Tamara Friedman is a 71 y.o. year old female who sees Lucianne Lei, MD for primary care. I reached out to Erasmo Leventhal by phone today to offer care coordination services.  [name] was given information about Care Coordination services today including:   The Care Coordination services include support from the care team which includes your Nurse Coordinator, Clinical Social Worker, or Pharmacist.  The Care Coordination team is here to help remove barriers to the health concerns and goals most important to you. Care Coordination services are voluntary, and the patient may decline or stop services at any time by request to their care team member.   Care Coordination Consent Status: Patient agreed to services and verbal consent obtained.   Follow up plan:  Telephone appointment with care coordination team member scheduled for:  06/07/22  Encounter Outcome:  Pt. Scheduled  [place]  Direct Dial: [phone]

## 2022-06-07 ENCOUNTER — Ambulatory Visit: Payer: Self-pay

## 2022-06-07 DIAGNOSIS — E782 Mixed hyperlipidemia: Secondary | ICD-10-CM | POA: Diagnosis not present

## 2022-06-07 DIAGNOSIS — Q401 Congenital hiatus hernia: Secondary | ICD-10-CM | POA: Diagnosis not present

## 2022-06-07 DIAGNOSIS — N1832 Chronic kidney disease, stage 3b: Secondary | ICD-10-CM | POA: Diagnosis not present

## 2022-06-07 DIAGNOSIS — I129 Hypertensive chronic kidney disease with stage 1 through stage 4 chronic kidney disease, or unspecified chronic kidney disease: Secondary | ICD-10-CM | POA: Diagnosis not present

## 2022-06-07 NOTE — Patient Outreach (Signed)
  Care Coordination   06/07/2022 Name: Tamara Friedman MRN: [id number] DOB: July 21, 1951   Care Coordination Outreach Attempts:  An unsuccessful telephone outreach was attempted for a scheduled appointment today.  Follow Up Plan:  Additional outreach attempts will be made to offer the patient care coordination information and services.   Encounter Outcome:  No Answer  Care Coordination Interventions Activated:  No   Care Coordination Interventions:  No, not indicated    Barb Merino, RN, BSN, CCM Care Management Coordinator [facility] Care Management  Direct Phone: [phone]

## 2022-06-21 DIAGNOSIS — K219 Gastro-esophageal reflux disease without esophagitis: Secondary | ICD-10-CM | POA: Diagnosis not present

## 2022-06-21 DIAGNOSIS — I10 Essential (primary) hypertension: Secondary | ICD-10-CM | POA: Diagnosis not present

## 2022-06-21 DIAGNOSIS — E782 Mixed hyperlipidemia: Secondary | ICD-10-CM | POA: Diagnosis not present

## 2022-06-27 DIAGNOSIS — N289 Disorder of kidney and ureter, unspecified: Secondary | ICD-10-CM | POA: Diagnosis not present

## 2022-06-27 DIAGNOSIS — D509 Iron deficiency anemia, unspecified: Secondary | ICD-10-CM | POA: Diagnosis not present

## 2022-06-27 DIAGNOSIS — R110 Nausea: Secondary | ICD-10-CM | POA: Diagnosis not present

## 2022-06-28 ENCOUNTER — Ambulatory Visit: Payer: Self-pay

## 2022-06-28 NOTE — Patient Outreach (Signed)
  Care Coordination   06/28/2022 Name: Tamara Friedman MRN: [id number] DOB: Mar 18, 1951   Care Coordination Outreach Attempts:  An unsuccessful telephone outreach was attempted for a scheduled appointment today.  Follow Up Plan:  Additional outreach attempts will be made to offer the patient care coordination information and services.   Encounter Outcome:  No Answer   Care Coordination Interventions:  No, not indicated    Barb Merino, RN, BSN, CCM Care Management Coordinator [facility] Care Management Direct Phone: [phone]

## 2022-06-30 DIAGNOSIS — E785 Hyperlipidemia, unspecified: Secondary | ICD-10-CM | POA: Diagnosis not present

## 2022-06-30 DIAGNOSIS — I10 Essential (primary) hypertension: Secondary | ICD-10-CM | POA: Diagnosis not present

## 2022-07-05 DIAGNOSIS — D509 Iron deficiency anemia, unspecified: Secondary | ICD-10-CM | POA: Diagnosis not present

## 2022-07-05 DIAGNOSIS — K5730 Diverticulosis of large intestine without perforation or abscess without bleeding: Secondary | ICD-10-CM | POA: Diagnosis not present

## 2022-07-05 DIAGNOSIS — K449 Diaphragmatic hernia without obstruction or gangrene: Secondary | ICD-10-CM | POA: Diagnosis not present

## 2022-07-05 DIAGNOSIS — K635 Polyp of colon: Secondary | ICD-10-CM | POA: Diagnosis not present

## 2022-07-05 DIAGNOSIS — D122 Benign neoplasm of ascending colon: Secondary | ICD-10-CM | POA: Diagnosis not present

## 2022-07-12 ENCOUNTER — Ambulatory Visit: Payer: Self-pay

## 2022-07-12 NOTE — Patient Instructions (Addendum)
Visit Information  Thank you for taking time to visit with me today. Please don't hesitate to contact me if I can be of assistance to you.   Following are the goals we discussed today:   Goals Addressed               This Visit's Progress     Patient Stated     Nurse Care Coordination Activities: further follow up needed (pt-stated)        Care Coordination Interventions: Determined patient is interested in participating in the [facility] program Discussed patient would like a call back due to having worsening abdominal pain today Educated patient about the [facility] CC program and scheduled next RN CC call with patient            Our next appointment is by telephone on 07/26/22 at 12 PM  Please call the care guide team at [phone] if you need to cancel or reschedule your appointment.   If you are experiencing a Mental Health or [place] or need someone to talk to, please call 1-800-273-TALK (toll free, 24 hour hotline)  The patient verbalized understanding of instructions, educational materials, and care plan provided today and agreed to receive a mailed copy of patient instructions, educational materials, and care plan.   Barb Merino, RN, BSN, CCM Care Management Coordinator [facility] Care Management Direct Phone: [phone]

## 2022-07-12 NOTE — Patient Outreach (Addendum)
  Care Coordination   Initial Visit Note   07/12/2022 Name: Tamara Friedman MRN: [id number] DOB: 1950/08/28  Tamara Friedman is a 71 y.o. year old female who sees Lucianne Lei, MD for primary care. I spoke with  Tamara Friedman by phone today.  What matters to the patients health and wellness today?  Patient would like a call back to discuss how to better manage her health conditions.     Goals Addressed               This Visit's Progress     Patient Stated     Nurse Care Coordination Activities: further follow up needed (pt-stated)        Care Coordination Interventions: Determined patient is interested in participating in the [facility] program Discussed patient would like a call back due to having worsening abdominal pain today Educated patient about the [facility] CC program and scheduled next RN CC call with patient            SDOH assessments and interventions completed:  No     Care Coordination Interventions:  Yes, provided   Follow up plan: Follow up call scheduled for 07/26/22 '@12'$  PM    Encounter Outcome:  Pt. Visit Completed

## 2022-07-20 DIAGNOSIS — I10 Essential (primary) hypertension: Secondary | ICD-10-CM | POA: Diagnosis not present

## 2022-07-20 DIAGNOSIS — I5020 Unspecified systolic (congestive) heart failure: Secondary | ICD-10-CM | POA: Diagnosis not present

## 2022-07-20 DIAGNOSIS — N189 Chronic kidney disease, unspecified: Secondary | ICD-10-CM | POA: Diagnosis not present

## 2022-07-20 DIAGNOSIS — E785 Hyperlipidemia, unspecified: Secondary | ICD-10-CM | POA: Diagnosis not present

## 2022-07-21 ENCOUNTER — Other Ambulatory Visit: Payer: Self-pay | Admitting: Cardiology

## 2022-07-21 DIAGNOSIS — I10 Essential (primary) hypertension: Secondary | ICD-10-CM | POA: Diagnosis not present

## 2022-07-21 DIAGNOSIS — Z1231 Encounter for screening mammogram for malignant neoplasm of breast: Secondary | ICD-10-CM

## 2022-07-21 DIAGNOSIS — E785 Hyperlipidemia, unspecified: Secondary | ICD-10-CM | POA: Diagnosis not present

## 2022-07-21 DIAGNOSIS — N189 Chronic kidney disease, unspecified: Secondary | ICD-10-CM | POA: Diagnosis not present

## 2022-08-04 NOTE — Progress Notes (Signed)
Unable to reschedule   [place]  Direct Dial: [phone]

## 2022-08-30 DIAGNOSIS — I10 Essential (primary) hypertension: Secondary | ICD-10-CM | POA: Diagnosis not present

## 2022-08-30 DIAGNOSIS — E785 Hyperlipidemia, unspecified: Secondary | ICD-10-CM | POA: Diagnosis not present

## 2022-09-13 ENCOUNTER — Ambulatory Visit: Payer: Medicare Other

## 2022-10-01 IMAGING — CT CT ABD-PELV W/O CM
2 of 4 series · 16 of 46 positions shown, 18 images · non-contrast
Comparison: 11/28/2021

CLINICAL DATA: Left lower quadrant pain



[Series 2: axial st · axial · 0.82mm/px · z∈[+1006,+1411]mm · 13 of 93 slices shown, 15 images]
[im 6/93  soft-tissue]
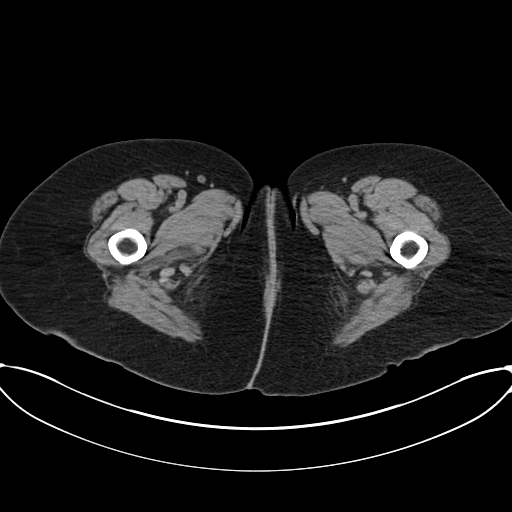
[im 6/93  bone]
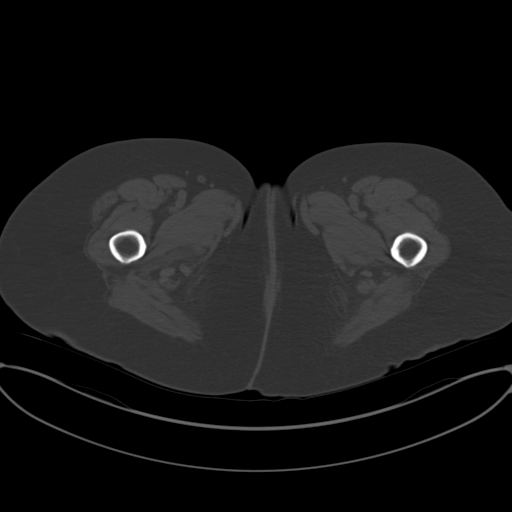
[im 11/93  soft-tissue]
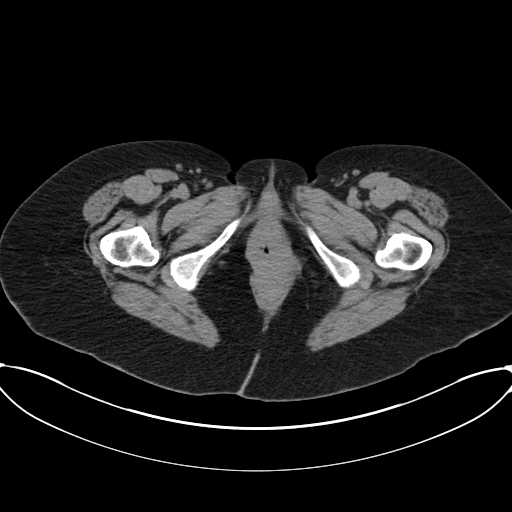
[im 22/93  soft-tissue]
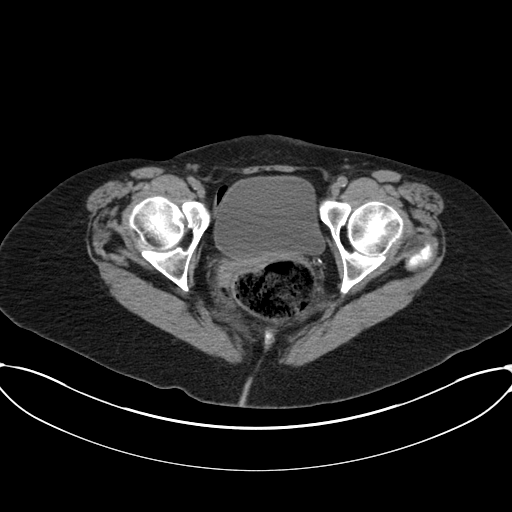
[im 28/93  soft-tissue]
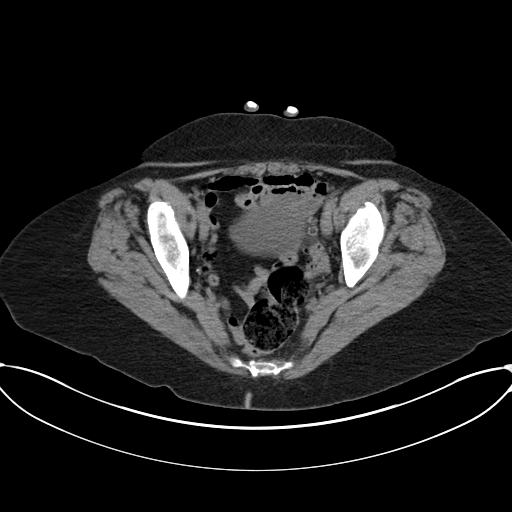
[im 33/93  soft-tissue]
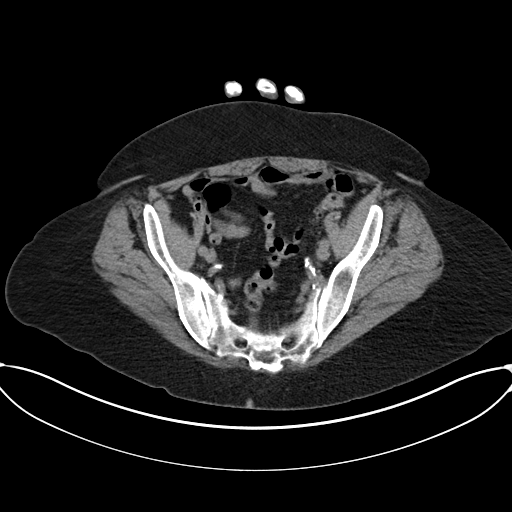
[im 38/93  soft-tissue]
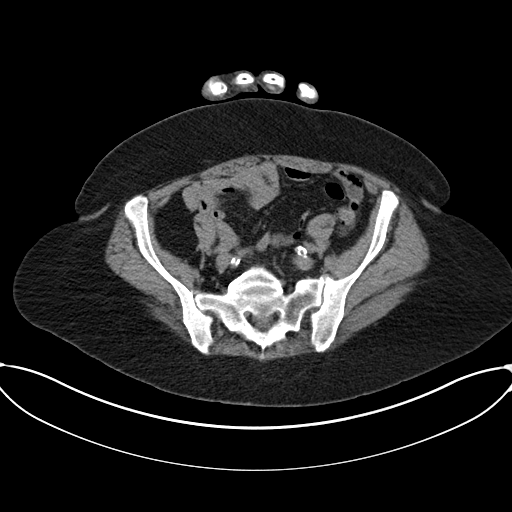
[im 49/93  soft-tissue]
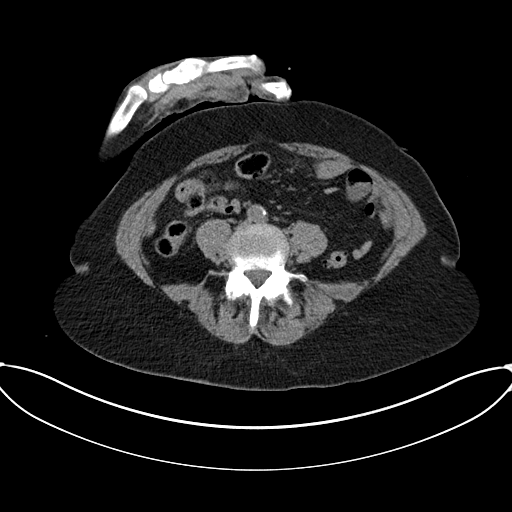
[im 55/93  soft-tissue]
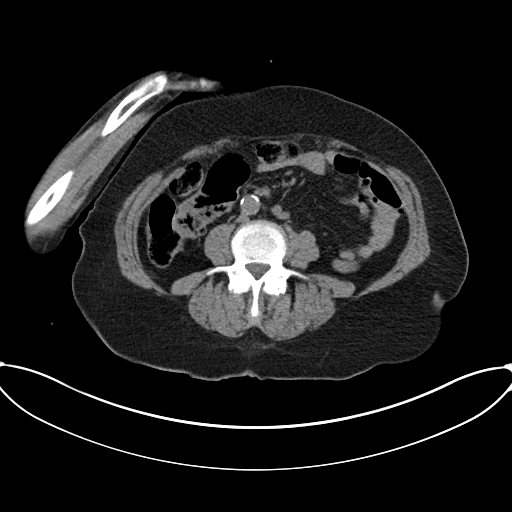
[im 60/93  soft-tissue]
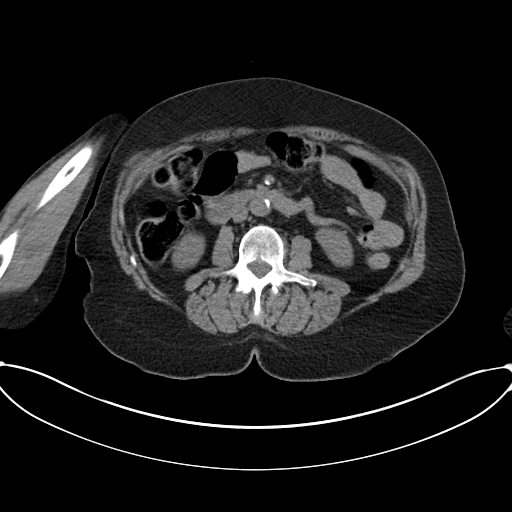
[im 60/93  bone]
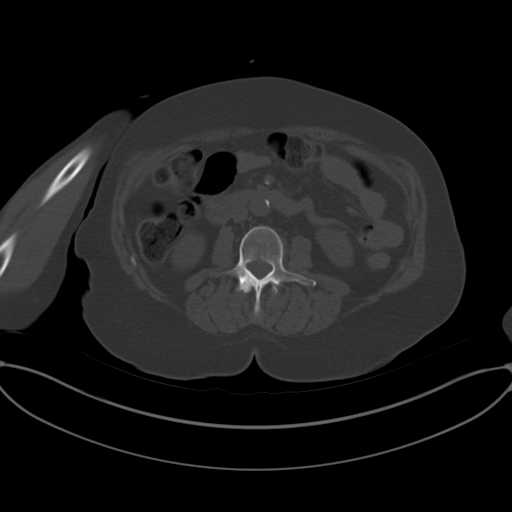
[im 65/93  soft-tissue]
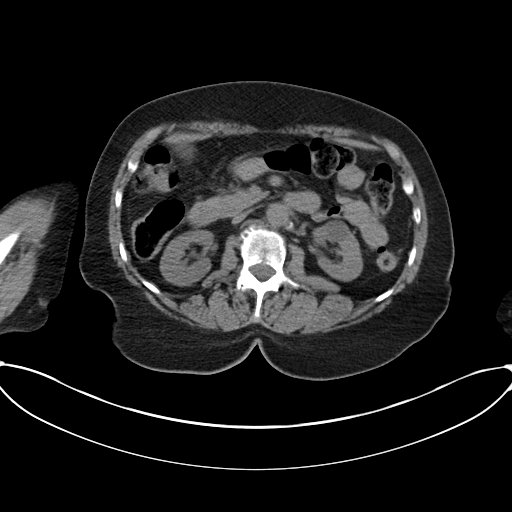
[im 71/93  soft-tissue]
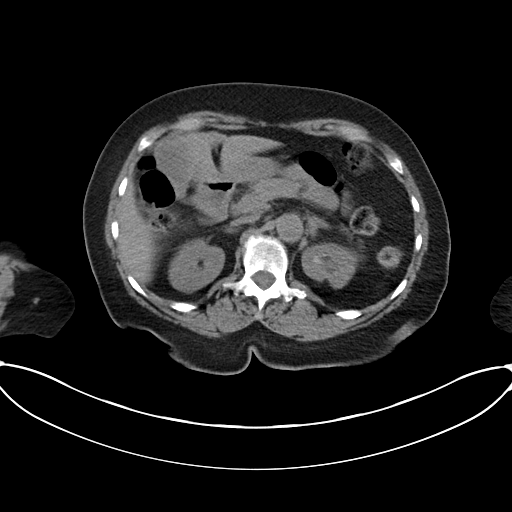
[im 82/93  soft-tissue]
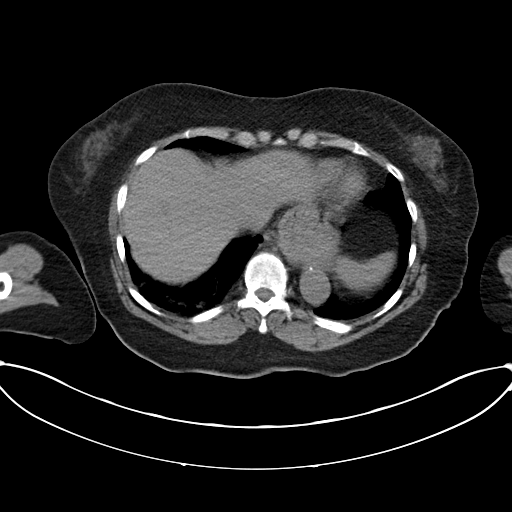
[im 87/93  soft-tissue]
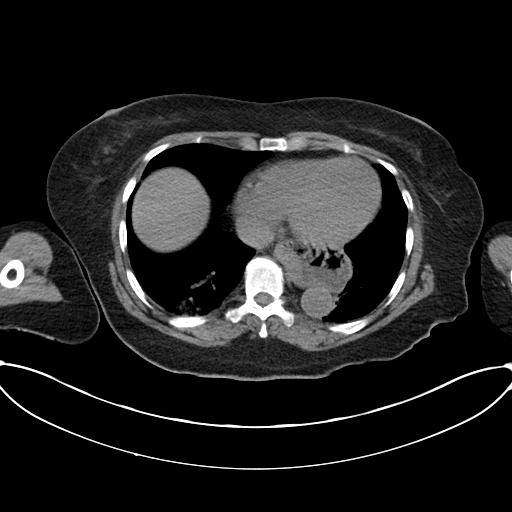

[Series 5: coronal st · coronal · 0.81mm/px · 3 of 143 slices shown]
[im 48/143  soft-tissue]
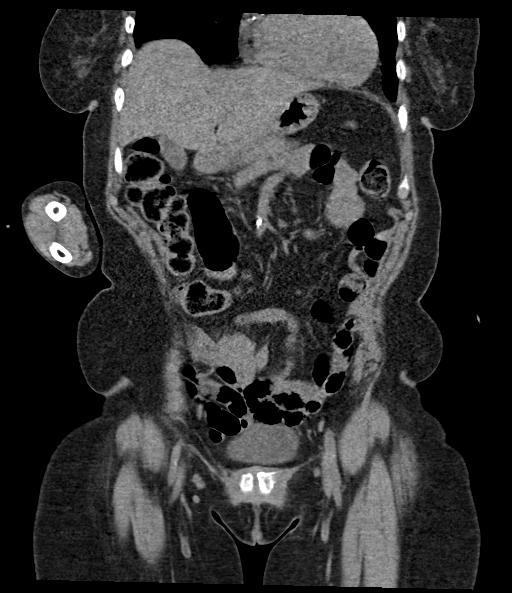
[im 64/143  soft-tissue]
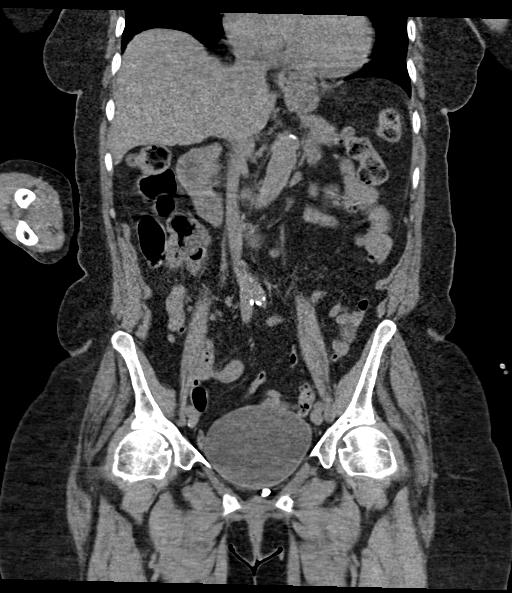
[im 79/143  soft-tissue]
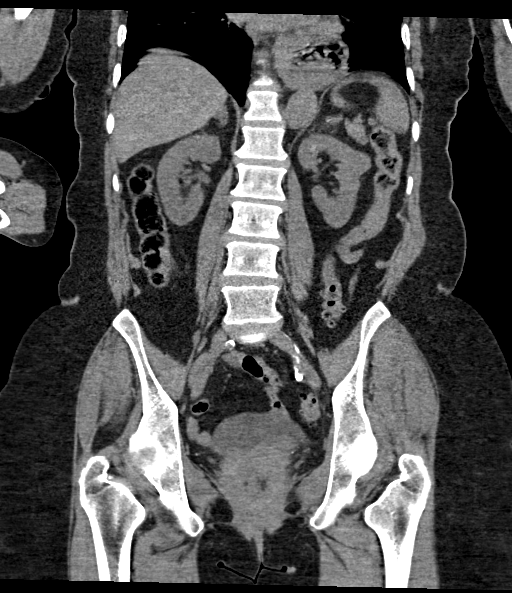

[16 of 46 positions shown; findings below may reference images not displayed]

FINDINGS: Lower chest: Large hiatal hernia. Airspace disease in the right
lower lobe concerning for pneumonia. No effusions.

Hepatobiliary: No focal hepatic abnormality. Gallbladder
unremarkable.

Pancreas: No focal abnormality or ductal dilatation.

Spleen: No focal abnormality.  Normal size.

Adrenals/Urinary Tract: No renal or ureteral stones. No
hydronephrosis. Low-density lesions within the left kidney are
unchanged since prior study and likely reflects cysts. Adrenal
glands and urinary bladder unremarkable.

Stomach/Bowel: Normal appendix. Sigmoid diverticulosis. No active
diverticulitis. Stomach and small bowel decompressed, unremarkable.

Vascular/Lymphatic: Aortic atherosclerosis. No evidence of aneurysm
or adenopathy.

Reproductive: Prior hysterectomy.  No adnexal masses.

Other: No free fluid or free air. Small umbilical hernia containing
fat.

Musculoskeletal: No acute bony abnormality.
IMPRESSION: Large hiatal hernia.

Airspace opacity in the right lung base concerning for pneumonia.

Aortic atherosclerosis.

Sigmoid diverticulosis.

No acute findings in the abdomen or pelvis.

## 2022-10-04 DIAGNOSIS — E782 Mixed hyperlipidemia: Secondary | ICD-10-CM | POA: Diagnosis not present

## 2022-10-04 DIAGNOSIS — I10 Essential (primary) hypertension: Secondary | ICD-10-CM | POA: Diagnosis not present

## 2022-10-04 DIAGNOSIS — I129 Hypertensive chronic kidney disease with stage 1 through stage 4 chronic kidney disease, or unspecified chronic kidney disease: Secondary | ICD-10-CM | POA: Diagnosis not present

## 2022-10-04 DIAGNOSIS — D649 Anemia, unspecified: Secondary | ICD-10-CM | POA: Diagnosis not present

## 2022-10-04 DIAGNOSIS — R739 Hyperglycemia, unspecified: Secondary | ICD-10-CM | POA: Diagnosis not present

## 2022-10-10 ENCOUNTER — Ambulatory Visit: Payer: Medicare Other

## 2022-10-17 DIAGNOSIS — D509 Iron deficiency anemia, unspecified: Secondary | ICD-10-CM | POA: Diagnosis not present

## 2022-11-15 ENCOUNTER — Ambulatory Visit: Payer: Medicare Other

## 2022-12-19 ENCOUNTER — Ambulatory Visit: Payer: Medicare Other

## 2023-02-06 ENCOUNTER — Ambulatory Visit
Admission: RE | Admit: 2023-02-06 | Discharge: 2023-02-06 | Disposition: A | Discharge status: Home / Self Care | Payer: Medicare Other | Source: Ambulatory Visit | Attending: Cardiology | Admitting: Cardiology

## 2023-02-06 DIAGNOSIS — Z1231 Encounter for screening mammogram for malignant neoplasm of breast: Secondary | ICD-10-CM | POA: Diagnosis not present

## 2023-03-03 DIAGNOSIS — I10 Essential (primary) hypertension: Secondary | ICD-10-CM | POA: Diagnosis not present

## 2023-03-03 DIAGNOSIS — I5022 Chronic systolic (congestive) heart failure: Secondary | ICD-10-CM | POA: Diagnosis not present

## 2023-03-03 DIAGNOSIS — E785 Hyperlipidemia, unspecified: Secondary | ICD-10-CM | POA: Diagnosis not present

## 2023-03-03 DIAGNOSIS — N1832 Chronic kidney disease, stage 3b: Secondary | ICD-10-CM | POA: Diagnosis not present

## 2023-03-20 DIAGNOSIS — I129 Hypertensive chronic kidney disease with stage 1 through stage 4 chronic kidney disease, or unspecified chronic kidney disease: Secondary | ICD-10-CM | POA: Diagnosis not present

## 2023-03-20 DIAGNOSIS — R7303 Prediabetes: Secondary | ICD-10-CM | POA: Diagnosis not present

## 2023-03-20 DIAGNOSIS — E782 Mixed hyperlipidemia: Secondary | ICD-10-CM | POA: Diagnosis not present

## 2023-03-20 DIAGNOSIS — N189 Chronic kidney disease, unspecified: Secondary | ICD-10-CM | POA: Diagnosis not present

## 2023-03-20 DIAGNOSIS — I10 Essential (primary) hypertension: Secondary | ICD-10-CM | POA: Diagnosis not present

## 2023-04-10 DIAGNOSIS — L84 Corns and callosities: Secondary | ICD-10-CM | POA: Diagnosis not present

## 2023-04-10 DIAGNOSIS — M79672 Pain in left foot: Secondary | ICD-10-CM | POA: Diagnosis not present

## 2023-04-10 DIAGNOSIS — M79671 Pain in right foot: Secondary | ICD-10-CM | POA: Diagnosis not present

## 2023-04-10 DIAGNOSIS — I739 Peripheral vascular disease, unspecified: Secondary | ICD-10-CM | POA: Diagnosis not present

## 2023-04-10 DIAGNOSIS — L603 Nail dystrophy: Secondary | ICD-10-CM | POA: Diagnosis not present

## 2023-05-01 DIAGNOSIS — N1832 Chronic kidney disease, stage 3b: Secondary | ICD-10-CM | POA: Diagnosis not present

## 2023-05-01 DIAGNOSIS — E785 Hyperlipidemia, unspecified: Secondary | ICD-10-CM | POA: Diagnosis not present

## 2023-05-01 DIAGNOSIS — R739 Hyperglycemia, unspecified: Secondary | ICD-10-CM | POA: Diagnosis not present

## 2023-05-01 DIAGNOSIS — I129 Hypertensive chronic kidney disease with stage 1 through stage 4 chronic kidney disease, or unspecified chronic kidney disease: Secondary | ICD-10-CM | POA: Diagnosis not present

## 2023-05-01 DIAGNOSIS — Z23 Encounter for immunization: Secondary | ICD-10-CM | POA: Diagnosis not present

## 2023-05-04 DIAGNOSIS — H2513 Age-related nuclear cataract, bilateral: Secondary | ICD-10-CM | POA: Diagnosis not present

## 2023-05-04 DIAGNOSIS — H35033 Hypertensive retinopathy, bilateral: Secondary | ICD-10-CM | POA: Diagnosis not present

## 2023-05-22 DIAGNOSIS — E782 Mixed hyperlipidemia: Secondary | ICD-10-CM | POA: Diagnosis not present

## 2023-05-22 DIAGNOSIS — I129 Hypertensive chronic kidney disease with stage 1 through stage 4 chronic kidney disease, or unspecified chronic kidney disease: Secondary | ICD-10-CM | POA: Diagnosis not present

## 2023-05-22 DIAGNOSIS — N1832 Chronic kidney disease, stage 3b: Secondary | ICD-10-CM | POA: Diagnosis not present

## 2023-06-05 DIAGNOSIS — D509 Iron deficiency anemia, unspecified: Secondary | ICD-10-CM | POA: Diagnosis not present

## 2023-07-10 DIAGNOSIS — L603 Nail dystrophy: Secondary | ICD-10-CM | POA: Diagnosis not present

## 2023-07-10 DIAGNOSIS — I739 Peripheral vascular disease, unspecified: Secondary | ICD-10-CM | POA: Diagnosis not present

## 2023-07-10 DIAGNOSIS — L84 Corns and callosities: Secondary | ICD-10-CM | POA: Diagnosis not present

## 2023-08-29 DIAGNOSIS — I10 Essential (primary) hypertension: Secondary | ICD-10-CM | POA: Diagnosis not present

## 2023-09-11 DIAGNOSIS — N1832 Chronic kidney disease, stage 3b: Secondary | ICD-10-CM | POA: Diagnosis not present

## 2023-09-11 DIAGNOSIS — I129 Hypertensive chronic kidney disease with stage 1 through stage 4 chronic kidney disease, or unspecified chronic kidney disease: Secondary | ICD-10-CM | POA: Diagnosis not present

## 2023-09-11 DIAGNOSIS — R7303 Prediabetes: Secondary | ICD-10-CM | POA: Diagnosis not present

## 2023-09-11 DIAGNOSIS — E785 Hyperlipidemia, unspecified: Secondary | ICD-10-CM | POA: Diagnosis not present

## 2023-10-09 DIAGNOSIS — L84 Corns and callosities: Secondary | ICD-10-CM | POA: Diagnosis not present

## 2023-10-09 DIAGNOSIS — I739 Peripheral vascular disease, unspecified: Secondary | ICD-10-CM | POA: Diagnosis not present

## 2023-10-09 DIAGNOSIS — L603 Nail dystrophy: Secondary | ICD-10-CM | POA: Diagnosis not present

## 2023-11-27 DIAGNOSIS — R7303 Prediabetes: Secondary | ICD-10-CM | POA: Diagnosis not present

## 2023-11-27 DIAGNOSIS — E785 Hyperlipidemia, unspecified: Secondary | ICD-10-CM | POA: Diagnosis not present

## 2023-11-27 DIAGNOSIS — I129 Hypertensive chronic kidney disease with stage 1 through stage 4 chronic kidney disease, or unspecified chronic kidney disease: Secondary | ICD-10-CM | POA: Diagnosis not present

## 2023-11-27 DIAGNOSIS — E782 Mixed hyperlipidemia: Secondary | ICD-10-CM | POA: Diagnosis not present

## 2023-11-27 DIAGNOSIS — N1832 Chronic kidney disease, stage 3b: Secondary | ICD-10-CM | POA: Diagnosis not present

## 2024-01-08 ENCOUNTER — Other Ambulatory Visit: Payer: Self-pay | Admitting: Cardiology

## 2024-01-08 DIAGNOSIS — Z1231 Encounter for screening mammogram for malignant neoplasm of breast: Secondary | ICD-10-CM

## 2024-01-09 ENCOUNTER — Encounter: Payer: Self-pay | Admitting: Podiatry

## 2024-01-09 ENCOUNTER — Ambulatory Visit: Payer: Self-pay | Admitting: Podiatry

## 2024-01-09 DIAGNOSIS — M79671 Pain in right foot: Secondary | ICD-10-CM | POA: Diagnosis not present

## 2024-01-09 DIAGNOSIS — B351 Tinea unguium: Secondary | ICD-10-CM

## 2024-01-09 DIAGNOSIS — M79672 Pain in left foot: Secondary | ICD-10-CM | POA: Diagnosis not present

## 2024-01-09 NOTE — Progress Notes (Signed)
 Patient presents for evaluation and treatment of tenderness and some redness around nails feet.  Tenderness around toes with walking and wearing shoes.  Physical exam:  General appearance: Alert, pleasant, and in no acute distress.  Vascular: Pedal pulses: DP 2/4 B/L, PT 0/4 B/L.  Mild edema lower legs bilaterally  Neurological:  No paresthesias or burning noted.  Dermatologic:  Nails thickened, disfigured, discolored 1-5 BL with subungual debris.  Redness and hypertrophic nail folds along nail folds bilaterally but no signs of drainage or infection.  Musculoskeletal:  Hammertoes 2 through 5 bilaterally and hallux abductovalgus deformities bilaterally   Diagnosis: 1. Painful onychomycotic nails 1 through 5 bilaterally. 2. Pain toes 1 through 5 bilaterally.  Plan: Debrided onychomycotic nails 1 through 5 bilaterally.  Return 3 months

## 2024-02-27 ENCOUNTER — Ambulatory Visit
Admission: RE | Admit: 2024-02-27 | Discharge: 2024-02-27 | Disposition: A | Discharge status: Home / Self Care | Source: Ambulatory Visit | Attending: Cardiology | Admitting: Cardiology

## 2024-02-27 DIAGNOSIS — Z1231 Encounter for screening mammogram for malignant neoplasm of breast: Secondary | ICD-10-CM | POA: Diagnosis not present

## 2024-03-13 DIAGNOSIS — I10 Essential (primary) hypertension: Secondary | ICD-10-CM | POA: Diagnosis not present

## 2024-03-13 DIAGNOSIS — N1832 Chronic kidney disease, stage 3b: Secondary | ICD-10-CM | POA: Diagnosis not present

## 2024-03-13 DIAGNOSIS — E785 Hyperlipidemia, unspecified: Secondary | ICD-10-CM | POA: Diagnosis not present

## 2024-04-04 DIAGNOSIS — M170 Bilateral primary osteoarthritis of knee: Secondary | ICD-10-CM | POA: Diagnosis not present

## 2024-04-04 DIAGNOSIS — R262 Difficulty in walking, not elsewhere classified: Secondary | ICD-10-CM | POA: Diagnosis not present

## 2024-04-04 DIAGNOSIS — M25562 Pain in left knee: Secondary | ICD-10-CM | POA: Diagnosis not present

## 2024-04-04 DIAGNOSIS — M25561 Pain in right knee: Secondary | ICD-10-CM | POA: Diagnosis not present

## 2024-04-10 ENCOUNTER — Ambulatory Visit: Admitting: Podiatry

## 2024-05-08 DIAGNOSIS — H2513 Age-related nuclear cataract, bilateral: Secondary | ICD-10-CM | POA: Diagnosis not present

## 2024-05-08 DIAGNOSIS — H35033 Hypertensive retinopathy, bilateral: Secondary | ICD-10-CM | POA: Diagnosis not present

## 2024-05-10 ENCOUNTER — Ambulatory Visit: Admitting: Podiatry

## 2024-05-28 ENCOUNTER — Ambulatory Visit: Admitting: Podiatry

## 2024-06-05 ENCOUNTER — Ambulatory Visit: Admitting: Podiatry

## 2024-06-05 ENCOUNTER — Encounter: Payer: Self-pay | Admitting: Podiatry

## 2024-06-05 DIAGNOSIS — M79672 Pain in left foot: Secondary | ICD-10-CM | POA: Diagnosis not present

## 2024-06-05 DIAGNOSIS — B351 Tinea unguium: Secondary | ICD-10-CM | POA: Diagnosis not present

## 2024-06-05 DIAGNOSIS — M79671 Pain in right foot: Secondary | ICD-10-CM

## 2024-06-05 NOTE — Progress Notes (Signed)
 Patient presents for evaluation and treatment of tenderness and some redness around nails feet.  Tenderness around toes with walking and wearing shoes.  Physical exam:  General appearance: Alert, pleasant, and in no acute distress.  Vascular: Pedal pulses: DP 2/4 B/L, PT 0/4 B/L.  Moderate edema lower legs bilaterally  Neurologic:  Dermatologic:  Nails thickened, disfigured, discolored 1-5 BL with subungual debris.  Redness and hypertrophic nail folds along nail folds bilaterally but no signs of drainage or infection.  Musculoskeletal:     Diagnosis: 1. Painful onychomycotic nails 1 through 5 bilaterally. 2. Pain toes 1 through 5 bilaterally.  Plan: -Debrided onychomycotic nails 1 through 5 bilaterally.  Sharply debrided nails with nail clipper and reduced with a power bur.  Return 3 months RFC

## 2024-06-17 ENCOUNTER — Observation Stay (HOSPITAL_COMMUNITY)
Admission: EM | Admit: 2024-06-17 | Discharge: 2024-06-23 | Disposition: A | Discharge status: Home / Self Care | Attending: Internal Medicine | Admitting: Internal Medicine

## 2024-06-17 ENCOUNTER — Other Ambulatory Visit: Payer: Self-pay

## 2024-06-17 ENCOUNTER — Encounter (HOSPITAL_COMMUNITY): Payer: Self-pay

## 2024-06-17 DIAGNOSIS — K449 Diaphragmatic hernia without obstruction or gangrene: Principal | ICD-10-CM | POA: Insufficient documentation

## 2024-06-17 DIAGNOSIS — Z23 Encounter for immunization: Secondary | ICD-10-CM | POA: Insufficient documentation

## 2024-06-17 DIAGNOSIS — N179 Acute kidney failure, unspecified: Principal | ICD-10-CM | POA: Diagnosis present

## 2024-06-17 DIAGNOSIS — N1832 Chronic kidney disease, stage 3b: Secondary | ICD-10-CM | POA: Insufficient documentation

## 2024-06-17 DIAGNOSIS — D509 Iron deficiency anemia, unspecified: Secondary | ICD-10-CM | POA: Insufficient documentation

## 2024-06-17 DIAGNOSIS — K5730 Diverticulosis of large intestine without perforation or abscess without bleeding: Secondary | ICD-10-CM | POA: Insufficient documentation

## 2024-06-17 DIAGNOSIS — Z79899 Other long term (current) drug therapy: Secondary | ICD-10-CM | POA: Insufficient documentation

## 2024-06-17 DIAGNOSIS — I2510 Atherosclerotic heart disease of native coronary artery without angina pectoris: Secondary | ICD-10-CM | POA: Insufficient documentation

## 2024-06-17 DIAGNOSIS — E611 Iron deficiency: Secondary | ICD-10-CM | POA: Insufficient documentation

## 2024-06-17 DIAGNOSIS — N390 Urinary tract infection, site not specified: Secondary | ICD-10-CM | POA: Insufficient documentation

## 2024-06-17 DIAGNOSIS — R748 Abnormal levels of other serum enzymes: Secondary | ICD-10-CM

## 2024-06-17 DIAGNOSIS — R112 Nausea with vomiting, unspecified: Secondary | ICD-10-CM

## 2024-06-17 DIAGNOSIS — D649 Anemia, unspecified: Secondary | ICD-10-CM

## 2024-06-17 DIAGNOSIS — K3189 Other diseases of stomach and duodenum: Secondary | ICD-10-CM | POA: Insufficient documentation

## 2024-06-17 DIAGNOSIS — Z7982 Long term (current) use of aspirin: Secondary | ICD-10-CM | POA: Insufficient documentation

## 2024-06-17 DIAGNOSIS — I129 Hypertensive chronic kidney disease with stage 1 through stage 4 chronic kidney disease, or unspecified chronic kidney disease: Secondary | ICD-10-CM | POA: Insufficient documentation

## 2024-06-17 DIAGNOSIS — E785 Hyperlipidemia, unspecified: Secondary | ICD-10-CM | POA: Insufficient documentation

## 2024-06-17 LAB — CBG MONITORING, ED
Glucose-Capillary: 113 mg/dL — ABNORMAL HIGH (ref 70–99)
Glucose-Capillary: 83 mg/dL (ref 70–99)

## 2024-06-17 NOTE — ED Triage Notes (Addendum)
 Pt reports not being able to keep anything down x 2 weeks. Pt states that her head, stomach, and back have been hurting as well.

## 2024-06-18 ENCOUNTER — Emergency Department (HOSPITAL_COMMUNITY)

## 2024-06-18 ENCOUNTER — Encounter (HOSPITAL_COMMUNITY): Payer: Self-pay

## 2024-06-18 DIAGNOSIS — N179 Acute kidney failure, unspecified: Secondary | ICD-10-CM | POA: Diagnosis not present

## 2024-06-18 LAB — CBC WITH DIFFERENTIAL/PLATELET
Abs Immature Granulocytes: 0.01 K/uL (ref 0.00–0.07)
Basophils Absolute: 0.1 K/uL (ref 0.0–0.1)
Basophils Relative: 1 %
Eosinophils Absolute: 0.1 K/uL (ref 0.0–0.5)
Eosinophils Relative: 2 %
HCT: 33 % — ABNORMAL LOW (ref 36.0–46.0)
Hemoglobin: 9.5 g/dL — ABNORMAL LOW (ref 12.0–15.0)
Immature Granulocytes: 0 %
Lymphocytes Relative: 36 %
Lymphs Abs: 2.3 K/uL (ref 0.7–4.0)
MCH: 23.4 pg — ABNORMAL LOW (ref 26.0–34.0)
MCHC: 28.8 g/dL — ABNORMAL LOW (ref 30.0–36.0)
MCV: 81.3 fL (ref 80.0–100.0)
Monocytes Absolute: 0.6 K/uL (ref 0.1–1.0)
Monocytes Relative: 9 %
Neutro Abs: 3.2 K/uL (ref 1.7–7.7)
Neutrophils Relative %: 52 %
Platelets: 372 K/uL (ref 150–400)
RBC: 4.06 MIL/uL (ref 3.87–5.11)
RDW: 17.8 % — ABNORMAL HIGH (ref 11.5–15.5)
WBC: 6.3 K/uL (ref 4.0–10.5)
nRBC: 0 % (ref 0.0–0.2)

## 2024-06-18 LAB — URINALYSIS, ROUTINE W REFLEX MICROSCOPIC
Bilirubin Urine: NEGATIVE
Glucose, UA: NEGATIVE mg/dL
Hgb urine dipstick: NEGATIVE
Ketones, ur: NEGATIVE mg/dL
Nitrite: POSITIVE — AB
Protein, ur: NEGATIVE mg/dL
Specific Gravity, Urine: 1.011 (ref 1.005–1.030)
WBC, UA: >50 WBC/hpf (ref 0–5)
pH: 5 (ref 5.0–8.0)

## 2024-06-18 LAB — COMPREHENSIVE METABOLIC PANEL WITH GFR
ALT: 12 U/L (ref 0–44)
AST: 24 U/L (ref 15–41)
Albumin: 4.1 g/dL (ref 3.5–5.0)
Alkaline Phosphatase: 99 U/L (ref 38–126)
Anion gap: 14 (ref 5–15)
BUN: 28 mg/dL — ABNORMAL HIGH (ref 8–23)
CO2: 19 mmol/L — ABNORMAL LOW (ref 22–32)
Calcium: 9.9 mg/dL (ref 8.9–10.3)
Chloride: 106 mmol/L (ref 98–111)
Creatinine, Ser: 2.11 mg/dL — ABNORMAL HIGH (ref 0.44–1.00)
GFR, Estimated: 24 mL/min — ABNORMAL LOW (ref >60)
Glucose, Bld: 91 mg/dL (ref 70–99)
Potassium: 5 mmol/L (ref 3.5–5.1)
Sodium: 138 mmol/L (ref 135–145)
Total Bilirubin: 0.3 mg/dL (ref 0.0–1.2)
Total Protein: 7.4 g/dL (ref 6.5–8.1)

## 2024-06-18 LAB — RESP PANEL BY RT-PCR (RSV, FLU A&B, COVID)  RVPGX2
Influenza A by PCR: NEGATIVE
Influenza B by PCR: NEGATIVE
Resp Syncytial Virus by PCR: NEGATIVE
SARS Coronavirus 2 by RT PCR: NEGATIVE

## 2024-06-18 LAB — LIPASE, BLOOD: Lipase: 140 U/L — ABNORMAL HIGH (ref 11–51)

## 2024-06-18 MED ORDER — ATORVASTATIN CALCIUM 10 MG PO TABS
10.0000 mg | ORAL_TABLET | Freq: Every day | ORAL | Status: DC
  Administered 2024-06-18 – 2024-06-23 (×6): 10 mg via ORAL
  Filled 2024-06-18 (×6): qty 1

## 2024-06-18 MED ORDER — ENOXAPARIN SODIUM 30 MG/0.3ML IJ SOSY
30.0000 mg | PREFILLED_SYRINGE | INTRAMUSCULAR | Status: DC
  Administered 2024-06-18 – 2024-06-19 (×2): 30 mg via SUBCUTANEOUS
  Filled 2024-06-18 (×2): qty 0.3

## 2024-06-18 MED ORDER — ENOXAPARIN SODIUM 40 MG/0.4ML IJ SOSY: 40.0000 mg | PREFILLED_SYRINGE | INTRAMUSCULAR | Status: DC

## 2024-06-18 MED ORDER — PANTOPRAZOLE SODIUM 40 MG PO TBEC
40.0000 mg | DELAYED_RELEASE_TABLET | Freq: Every day | ORAL | Status: DC
  Administered 2024-06-18 – 2024-06-23 (×6): 40 mg via ORAL
  Filled 2024-06-18 (×6): qty 1

## 2024-06-18 MED ORDER — PROCHLORPERAZINE EDISYLATE 10 MG/2ML IJ SOLN
10.0000 mg | Freq: Once | INTRAMUSCULAR | Status: AC
  Administered 2024-06-18: 10 mg via INTRAVENOUS
  Filled 2024-06-18: qty 2

## 2024-06-18 MED ORDER — MORPHINE SULFATE (PF) 4 MG/ML IV SOLN
4.0000 mg | Freq: Once | INTRAVENOUS | Status: AC
  Administered 2024-06-18: 4 mg via INTRAVENOUS
  Filled 2024-06-18: qty 1

## 2024-06-18 MED ORDER — SODIUM CHLORIDE 0.9 % IV BOLUS
1000.0000 mL | Freq: Once | INTRAVENOUS | Status: AC
  Administered 2024-06-18: 1000 mL via INTRAVENOUS

## 2024-06-18 MED ORDER — LACTATED RINGERS IV BOLUS
1000.0000 mL | Freq: Once | INTRAVENOUS | Status: AC
  Administered 2024-06-18: 1000 mL via INTRAVENOUS

## 2024-06-18 MED ORDER — ACETAMINOPHEN 325 MG PO TABS
650.0000 mg | ORAL_TABLET | Freq: Four times a day (QID) | ORAL | Status: DC | PRN
  Administered 2024-06-19: 650 mg via ORAL
  Filled 2024-06-18: qty 2

## 2024-06-18 MED ORDER — MELATONIN 5 MG PO TABS
10.0000 mg | ORAL_TABLET | Freq: Every day | ORAL | Status: DC
  Administered 2024-06-18 – 2024-06-22 (×5): 10 mg via ORAL
  Filled 2024-06-18 (×5): qty 2

## 2024-06-18 MED ORDER — ASPIRIN 81 MG PO CHEW
81.0000 mg | CHEWABLE_TABLET | Freq: Every day | ORAL | Status: DC
  Administered 2024-06-18 – 2024-06-23 (×6): 81 mg via ORAL
  Filled 2024-06-18 (×6): qty 1

## 2024-06-18 MED ORDER — SODIUM CHLORIDE 0.9 % IV SOLN: INTRAVENOUS | Status: AC

## 2024-06-18 MED ORDER — INFLUENZA VAC SPLIT HIGH-DOSE 0.5 ML IM SUSY
0.5000 mL | PREFILLED_SYRINGE | INTRAMUSCULAR | Status: AC
Start: 2024-06-19 — End: 2024-06-23
  Administered 2024-06-23: 0.5 mL via INTRAMUSCULAR
  Filled 2024-06-18: qty 0.5

## 2024-06-18 MED ORDER — ONDANSETRON HCL 4 MG/2ML IJ SOLN
4.0000 mg | Freq: Once | INTRAMUSCULAR | Status: AC
  Administered 2024-06-18: 4 mg via INTRAVENOUS
  Filled 2024-06-18: qty 2

## 2024-06-18 MED ORDER — SODIUM CHLORIDE 0.9 % IV SOLN: INTRAVENOUS | Status: DC

## 2024-06-18 MED ORDER — POLYETHYLENE GLYCOL 3350 17 G PO PACK
17.0000 g | PACK | Freq: Every day | ORAL | Status: DC
  Administered 2024-06-18 – 2024-06-23 (×4): 17 g via ORAL
  Filled 2024-06-18 (×4): qty 1

## 2024-06-18 MED ORDER — ONDANSETRON HCL 4 MG/2ML IJ SOLN
4.0000 mg | Freq: Four times a day (QID) | INTRAMUSCULAR | Status: DC | PRN
  Administered 2024-06-18 – 2024-06-22 (×4): 4 mg via INTRAVENOUS
  Filled 2024-06-18 (×4): qty 2

## 2024-06-18 MED ORDER — CARVEDILOL 25 MG PO TABS
25.0000 mg | ORAL_TABLET | Freq: Two times a day (BID) | ORAL | Status: DC
  Administered 2024-06-18 – 2024-06-23 (×10): 25 mg via ORAL
  Filled 2024-06-18 (×10): qty 1

## 2024-06-18 MED ORDER — SODIUM CHLORIDE 0.9 % IV SOLN
1.0000 g | INTRAVENOUS | Status: DC
  Administered 2024-06-19: 1 g via INTRAVENOUS
  Filled 2024-06-18 (×2): qty 10

## 2024-06-18 MED ORDER — HYDROCODONE-ACETAMINOPHEN 5-325 MG PO TABS
1.0000 | ORAL_TABLET | Freq: Four times a day (QID) | ORAL | Status: DC | PRN
  Administered 2024-06-18 – 2024-06-23 (×12): 1 via ORAL
  Filled 2024-06-18 (×13): qty 1

## 2024-06-18 MED ORDER — SODIUM CHLORIDE 0.9 % IV SOLN
2.0000 g | Freq: Once | INTRAVENOUS | Status: AC
  Administered 2024-06-18: 2 g via INTRAVENOUS
  Filled 2024-06-18: qty 20

## 2024-06-18 MED ORDER — AMLODIPINE BESYLATE 10 MG PO TABS
10.0000 mg | ORAL_TABLET | Freq: Every day | ORAL | Status: DC
  Administered 2024-06-18 – 2024-06-23 (×6): 10 mg via ORAL
  Filled 2024-06-18 (×6): qty 1

## 2024-06-18 NOTE — H&P (Addendum)
 History and Physical    Patient: Tamara Friedman FMW:[id number] DOB: May 17, 1951 DOA: 06/17/2024 DOS: the patient was seen and examined on 06/18/2024 PCP: Benjamine Aland, MD  Patient coming from: Home  Chief Complaint: Nausea and vomiting Chief Complaint  Patient presents with   Emesis   HPI: Tamara Friedman is a 73 y.o. female with medical history significant of hypertension, hyperlipidemia who was otherwise well until within the last week when she started experiencing nausea and vomiting that has been progressively getting worse and has not been able to tolerate any oral intake.  She also has some associated lower abdominal and back pain with intensity 7-9/10 relieved by pain medication administered in the emergency room.  Denies diarrhea, constipation or urinary complaints.  ED course: Upon arrival to the emergency room temperature 97.9, respiratory rate 18, pulse 73, BP 122/94 saturating 100% on room air.  Creatinine was found to be elevated above baseline.  Given intractable nausea and vomiting as well as AKI, [facility] service was contacted to admit patient for further management.  Review of Systems: As mentioned in the history of present illness. All other systems reviewed and are negative. Past Medical History:  Diagnosis Date   High cholesterol    Hypertension    Past Surgical History:  Procedure Laterality Date   PARTIAL HYSTERECTOMY     Social History:  reports that she has never smoked. She has never used smokeless tobacco. She reports that she does not drink alcohol and does not use drugs.  No Known Allergies  History reviewed. No pertinent family history.  Prior to Admission medications   Medication Sig Start Date End Date Taking? Authorizing Provider  albuterol (VENTOLIN HFA) 108 (90 Base) MCG/ACT inhaler Inhale 1-2 puffs into the lungs every 6 (six) hours as needed for wheezing or shortness of breath.    [provider]  amLODipine  (NORVASC ) 10 MG tablet Take 1  tablet (10 mg total) by mouth daily. 12/30/21   Vann, Jessica U, DO  aspirin  81 MG tablet Take 81 mg by mouth daily.     [provider]  atorvastatin  (LIPITOR) 10 MG tablet Take 10 mg by mouth daily.    [provider]  carvedilol  (COREG ) 25 MG tablet Take 25 mg by mouth 2 (two) times daily with a meal.    [provider]  magnesium  hydroxide (MILK OF MAGNESIA) 400 MG/5ML suspension Take 30 mLs by mouth daily as needed for mild constipation. 12/29/21   Vann, Jessica U, DO  Melatonin 10 MG TABS Take 10 mg by mouth at bedtime.    [provider]  Multiple Vitamins-Minerals (MULTIVITAMIN ADULTS) TABS Take 1 tablet by mouth daily after supper.    [provider]  ondansetron  (ZOFRAN -ODT) 8 MG disintegrating tablet Take 1 tablet (8 mg total) by mouth every 8 (eight) hours as needed for up to 30 doses for nausea or vomiting. 06/04/22   Christobal Guadalajara, MD  pantoprazole  (PROTONIX ) 40 MG tablet Take 1 tablet (40 mg total) by mouth daily. 06/04/22 06/05/24  Christobal Guadalajara, MD  polyethylene glycol (MIRALAX  / GLYCOLAX ) 17 g packet Take 17 g by mouth daily. Patient taking differently: Take 17 g by mouth daily as needed for mild constipation. 03/16/22   Krishnan, Gokul, MD  tiZANidine  (ZANAFLEX ) 4 MG tablet Take 1 tablet (4 mg total) by mouth every 8 (eight) hours as needed for muscle spasms. 05/12/22   Antoinette Doe, MD    Physical Exam: Vitals:   06/17/24 2253 06/18/24 0205  BP: (!) 122/94 (!) 175/95  Pulse: 73 68  Resp: 18 17  Temp: 97.9 F (36.6 C)   TempSrc: Oral   SpO2: 100% 100%   General: Elderly female laying in bed in no acute distress HEENT: No abnormality detected Respiratory system: Clear to auscultation bilaterally Cardiovascular system: S1-S2 present Abdominal system: Nontender Musculoskeletal: Moving all extremities bilaterally CNS: Alert and oriented x 3 no lateralizing signs Psych: Normal mood   Data Reviewed:  Abdominal CT scan did not show  any acute intra-abdominal pathology except for large hiatal hernia with majority of the stomach within the thoracic compartment Hyperdense exophytic 2.9 cm lesion arising from the left kidney compatible with a benign hyperdense cyst    Latest Ref Rng & Units 06/17/2024   11:11 PM 06/04/2022    6:10 AM 06/03/2022    4:58 AM  CBC  WBC 4.0 - 10.5 K/uL 6.3  7.6  8.1   Hemoglobin 12.0 - 15.0 g/dL 9.5  8.6  8.1   Hematocrit 36.0 - 46.0 % 33.0  29.2  27.5   Platelets 150 - 400 K/uL 372  314  271        Latest Ref Rng & Units 06/17/2024   11:11 PM 06/04/2022    6:10 AM 06/03/2022    4:58 AM  BMP  Glucose 70 - 99 mg/dL 91  89  892   BUN 8 - 23 mg/dL 28  22  23    Creatinine 0.44 - 1.00 mg/dL 7.88  8.41  8.51   Sodium 135 - 145 mmol/L 138  137  137   Potassium 3.5 - 5.1 mmol/L 5.0  4.0  4.2   Chloride 98 - 111 mmol/L 106  110  112   CO2 22 - 32 mmol/L 19  21  19    Calcium  8.9 - 10.3 mg/dL 9.9  8.6  8.1      Assessment and Plan:   Acute kidney injury secondary to dehydration secondary to vomiting Last known creatinine of 1.5 however presented with creatinine of 2.1 We will continue IV fluid Monitor renal function closely Avoid nephrotoxic medications Renally dose all drugs Continue antibiotic therapy with Zofran   Intractable nausea and vomiting of unclear etiology This could be related to her underlying hiatal hernia. CT abdomen did no show acute pathology but showed large hiatal hernia Continue above management. If N/V persists to consider surgical consult for hiatal hernia management.  Essential hypertension -Continue amlodipine  and carvedilol   Hyperlipidemia -Continue on atorvastatin   Hyperdense exophytic lesion of the left kidneys CT scan of the abdomen showed 2.9 cm hyperdense exophytic lesion concerning for benign cyst According to radiology report this does not need follow up imaging  DVT prophylaxis-placed on Lovenox    Advance Care Planning:   Code Status: Prior full  code  Consults: None  Family Communication: None present at bedside  Severity of Illness: The appropriate patient status for this patient is OBSERVATION. Observation status is judged to be reasonable and necessary in order to provide the required intensity of service to ensure the patient's safety. The patient's presenting symptoms, physical exam findings, and initial radiographic and laboratory data in the context of their medical condition is felt to place them at decreased risk for further clinical deterioration. Furthermore, it is anticipated that the patient will be medically stable for discharge from the hospital within 2 midnights of admission.   Author: Drue ONEIDA Potter, MD 06/18/2024 4:51 AM  For on call review [email or web address].

## 2024-06-18 NOTE — ED Provider Notes (Signed)
 [place] EMERGENCY DEPARTMENT AT [facility] Provider Note   CSN: [id number] Arrival date & time: 06/17/24  2238     Patient presents with: Emesis   Tamara Friedman is a 73 y.o. female.   The history is provided by the patient.  Emesis  She has history of hypertension, hyperlipidemia and comes in complaining of vomiting for the last week.  She has not been able to hold anything down.  She is complaining of pain across her lower abdomen but also complaining of aching in her back and in her legs.  She denies fever, chills, sweats.  She denies constipation or diarrhea.  She does not have any medicine for nausea at home.    Prior to Admission medications   Medication Sig Start Date End Date Taking? Authorizing Provider  albuterol (VENTOLIN HFA) 108 (90 Base) MCG/ACT inhaler Inhale 1-2 puffs into the lungs every 6 (six) hours as needed for wheezing or shortness of breath.    [provider]  amLODipine  (NORVASC ) 10 MG tablet Take 1 tablet (10 mg total) by mouth daily. 12/30/21   Vann, Jessica U, DO  aspirin  81 MG tablet Take 81 mg by mouth daily.     [provider]  atorvastatin  (LIPITOR) 10 MG tablet Take 10 mg by mouth daily.    [provider]  carvedilol  (COREG ) 25 MG tablet Take 25 mg by mouth 2 (two) times daily with a meal.    [provider]  magnesium  hydroxide (MILK OF MAGNESIA) 400 MG/5ML suspension Take 30 mLs by mouth daily as needed for mild constipation. 12/29/21   Vann, Jessica U, DO  Melatonin 10 MG TABS Take 10 mg by mouth at bedtime.    [provider]  Multiple Vitamins-Minerals (MULTIVITAMIN ADULTS) TABS Take 1 tablet by mouth daily after supper.    [provider]  ondansetron  (ZOFRAN -ODT) 8 MG disintegrating tablet Take 1 tablet (8 mg total) by mouth every 8 (eight) hours as needed for up to 30 doses for nausea or vomiting. 06/04/22   Christobal Guadalajara, MD  pantoprazole  (PROTONIX ) 40 MG tablet Take 1 tablet (40  mg total) by mouth daily. 06/04/22 06/05/24  Christobal Guadalajara, MD  polyethylene glycol (MIRALAX  / GLYCOLAX ) 17 g packet Take 17 g by mouth daily. Patient taking differently: Take 17 g by mouth daily as needed for mild constipation. 03/16/22   Krishnan, Gokul, MD  tiZANidine  (ZANAFLEX ) 4 MG tablet Take 1 tablet (4 mg total) by mouth every 8 (eight) hours as needed for muscle spasms. 05/12/22   Antoinette Doe, MD    Allergies: Patient has no known allergies.    Review of Systems  Gastrointestinal:  Positive for vomiting.  All other systems reviewed and are negative.   Updated Vital Signs BP (!) 122/94   Pulse 73   Temp 97.9 F (36.6 C) (Oral)   Resp 18   SpO2 100%   Physical Exam Vitals and nursing note reviewed.   73 year old female, resting comfortably and in no acute distress. Vital signs are significant for elevated diastolic blood pressure. Oxygen saturation is 100%, which is normal. Head is normocephalic and atraumatic. PERRLA, EOMI.  Back is nontender and there is no CVA tenderness. Lungs are clear without rales, wheezes, or rhonchi. Chest is nontender. Heart has regular rate and rhythm without murmur. Abdomen is soft, flat, with mild tenderness in the suprapubic area and right lower quadrant.  There is no rebound or guarding. Skin is warm and dry  without rash. Neurologic: Awake and alert, moves all extremities equally.  (all labs ordered are listed, but only abnormal results are displayed) Labs Reviewed  COMPREHENSIVE METABOLIC PANEL WITH GFR - Abnormal; Notable for the following components:      Result Value   CO2 19 (*)    BUN 28 (*)    Creatinine, Ser 2.11 (*)    GFR, Estimated 24 (*)    All other components within normal limits  LIPASE, BLOOD - Abnormal; Notable for the following components:   Lipase 140 (*)    All other components within normal limits  CBC WITH DIFFERENTIAL/PLATELET - Abnormal; Notable for the following components:   Hemoglobin 9.5 (*)    HCT 33.0  (*)    MCH 23.4 (*)    MCHC 28.8 (*)    RDW 17.8 (*)    All other components within normal limits  URINALYSIS, ROUTINE W REFLEX MICROSCOPIC - Abnormal; Notable for the following components:   APPearance HAZY (*)    Nitrite POSITIVE (*)    Leukocytes,Ua LARGE (*)    Bacteria, UA MANY (*)    All other components within normal limits  CBG MONITORING, ED - Abnormal; Notable for the following components:   Glucose-Capillary 113 (*)    All other components within normal limits  RESP PANEL BY RT-PCR (RSV, FLU A&B, COVID)  [id number]  URINE CULTURE  CBG MONITORING, ED     Radiology: CT ABDOMEN PELVIS WO CONTRAST Result Date: 06/18/2024 EXAM: CT ABDOMEN AND PELVIS WITHOUT CONTRAST 06/18/2024 02:43:50 AM TECHNIQUE: CT of the abdomen and pelvis was performed without the administration of intravenous contrast. Multiplanar reformatted images are provided for review. Automated exposure control, iterative reconstruction, and/or weight-based adjustment of the mA/kV was utilized to reduce the radiation dose to as low as reasonably achievable. COMPARISON: 10/29/2021. CLINICAL HISTORY: RLQ abdominal pain. FINDINGS: LOWER CHEST: Large hiatal hernia with the majority of the stomach within the thoracic compartment. This is unchanged compared to the prior study of 10/29/2021. LIVER: The liver is unremarkable. GALLBLADDER AND BILE DUCTS: Gallbladder is unremarkable. No biliary ductal dilatation. SPLEEN: No acute abnormality. PANCREAS: No acute abnormality. ADRENAL GLANDS: No acute abnormality. KIDNEYS, URETERS AND BLADDER: Hyperdense exophytic 2.9 cm lesion arising from the interpolar region of the left kidney is compatible with hyperdense renal cyst, unchanged compared to 10/29/2021. Additional simple cortical cysts noted within the lower pole. Asymmetric left renal cortical scarring. The kidneys are otherwise unremarkable. Per consensus, no follow-up is needed for simple Bosniak type 1 and 2 renal cysts, unless the  patient has a malignancy history or risk factors. No stones in the kidneys or ureters. No hydronephrosis. No perinephric or periureteral stranding. Urinary bladder is unremarkable. GI AND BOWEL: The stomach is largely within the thoracic compartment due to a large hiatal hernia (as described in LOWER CHEST). Severe descending and sigmoid colon diverticulosis, unchanged compared to 10/29/2021. No acute inflammatory change. The small bowel and the remaining portions of the large bowel are unremarkable. Appendix normal. There is no bowel obstruction. PERITONEUM AND RETROPERITONEUM: No ascites. No free air. VASCULATURE: Aorta is normal in caliber. Moderate aortoiliac atherosclerotic calcification. LYMPH NODES: No lymphadenopathy. REPRODUCTIVE ORGANS: Uterus absent. No adnexal masses. BONES AND SOFT TISSUES: Small fat-containing umbilical hernia. Tiny fat-containing right inguinal hernia. No acute osseous abnormality. IMPRESSION: 1. No acute findings. 2. Large hiatal hernia with the majority of the stomach within the thoracic compartment, unchanged compared to 05/31/2022 3. Severe descending and sigmoid colon diverticulosis without evidence of acute inflammatory  change 4. Hyperdense exophytic 2.9 cm lesion arising from the interpolar region of the left kidney compatible with a benign hyperdense/proteinaceous cyst; additional simple cortical cysts in the lower pole. No follow-up imaging recommended per renal cyst guidelines Electronically signed by: Dorethia Molt MD 06/18/2024 03:08 AM EST RP Workstation: [id number]     Procedures   Medications Ordered in the ED  ondansetron  (ZOFRAN ) injection 4 mg (has no administration in time range)  acetaminophen  (TYLENOL ) tablet 650 mg (has no administration in time range)  cefTRIAXone  (ROCEPHIN ) 2 g in sodium chloride  0.9 % 100 mL IVPB (has no administration in time range)  sodium chloride  0.9 % bolus 1,000 mL (0 mLs Intravenous Stopped 06/18/24 0429)  ondansetron   (ZOFRAN ) injection 4 mg (4 mg Intravenous Given 06/18/24 0159)  morphine  (PF) 4 MG/ML injection 4 mg (4 mg Intravenous Given 06/18/24 0159)  prochlorperazine  (COMPAZINE ) injection 10 mg (10 mg Intravenous Given 06/18/24 0447)                                    Medical Decision Making Amount and/or Complexity of Data Reviewed Labs: ordered. Radiology: ordered.  Risk Prescription drug management. Decision regarding hospitalization.   Nausea and vomiting with lower abdominal pain.  This a presentation with a wide range of treatment options and carries with her high risk of morbidity and complications.  Differential diagnosis includes, but is not limited to, small bowel obstruction, diverticulitis, appendicitis, pancreatitis, cholecystitis.  I have reviewed her past records, and note multiple hospitalizations with similar complaints, most recently on 05/31/2022.  I have ordered IV fluids, morphine  for pain, ondansetron  for nausea.  I have reviewed her laboratory tests, and my interpretation is acute kidney injury with creatinine 2.1 compared with 1.58 on 06/04/2022, metabolic acidosis which is chronic and unchanged from baseline, elevated lipase which had been present in the past-level not high enough to definitively diagnose pancreatitis, anemia which is improved compared with baseline but may be elevated related to hemoconcentration.  I have ordered CT of abdomen and pelvis to rule out diverticulitis and bowel obstruction.  Urinalysis has come back showing evidence of infection with positive nitrite, greater than 50 WBCs, many bacteria, WBC clumps.  I have ordered urine culture and a dose of ceftriaxone .  CT scan shows no acute process.  I have independently viewed the images, and agree with the radiologists interpretation.  I have ordered ongoing fluids for her.  I have discussed case with Dr. Dorinda of [facility], who agrees to admit the patient.     Final diagnoses:  Acute kidney  injury (nontraumatic)  Intractable nausea and vomiting  Elevated lipase  Normochromic normocytic anemia  Urinary tract infection without hematuria, site unspecified    ED Discharge Orders     None          Raford Lenis, MD 06/18/24 [phone]

## 2024-06-18 NOTE — Plan of Care (Signed)

## 2024-06-18 NOTE — Progress Notes (Signed)
 PROGRESS NOTE    Tamara Friedman  FMW:[id number] DOB: 29-Jun-1951 DOA: 06/17/2024 PCP: Benjamine Aland, MD  73/F with large hiatal hernia, hypertension, dyslipidemia presented to the ED with nausea vomiting and lower back pain started 2 to 3 days ago, in the ED she was afebrile, vital stable, creatinine higher than baseline, UA abnormal with positive nitrite and leukocyte esterase, CT abdomen pelvis noted large hiatal hernia, left kidney cystic lesion and diverticulosis   Subjective: -Feels nauseated, lower back pain/flank pain  Assessment and Plan:  Urinary tract infection - Has flank pain, some bladder discomfort, UA significantly abnormal - Start IV ceftriaxone , follow-up urine culture  Acute kidney injury - Likely prerenal from poor p.o. intake, nausea and vomiting -Baseline creatinine around 1.5 - Continue IV fluids today  Large hiatal hernia - Follow-up with general surgery  Essential hypertension Continue amlodipine  and carvedilol   Left kidney exophytic lesion - Per radiologist noted to be a large hyperdense exophytic lesion concerning for benign cyst, no follow-up recommended  Normocytic anemia Stable, monitor   DVT prophylaxis: Lovenox  Code Status: Full code Family Communication: None present Disposition Plan: Home in 1 to 2 days  Consultants:    Procedures:   Antimicrobials:    Objective: Vitals:   06/18/24 0452 06/18/24 0506 06/18/24 0826 06/18/24 0925  BP:  (!) 178/84 (!) 145/63 (!) 142/61  Pulse:  65 (!) 54 (!) 57  Resp:  16 16 16   Temp: 97.6 F (36.4 C)   (!) 97.4 F (36.3 C)  TempSrc:  Oral  Oral  SpO2:   95% 100%    Intake/Output Summary (Last 24 hours) at 06/18/2024 1040 Last data filed at 06/18/2024 0536 Gross per 24 hour  Intake 1098.38 ml  Output --  Net 1098.38 ml   There were no vitals filed for this visit.  Examination:  General exam: Appears calm and comfortable, AAO x 3, no distress Respiratory system: Clear to  auscultation Cardiovascular system: S1 & S2 heard, RRR.  Abd: nondistended, soft and nontender.Normal bowel sounds heard.,  Mild flank tenderness Central nervous system: Alert and oriented. No focal neurological deficits. Extremities: no edema Skin: No rashes Psychiatry:  Mood & affect appropriate.     Data Reviewed:   CBC: Recent Labs  Lab 06/17/24 2311  WBC 6.3  NEUTROABS 3.2  HGB 9.5*  HCT 33.0*  MCV 81.3  PLT 372   Basic Metabolic Panel: Recent Labs  Lab 06/17/24 2311  NA 138  K 5.0  CL 106  CO2 19*  GLUCOSE 91  BUN 28*  CREATININE 2.11*  CALCIUM  9.9   GFR: CrCl cannot be calculated (Unknown ideal weight.). Liver Function Tests: Recent Labs  Lab 06/17/24 2311  AST 24  ALT 12  ALKPHOS 99  BILITOT 0.3  PROT 7.4  ALBUMIN 4.1   Recent Labs  Lab 06/17/24 2311  LIPASE 140*   No results for input(s): AMMONIA in the last 168 hours. Coagulation Profile: No results for input(s): INR, PROTIME in the last 168 hours. Cardiac Enzymes: No results for input(s): CKTOTAL, CKMB, CKMBINDEX, TROPONINI in the last 168 hours. BNP (last 3 results) No results for input(s): PROBNP in the last 8760 hours. HbA1C: No results for input(s): HGBA1C in the last 72 hours. CBG: Recent Labs  Lab 06/17/24 2250 06/17/24 2319  GLUCAP 113* 83   Lipid Profile: No results for input(s): CHOL, HDL, LDLCALC, TRIG, CHOLHDL, LDLDIRECT in the last 72 hours. Thyroid  Function Tests: No results for input(s): TSH, T4TOTAL, FREET4, T3FREE, THYROIDAB in  the last 72 hours. Anemia Panel: No results for input(s): VITAMINB12, FOLATE, FERRITIN, TIBC, IRON, RETICCTPCT in the last 72 hours. Urine analysis:    Component Value Date/Time   COLORURINE YELLOW 06/18/2024 0253   APPEARANCEUR HAZY (A) 06/18/2024 0253   LABSPEC 1.011 06/18/2024 0253   PHURINE 5.0 06/18/2024 0253   GLUCOSEU NEGATIVE 06/18/2024 0253   HGBUR NEGATIVE 06/18/2024 0253    BILIRUBINUR NEGATIVE 06/18/2024 0253   KETONESUR NEGATIVE 06/18/2024 0253   PROTEINUR NEGATIVE 06/18/2024 0253   UROBILINOGEN 0.2 07/19/2013 2044   NITRITE POSITIVE (A) 06/18/2024 0253   LEUKOCYTESUR LARGE (A) 06/18/2024 0253   Sepsis Labs: @LABRCNTIP (procalcitonin:4,lacticidven:4)  ) Recent Results (from the past 240 hours)  Resp panel by RT-PCR (RSV, Flu A&B, Covid) Anterior Nasal Swab     Status: None   Collection Time: 06/17/24 11:11 PM   Specimen: Anterior Nasal Swab  Result Value Ref Range Status   SARS Coronavirus 2 by RT PCR NEGATIVE NEGATIVE Final    Comment: (NOTE) SARS-CoV-2 target nucleic acids are NOT DETECTED.  The SARS-CoV-2 RNA is generally detectable in upper respiratory specimens during the acute phase of infection. The lowest concentration of SARS-CoV-2 viral copies this assay can detect is 138 copies/mL. A negative result does not preclude SARS-Cov-2 infection and should not be used as the sole basis for treatment or other patient management decisions. A negative result may occur with  improper specimen collection/handling, submission of specimen other than nasopharyngeal swab, presence of viral mutation(s) within the areas targeted by this assay, and inadequate number of viral copies(<138 copies/mL). A negative result must be combined with clinical observations, patient history, and epidemiological information. The expected result is Negative.  Fact Sheet for Patients:  bloggercourse.com  Fact Sheet for Healthcare Providers:  seriousbroker.it  This test is no t yet approved or cleared by the [place]  FDA and  has been authorized for detection and/or diagnosis of SARS-CoV-2 by FDA under an Emergency Use Authorization (EUA). This EUA will remain  in effect (meaning this test can be used) for the duration of the COVID-19 declaration under Section 564(b)(1) of the Act, 21 U.S.C.section  360bbb-3(b)(1), unless the authorization is terminated  or revoked sooner.       Influenza A by PCR NEGATIVE NEGATIVE Final   Influenza B by PCR NEGATIVE NEGATIVE Final    Comment: (NOTE) The Xpert Xpress SARS-CoV-2/FLU/RSV plus assay is intended as an aid in the diagnosis of influenza from Nasopharyngeal swab specimens and should not be used as a sole basis for treatment. Nasal washings and aspirates are unacceptable for Xpert Xpress SARS-CoV-2/FLU/RSV testing.  Fact Sheet for Patients: bloggercourse.com  Fact Sheet for Healthcare Providers: seriousbroker.it  This test is not yet approved or cleared by the [place]  FDA and has been authorized for detection and/or diagnosis of SARS-CoV-2 by FDA under an Emergency Use Authorization (EUA). This EUA will remain in effect (meaning this test can be used) for the duration of the COVID-19 declaration under Section 564(b)(1) of the Act, 21 U.S.C. section 360bbb-3(b)(1), unless the authorization is terminated or revoked.     Resp Syncytial Virus by PCR NEGATIVE NEGATIVE Final    Comment: (NOTE) Fact Sheet for Patients: bloggercourse.com  Fact Sheet for Healthcare Providers: seriousbroker.it  This test is not yet approved or cleared by the [place]  FDA and has been authorized for detection and/or diagnosis of SARS-CoV-2 by FDA under an Emergency Use Authorization (EUA). This EUA will remain in effect (meaning this test can be used) for  the duration of the COVID-19 declaration under Section 564(b)(1) of the Act, 21 U.S.C. section 360bbb-3(b)(1), unless the authorization is terminated or revoked.  Performed at [facility], [address], [place], [place] [address]      Radiology Studies: CT ABDOMEN PELVIS WO CONTRAST Result Date: 06/18/2024 EXAM: CT ABDOMEN AND PELVIS WITHOUT CONTRAST 06/18/2024  02:43:50 AM TECHNIQUE: CT of the abdomen and pelvis was performed without the administration of intravenous contrast. Multiplanar reformatted images are provided for review. Automated exposure control, iterative reconstruction, and/or weight-based adjustment of the mA/kV was utilized to reduce the radiation dose to as low as reasonably achievable. COMPARISON: 10/29/2021. CLINICAL HISTORY: RLQ abdominal pain. FINDINGS: LOWER CHEST: Large hiatal hernia with the majority of the stomach within the thoracic compartment. This is unchanged compared to the prior study of 10/29/2021. LIVER: The liver is unremarkable. GALLBLADDER AND BILE DUCTS: Gallbladder is unremarkable. No biliary ductal dilatation. SPLEEN: No acute abnormality. PANCREAS: No acute abnormality. ADRENAL GLANDS: No acute abnormality. KIDNEYS, URETERS AND BLADDER: Hyperdense exophytic 2.9 cm lesion arising from the interpolar region of the left kidney is compatible with hyperdense renal cyst, unchanged compared to 10/29/2021. Additional simple cortical cysts noted within the lower pole. Asymmetric left renal cortical scarring. The kidneys are otherwise unremarkable. Per consensus, no follow-up is needed for simple Bosniak type 1 and 2 renal cysts, unless the patient has a malignancy history or risk factors. No stones in the kidneys or ureters. No hydronephrosis. No perinephric or periureteral stranding. Urinary bladder is unremarkable. GI AND BOWEL: The stomach is largely within the thoracic compartment due to a large hiatal hernia (as described in LOWER CHEST). Severe descending and sigmoid colon diverticulosis, unchanged compared to 10/29/2021. No acute inflammatory change. The small bowel and the remaining portions of the large bowel are unremarkable. Appendix normal. There is no bowel obstruction. PERITONEUM AND RETROPERITONEUM: No ascites. No free air. VASCULATURE: Aorta is normal in caliber. Moderate aortoiliac atherosclerotic calcification. LYMPH  NODES: No lymphadenopathy. REPRODUCTIVE ORGANS: Uterus absent. No adnexal masses. BONES AND SOFT TISSUES: Small fat-containing umbilical hernia. Tiny fat-containing right inguinal hernia. No acute osseous abnormality. IMPRESSION: 1. No acute findings. 2. Large hiatal hernia with the majority of the stomach within the thoracic compartment, unchanged compared to 05/31/2022 3. Severe descending and sigmoid colon diverticulosis without evidence of acute inflammatory change 4. Hyperdense exophytic 2.9 cm lesion arising from the interpolar region of the left kidney compatible with a benign hyperdense/proteinaceous cyst; additional simple cortical cysts in the lower pole. No follow-up imaging recommended per renal cyst guidelines Electronically signed by: Dorethia Molt MD 06/18/2024 03:08 AM EST RP Workstation: [id number]     Scheduled Meds:  amLODipine   10 mg Oral Daily   aspirin   81 mg Oral Daily   atorvastatin   10 mg Oral Daily   carvedilol   25 mg Oral BID WC   enoxaparin  (LOVENOX ) injection  30 mg Subcutaneous Q24H   [START ON 06/19/2024] Influenza vac split trivalent PF  0.5 mL Intramuscular Tomorrow-1000   melatonin  10 mg Oral QHS   pantoprazole   40 mg Oral Daily   polyethylene glycol  17 g Oral Daily   Continuous Infusions:  sodium chloride  100 mL/hr at 06/18/24 1022   [START ON 06/19/2024] cefTRIAXone  (ROCEPHIN )  IV       LOS: 0 days    Time spent:    Sigurd Pac, MD [facility]   06/18/2024, 10:40 AM

## 2024-06-19 DIAGNOSIS — N179 Acute kidney failure, unspecified: Secondary | ICD-10-CM | POA: Diagnosis not present

## 2024-06-19 LAB — RETICULOCYTES
Immature Retic Fract: 27.9 % — ABNORMAL HIGH (ref 2.3–15.9)
RBC.: 3.16 MIL/uL — ABNORMAL LOW (ref 3.87–5.11)
Retic Count, Absolute: 63.8 K/uL (ref 19.0–186.0)
Retic Ct Pct: 2 % (ref 0.4–3.1)

## 2024-06-19 LAB — URINE CULTURE

## 2024-06-19 LAB — FERRITIN: Ferritin: 8 ng/mL — ABNORMAL LOW (ref 11–307)

## 2024-06-19 LAB — CBC
HCT: 26.4 % — ABNORMAL LOW (ref 36.0–46.0)
Hemoglobin: 7.5 g/dL — ABNORMAL LOW (ref 12.0–15.0)
MCH: 23.3 pg — ABNORMAL LOW (ref 26.0–34.0)
MCHC: 28.4 g/dL — ABNORMAL LOW (ref 30.0–36.0)
MCV: 82 fL (ref 80.0–100.0)
Platelets: 285 K/uL (ref 150–400)
RBC: 3.22 MIL/uL — ABNORMAL LOW (ref 3.87–5.11)
RDW: 17.9 % — ABNORMAL HIGH (ref 11.5–15.5)
WBC: 5.7 K/uL (ref 4.0–10.5)
nRBC: 0 % (ref 0.0–0.2)

## 2024-06-19 LAB — IRON AND TIBC
Iron: 15 ug/dL — ABNORMAL LOW (ref 28–170)
Saturation Ratios: 4 % — ABNORMAL LOW (ref 10.4–31.8)
TIBC: 391 ug/dL (ref 250–450)
UIBC: 376 ug/dL

## 2024-06-19 LAB — BASIC METABOLIC PANEL WITH GFR
Anion gap: 9 (ref 5–15)
BUN: 26 mg/dL — ABNORMAL HIGH (ref 8–23)
CO2: 21 mmol/L — ABNORMAL LOW (ref 22–32)
Calcium: 9 mg/dL (ref 8.9–10.3)
Chloride: 108 mmol/L (ref 98–111)
Creatinine, Ser: 1.92 mg/dL — ABNORMAL HIGH (ref 0.44–1.00)
GFR, Estimated: 27 mL/min — ABNORMAL LOW (ref >60)
Glucose, Bld: 91 mg/dL (ref 70–99)
Potassium: 5.1 mmol/L (ref 3.5–5.1)
Sodium: 139 mmol/L (ref 135–145)

## 2024-06-19 LAB — VITAMIN B12: Vitamin B-12: 460 pg/mL (ref 180–914)

## 2024-06-19 LAB — FOLATE: Folate: 7.6 ng/mL (ref >5.9)

## 2024-06-19 MED ORDER — SODIUM CHLORIDE 0.9 % IV SOLN
400.0000 mg | Freq: Once | INTRAVENOUS | Status: AC
  Administered 2024-06-19: 400 mg via INTRAVENOUS
  Filled 2024-06-19: qty 20

## 2024-06-19 NOTE — TOC Initial Note (Signed)
 Transition of Care [facility]) - Initial/Assessment Note    Patient Details  Name: Tamara Friedman MRN: [id number] Date of Birth: June 28, 1951  Transition of Care [facility]) CM/SW Contact:    Doneta Glenys DASEN, RN Phone Number: 06/19/2024, 3:56 PM  Clinical Narrative:                 MOON completed. Presented for AKI. PTA lives in apartment alone;DME-cane,walker;denies HH,oxygen and SDOH needs;Keisha will transport st discharge. No IP CM needs identified during visit.  Expected Discharge Plan: Home/Self Care Barriers to Discharge: No Barriers Identified   Patient Goals and CMS Choice Patient states their goals for this hospitalization and ongoing recovery are:: Home CMS Medicare.gov Compare Post Acute Care list provided to::  (na)   Eva ownership interest in [facility].provided to:: Parent NA    Expected Discharge Plan and Services In-house Referral: NA Discharge Planning Services: CM Consult   Living arrangements for the past 2 months: Apartment                 DME Arranged: N/A DME Agency: NA       HH Arranged: NA HH Agency: NA        Prior Living Arrangements/Services Living arrangements for the past 2 months: Apartment Lives with:: Self Patient language and need for interpreter reviewed:: Yes Do you feel safe going back to the place where you live?: Yes        Care giver support system in place?: Yes (comment) Current home services:  (cane,walker) Criminal Activity/Legal Involvement Pertinent to Current Situation/Hospitalization: No - Comment as needed  Activities of Daily Living   ADL Screening (condition at time of admission) Independently performs ADLs?: Yes (appropriate for developmental age) Is the patient deaf or have difficulty hearing?: No Does the patient have difficulty seeing, even when wearing glasses/contacts?: No Does the patient have difficulty concentrating, remembering, or making decisions?: No  Permission Sought/Granted Permission  sought to share information with : Case Manager Permission granted to share information with : Yes, Verbal Permission Granted  Share Information with NAME: Robynne, Roat (Daughter)  [phone]           Emotional Assessment Appearance:: Appears stated age Attitude/Demeanor/Rapport: Engaged Affect (typically observed): Appropriate Orientation: : Oriented to Self, Oriented to Place, Oriented to  Time, Oriented to Situation Alcohol / Substance Use: Not Applicable Psych Involvement: No (comment)  Admission diagnosis:  Acute kidney injury (nontraumatic) [N17.9] AKI (acute kidney injury) [N17.9] Normochromic normocytic anemia [D64.9] Elevated lipase [R74.8] Intractable nausea and vomiting [R11.2] Urinary tract infection without hematuria, site unspecified [N39.0] Patient Active Problem List   Diagnosis Date Noted   E. coli UTI (urinary tract infection) 06/02/2022   Malnutrition of moderate degree 06/01/2022   Acute renal failure (ARF) 06/01/2022   Pulmonary embolism (HCC) 05/31/2022   UTI (urinary tract infection) 05/31/2022   Hypokalemia 05/08/2022   Acute cystitis 05/07/2022   Family history of malignant neoplasm of digestive organs 05/07/2022   Metabolic acidosis    Abdominal pain 12/25/2021   Abnormal CT scan 12/25/2021   AKI (acute kidney injury) 11/29/2021   Generalized weakness 11/29/2021   Acute pancreatitis 11/28/2021   Dehydration 11/28/2021   Dyslipidemia 11/28/2021   Hypertensive emergency 04/28/2021   Intractable vomiting with nausea 04/27/2021   Acute renal failure superimposed on stage 4 chronic kidney disease (HCC) 04/27/2021   Elevated troponin 04/27/2021   HTN (hypertension) 04/27/2021   Chronic CHF (HCC) 04/27/2021   PCP:  Benjamine Aland, MD Pharmacy:  [facility] [address] ([address] [place] - [address] [place] ([place]) [place] [address] Phone: [phone] Fax: [phone]  DARRYLE LONG - [facility] [address] [place] [address] Phone: [phone] Fax: [phone]     Social Drivers of Health (SDOH) Social History: SDOH Screenings   Food Insecurity: No Food Insecurity (06/18/2024)  Housing: Low Risk  (06/18/2024)  Transportation Needs: No Transportation Needs (06/18/2024)  Utilities: Not At Risk (06/18/2024)  Social Connections: Unknown (06/18/2024)  Tobacco Use: Low Risk  (06/17/2024)   SDOH Interventions:     Readmission Risk Interventions    05/11/2022    1:59 PM  Readmission Risk Prevention Plan  Transportation Screening Complete  PCP or Specialist Appt within 3-5 Days Complete  HRI or Home Care Consult Complete  Social Work Consult for Recovery Care Planning/Counseling Complete  Palliative Care Screening Not Applicable  Medication Review Oceanographer) Complete

## 2024-06-19 NOTE — Care Management Obs Status (Signed)
 MEDICARE OBSERVATION STATUS NOTIFICATION   Patient Details  Name: Tamara Friedman MRN: [id number] Date of Birth: Sep 13, 1950   Medicare Observation Status Notification Given:  Yes    Doneta Glenys DASEN, RN 06/19/2024, 3:54 PM

## 2024-06-19 NOTE — Progress Notes (Signed)
 PROGRESS NOTE    Tamara Friedman  FMW:[id number] DOB: [birth year] DOA: 06/17/2024 PCP: Benjamine Aland, MD  73/F with large hiatal hernia, hypertension, dyslipidemia presented to the ED with nausea vomiting and lower back pain started 2 to 3 days ago, in the ED she was afebrile, vital stable, creatinine higher than baseline, UA abnormal with positive nitrite and leukocyte esterase, CT abdomen pelvis noted large hiatal hernia, left kidney cystic lesion and diverticulosis   Subjective: - Some nausea, flank pain is improving  Assessment and Plan:  Urinary tract infection - Has flank pain, some bladder discomfort, UA significantly abnormal - Continue ceftriaxone  day 2, follow-up urine cultures  Acute kidney injury - Likely prerenal from poor p.o. intake, nausea and vomiting -Baseline creatinine around 1.5 - Stop IV fluids, kidney function is improving  Acute on chronic anemia - Hemoglobin down in the setting of hemodilution, IV fluids, no overt blood loss reported - Anemia panel with severe deficiency, add IV iron  today, follow-up with PCP  Large hiatal hernia - Follow-up with general surgery  Essential hypertension Continue amlodipine  and carvedilol   Left kidney exophytic lesion - Per radiologist noted to be a large hyperdense exophytic lesion concerning for benign cyst, no follow-up recommended   DVT prophylaxis: Lovenox  Code Status: Full code Family Communication: None present Disposition Plan: Home in 1 to 2 days  Consultants:    Procedures:   Antimicrobials:    Objective: Vitals:   06/18/24 1546 06/18/24 1947 06/18/24 2314 06/19/24 0446  BP: 129/61 126/62 (!) 129/59 (!) 140/67  Pulse: 70 73 70 65  Resp: 16 18 16 20   Temp: 98.4 F (36.9 C) 98.2 F (36.8 C) 98.6 F (37 C) 98.6 F (37 C)  TempSrc: Oral Oral Oral Oral  SpO2: 100% 99% 99% 99%    Intake/Output Summary (Last 24 hours) at 06/19/2024 0946 Last data filed at 06/18/2024 1700 Gross per 24 hour   Intake 867 ml  Output --  Net 867 ml   There were no vitals filed for this visit.  Examination:  General exam: Appears calm and comfortable, AAO x 3, no distress Respiratory system: Clear  cardiovascular system: S1-S2, regular rhythm Abd: nondistended, soft and nontender.Normal bowel sounds heard.,  Flank tenderness is improving Central nervous system: Alert and oriented. No focal neurological deficits. Extremities: no edema Skin: No rashes Psychiatry:  Mood & affect appropriate.     Data Reviewed:   CBC: Recent Labs  Lab 06/17/24 2311 06/19/24 0439  WBC 6.3 5.7  NEUTROABS 3.2  --   HGB 9.5* 7.5*  HCT 33.0* 26.4*  MCV 81.3 82.0  PLT 372 285   Basic Metabolic Panel: Recent Labs  Lab 06/17/24 2311 06/19/24 0439  NA 138 139  K 5.0 5.1  CL 106 108  CO2 19* 21*  GLUCOSE 91 91  BUN 28* 26*  CREATININE 2.11* 1.92*  CALCIUM  9.9 9.0   GFR: CrCl cannot be calculated (Unknown ideal weight.). Liver Function Tests: Recent Labs  Lab 06/17/24 2311  AST 24  ALT 12  ALKPHOS 99  BILITOT 0.3  PROT 7.4  ALBUMIN 4.1   Recent Labs  Lab 06/17/24 2311  LIPASE 140*   No results for input(s): AMMONIA in the last 168 hours. Coagulation Profile: No results for input(s): INR, PROTIME in the last 168 hours. Cardiac Enzymes: No results for input(s): CKTOTAL, CKMB, CKMBINDEX, TROPONINI in the last 168 hours. BNP (last 3 results) No results for input(s): PROBNP in the last 8760 hours. HbA1C: No results for  input(s): HGBA1C in the last 72 hours. CBG: Recent Labs  Lab 06/17/24 2250 06/17/24 2319  GLUCAP 113* 83   Lipid Profile: No results for input(s): CHOL, HDL, LDLCALC, TRIG, CHOLHDL, LDLDIRECT in the last 72 hours. Thyroid  Function Tests: No results for input(s): TSH, T4TOTAL, FREET4, T3FREE, THYROIDAB in the last 72 hours. Anemia Panel: Recent Labs    06/19/24 0439  VITAMINB12 460  FOLATE 7.6  FERRITIN 8*  TIBC 391   IRON 15*  RETICCTPCT 2.0   Urine analysis:    Component Value Date/Time   COLORURINE YELLOW 06/18/2024 0253   APPEARANCEUR HAZY (A) 06/18/2024 0253   LABSPEC 1.011 06/18/2024 0253   PHURINE 5.0 06/18/2024 0253   GLUCOSEU NEGATIVE 06/18/2024 0253   HGBUR NEGATIVE 06/18/2024 0253   BILIRUBINUR NEGATIVE 06/18/2024 0253   KETONESUR NEGATIVE 06/18/2024 0253   PROTEINUR NEGATIVE 06/18/2024 0253   UROBILINOGEN 0.2 07/19/2013 2044   NITRITE POSITIVE (A) 06/18/2024 0253   LEUKOCYTESUR LARGE (A) 06/18/2024 0253   Sepsis Labs: @LABRCNTIP (procalcitonin:4,lacticidven:4)  ) Recent Results (from the past 240 hours)  Resp panel by RT-PCR (RSV, Flu A&B, Covid) Anterior Nasal Swab     Status: None   Collection Time: 06/17/24 11:11 PM   Specimen: Anterior Nasal Swab  Result Value Ref Range Status   SARS Coronavirus 2 by RT PCR NEGATIVE NEGATIVE Final    Comment: (NOTE) SARS-CoV-2 target nucleic acids are NOT DETECTED.  The SARS-CoV-2 RNA is generally detectable in upper respiratory specimens during the acute phase of infection. The lowest concentration of SARS-CoV-2 viral copies this assay can detect is 138 copies/mL. A negative result does not preclude SARS-Cov-2 infection and should not be used as the sole basis for treatment or other patient management decisions. A negative result may occur with  improper specimen collection/handling, submission of specimen other than nasopharyngeal swab, presence of viral mutation(s) within the areas targeted by this assay, and inadequate number of viral copies(<138 copies/mL). A negative result must be combined with clinical observations, patient history, and epidemiological information. The expected result is Negative.  Fact Sheet for Patients:  bloggercourse.com  Fact Sheet for Healthcare Providers:  seriousbroker.it  This test is no t yet approved or cleared by the [place]  FDA and  has  been authorized for detection and/or diagnosis of SARS-CoV-2 by FDA under an Emergency Use Authorization (EUA). This EUA will remain  in effect (meaning this test can be used) for the duration of the COVID-19 declaration under Section 564(b)(1) of the Act, 21 U.S.C.section 360bbb-3(b)(1), unless the authorization is terminated  or revoked sooner.       Influenza A by PCR NEGATIVE NEGATIVE Final   Influenza B by PCR NEGATIVE NEGATIVE Final    Comment: (NOTE) The Xpert Xpress SARS-CoV-2/FLU/RSV plus assay is intended as an aid in the diagnosis of influenza from Nasopharyngeal swab specimens and should not be used as a sole basis for treatment. Nasal washings and aspirates are unacceptable for Xpert Xpress SARS-CoV-2/FLU/RSV testing.  Fact Sheet for Patients: bloggercourse.com  Fact Sheet for Healthcare Providers: seriousbroker.it  This test is not yet approved or cleared by the [place]  FDA and has been authorized for detection and/or diagnosis of SARS-CoV-2 by FDA under an Emergency Use Authorization (EUA). This EUA will remain in effect (meaning this test can be used) for the duration of the COVID-19 declaration under Section 564(b)(1) of the Act, 21 U.S.C. section 360bbb-3(b)(1), unless the authorization is terminated or revoked.     Resp Syncytial Virus by PCR NEGATIVE  NEGATIVE Final    Comment: (NOTE) Fact Sheet for Patients: bloggercourse.com  Fact Sheet for Healthcare Providers: seriousbroker.it  This test is not yet approved or cleared by the [place]  FDA and has been authorized for detection and/or diagnosis of SARS-CoV-2 by FDA under an Emergency Use Authorization (EUA). This EUA will remain in effect (meaning this test can be used) for the duration of the COVID-19 declaration under Section 564(b)(1) of the Act, 21 U.S.C. section 360bbb-3(b)(1), unless the  authorization is terminated or revoked.  Performed at [facility], [address] [place], [place] [address]      Radiology Studies: CT ABDOMEN PELVIS WO CONTRAST Result Date: 06/18/2024 EXAM: CT ABDOMEN AND PELVIS WITHOUT CONTRAST 06/18/2024 02:43:50 AM TECHNIQUE: CT of the abdomen and pelvis was performed without the administration of intravenous contrast. Multiplanar reformatted images are provided for review. Automated exposure control, iterative reconstruction, and/or weight-based adjustment of the mA/kV was utilized to reduce the radiation dose to as low as reasonably achievable. COMPARISON: 10/29/2021. CLINICAL HISTORY: RLQ abdominal pain. FINDINGS: LOWER CHEST: Large hiatal hernia with the majority of the stomach within the thoracic compartment. This is unchanged compared to the prior study of 10/29/2021. LIVER: The liver is unremarkable. GALLBLADDER AND BILE DUCTS: Gallbladder is unremarkable. No biliary ductal dilatation. SPLEEN: No acute abnormality. PANCREAS: No acute abnormality. ADRENAL GLANDS: No acute abnormality. KIDNEYS, URETERS AND BLADDER: Hyperdense exophytic 2.9 cm lesion arising from the interpolar region of the left kidney is compatible with hyperdense renal cyst, unchanged compared to 10/29/2021. Additional simple cortical cysts noted within the lower pole. Asymmetric left renal cortical scarring. The kidneys are otherwise unremarkable. Per consensus, no follow-up is needed for simple Bosniak type 1 and 2 renal cysts, unless the patient has a malignancy history or risk factors. No stones in the kidneys or ureters. No hydronephrosis. No perinephric or periureteral stranding. Urinary bladder is unremarkable. GI AND BOWEL: The stomach is largely within the thoracic compartment due to a large hiatal hernia (as described in LOWER CHEST). Severe descending and sigmoid colon diverticulosis, unchanged compared to 10/29/2021. No acute inflammatory change. The small  bowel and the remaining portions of the large bowel are unremarkable. Appendix normal. There is no bowel obstruction. PERITONEUM AND RETROPERITONEUM: No ascites. No free air. VASCULATURE: Aorta is normal in caliber. Moderate aortoiliac atherosclerotic calcification. LYMPH NODES: No lymphadenopathy. REPRODUCTIVE ORGANS: Uterus absent. No adnexal masses. BONES AND SOFT TISSUES: Small fat-containing umbilical hernia. Tiny fat-containing right inguinal hernia. No acute osseous abnormality. IMPRESSION: 1. No acute findings. 2. Large hiatal hernia with the majority of the stomach within the thoracic compartment, unchanged compared to 05/31/2022 3. Severe descending and sigmoid colon diverticulosis without evidence of acute inflammatory change 4. Hyperdense exophytic 2.9 cm lesion arising from the interpolar region of the left kidney compatible with a benign hyperdense/proteinaceous cyst; additional simple cortical cysts in the lower pole. No follow-up imaging recommended per renal cyst guidelines Electronically signed by: Dorethia Molt MD 06/18/2024 03:08 AM EST RP Workstation: [id number]     Scheduled Meds:  amLODipine   10 mg Oral Daily   aspirin   81 mg Oral Daily   atorvastatin   10 mg Oral Daily   carvedilol   25 mg Oral BID WC   enoxaparin  (LOVENOX ) injection  30 mg Subcutaneous Q24H   Influenza vac split trivalent PF  0.5 mL Intramuscular Tomorrow-1000   melatonin  10 mg Oral QHS   pantoprazole   40 mg Oral Daily   polyethylene glycol  17 g Oral Daily  Continuous Infusions:  cefTRIAXone  (ROCEPHIN )  IV       LOS: 0 days    Time spent:    Sigurd Pac, MD [facility]   06/19/2024, 9:46 AM

## 2024-06-19 NOTE — Progress Notes (Addendum)
 Pt still refusing nonskid socks after RN reinforces fall risk teachings . Bed alarm is ON . Bed is in lowest position . Floor mat present . Charge Nurse Vola is aware

## 2024-06-19 NOTE — Plan of Care (Signed)
  Problem: Clinical Measurements: Goal: Ability to maintain clinical measurements within normal limits will improve Outcome: Progressing Goal: Will remain free from infection Outcome: Progressing Goal: Diagnostic test results will improve Outcome: Progressing   Problem: Education: Goal: Knowledge of General Education information will improve Description: Including pain rating scale, medication(s)/side effects and non-pharmacologic comfort measures Outcome: Progressing

## 2024-06-19 NOTE — Progress Notes (Signed)
 Mobility Specialist - Progress Note   06/19/24 1502  Mobility  Activity Ambulated with assistance  Level of Assistance Standby assist, set-up cues, supervision of patient - no hands on  Assistive Device Front wheel walker  Distance Ambulated (ft) 350 ft  Range of Motion/Exercises Active  Activity Response Tolerated well  Mobility Referral Yes  Mobility visit 1 Mobility  Mobility Specialist Start Time (ACUTE ONLY) 1445  Mobility Specialist Stop Time (ACUTE ONLY) 1458  Mobility Specialist Time Calculation (min) (ACUTE ONLY) 13 min   Received in bed an agreed to mobility. Had no issues throughout session, returned to chair with all needs met. NT notified.  Cyndee Ada Mobility Specialist

## 2024-06-20 DIAGNOSIS — N179 Acute kidney failure, unspecified: Secondary | ICD-10-CM | POA: Diagnosis not present

## 2024-06-20 LAB — BASIC METABOLIC PANEL WITH GFR
Anion gap: 8 (ref 5–15)
BUN: 22 mg/dL (ref 8–23)
CO2: 22 mmol/L (ref 22–32)
Calcium: 9.4 mg/dL (ref 8.9–10.3)
Chloride: 111 mmol/L (ref 98–111)
Creatinine, Ser: 1.91 mg/dL — ABNORMAL HIGH (ref 0.44–1.00)
GFR, Estimated: 27 mL/min — ABNORMAL LOW (ref >60)
Glucose, Bld: 87 mg/dL (ref 70–99)
Potassium: 4.7 mmol/L (ref 3.5–5.1)
Sodium: 140 mmol/L (ref 135–145)

## 2024-06-20 LAB — CBC
HCT: 26 % — ABNORMAL LOW (ref 36.0–46.0)
Hemoglobin: 7.3 g/dL — ABNORMAL LOW (ref 12.0–15.0)
MCH: 23.3 pg — ABNORMAL LOW (ref 26.0–34.0)
MCHC: 28.1 g/dL — ABNORMAL LOW (ref 30.0–36.0)
MCV: 83.1 fL (ref 80.0–100.0)
Platelets: 271 K/uL (ref 150–400)
RBC: 3.13 MIL/uL — ABNORMAL LOW (ref 3.87–5.11)
RDW: 17.7 % — ABNORMAL HIGH (ref 11.5–15.5)
WBC: 5.6 K/uL (ref 4.0–10.5)
nRBC: 0 % (ref 0.0–0.2)

## 2024-06-20 MED ORDER — BOOST / RESOURCE BREEZE PO LIQD CUSTOM: 1.0000 | Freq: Three times a day (TID) | ORAL | Status: DC

## 2024-06-20 MED ORDER — PEG 3350-KCL-NA BICARB-NACL 420 G PO SOLR
4000.0000 mL | Freq: Once | ORAL | Status: AC
  Administered 2024-06-20: 4000 mL via ORAL
  Filled 2024-06-20: qty 4000

## 2024-06-20 MED ORDER — SODIUM CHLORIDE 0.9 % IV SOLN: INTRAVENOUS | Status: AC

## 2024-06-20 MED ORDER — IRON SUCROSE 400 MG IVPB - SIMPLE MED
400.0000 mg | Freq: Once | Status: AC
  Administered 2024-06-20: 400 mg via INTRAVENOUS
  Filled 2024-06-20: qty 400

## 2024-06-20 MED ORDER — SODIUM CHLORIDE 0.9 % IV SOLN
1.0000 g | INTRAVENOUS | Status: AC
  Administered 2024-06-20 – 2024-06-21 (×2): 1 g via INTRAVENOUS
  Filled 2024-06-20: qty 10

## 2024-06-20 NOTE — H&P (View-Only) (Signed)
 Reason for Consult: IDA, melena, hiatal hernia Referring Physician: [facility]  Tamara Friedman HPI: This is a 73 year old female with a PMH of IDA, GERD, colonic polyps, hiatal hernia, and hyperlipidemia admitted for complaints of worsening nausea and vomiting.  The patient started to have issues 1-2 weeks ago with nausea and vomiting.  As the week progressed she was not able to tolerate any significant amount of PO intake.  This was associated with lower abdominal pain and back pain.  In the ER she was noted to have a large hiatal hernia with imaging.  The hiatal hernia was mostly in her chest.  The patient had an EGD/colonoscopy on 07/2022 for her history of IDA and the hiatal hernia, at that time, was measured to be 3 cm.  Compared to a prior CT scan on 05/31/2022 the large hiatal hernia was a new finding.  During this hospitalization she was noted to have a worsening of her anemia and she did complain about having some melena.  In the past with iron  supplementation she was able to normalize her HGB into the 14 g/dL range.  The patient did not report having any hematochezia or melena at home  Past Medical History:  Diagnosis Date   High cholesterol    Hypertension     Past Surgical History:  Procedure Laterality Date   PARTIAL HYSTERECTOMY      History reviewed. No pertinent family history.  Social History:  reports that she has never smoked. She has never used smokeless tobacco. She reports that she does not drink alcohol and does not use drugs.  Allergies: No Known Allergies  Medications: Scheduled:  amLODipine   10 mg Oral Daily   aspirin   81 mg Oral Daily   atorvastatin   10 mg Oral Daily   carvedilol   25 mg Oral BID WC   Influenza vac split trivalent PF  0.5 mL Intramuscular Tomorrow-1000   melatonin  10 mg Oral QHS   pantoprazole   40 mg Oral Daily   polyethylene glycol  17 g Oral Daily   Continuous:  cefTRIAXone  (ROCEPHIN )  IV      Results for orders placed or  performed during the hospital encounter of 06/17/24 (from the past 24 hours)  CBC     Status: Abnormal   Collection Time: 06/20/24  5:09 AM  Result Value Ref Range   WBC 5.6 4.0 - 10.5 K/uL   RBC 3.13 (L) 3.87 - 5.11 MIL/uL   Hemoglobin 7.3 (L) 12.0 - 15.0 g/dL   HCT 73.9 (L) 63.9 - 53.9 %   MCV 83.1 80.0 - 100.0 fL   MCH 23.3 (L) 26.0 - 34.0 pg   MCHC 28.1 (L) 30.0 - 36.0 g/dL   RDW 82.2 (H) 88.4 - 84.4 %   Platelets 271 150 - 400 K/uL   nRBC 0.0 0.0 - 0.2 %  Basic metabolic panel with GFR     Status: Abnormal   Collection Time: 06/20/24  5:09 AM  Result Value Ref Range   Sodium 140 135 - 145 mmol/L   Potassium 4.7 3.5 - 5.1 mmol/L   Chloride 111 98 - 111 mmol/L   CO2 22 22 - 32 mmol/L   Glucose, Bld 87 70 - 99 mg/dL   BUN 22 8 - 23 mg/dL   Creatinine, Ser 8.08 (H) 0.44 - 1.00 mg/dL   Calcium  9.4 8.9 - 10.3 mg/dL   GFR, Estimated 27 (L) >60 mL/min   Anion gap 8 5 - 15  No results found.  ROS:  As stated above in the HPI otherwise negative.  Blood pressure (!) 164/77, pulse 70, temperature 98.1 F (36.7 C), temperature source Oral, resp. rate 15, SpO2 97%.    PE: Gen: NAD, Alert and Oriented HEENT:  [place]/AT, EOMI Neck: Supple, no LAD Lungs: CTA Bilaterally CV: RRR without M/G/R ABD: Soft, NTND, +BS Ext: No C/C/E  Assessment/Plan: 1) IDA. 2) Melena. 3) Worsened hiatal hernia.   Review of her HGB over the past couple of years showed that her HGB was in the 8-9 g/dL range.  It is suspected that she developed an acute worsening of her hiatal hernia when she developed the nausea and vomiting.  The nausea and vomiting resolved with the use of antiemetics and she believes she can drink the prep for the colonoscopy.  With the recurrent anemia the plan is to repeat the EGD and colonoscopy.  It is possible she can have Cameron's erosions with the hiatal hernia.  Plan: 1) EGD/colonoscopy tomorrow.  If there is no pathology, then an VCE will be performed. 2) Follow HGB and  transfuse as necessary.  Tamara Friedman D 06/20/2024, 1:05 PM

## 2024-06-20 NOTE — Progress Notes (Addendum)
 PROGRESS NOTE    Tamara Friedman  FMW:[id number] DOB: 1950-10-19 DOA: 06/17/2024 PCP: Benjamine Aland, MD  73/F with large hiatal hernia, hypertension, dyslipidemia presented to the ED with nausea vomiting and lower back pain started 2 to 3 days ago, in the ED she was afebrile, vital stable, creatinine higher than baseline, UA abnormal with positive nitrite and leukocyte esterase, CT abdomen pelvis noted large hiatal hernia, left kidney cystic lesion and diverticulosis. - Admitted, started on antibiotics, hospital course complicated by worsening anemia, severe iron deficiency and dark stools   Subjective: - Reports some dark stools yesterday and the day before, some chronic back pain  Assessment and Plan:  Urinary tract infection - Has flank pain, some bladder discomfort, UA significantly abnormal - Day 2 of ceftriaxone , urine cultures with multiple species, discontinue antibiotics after 3 days  Acute kidney injury - Likely prerenal from poor p.o. intake, nausea and vomiting -Baseline creatinine around 1.5 - Stop IV fluids, kidney function appears to be plateauing  Acute on chronic iron deficiency anemia - History of melena for 1 to 2 days, long history of iron deficiency anemia, followed by Dr. Rollin, last EGD/colonoscopy were in 12/23, noted hiatal hernia and polyps - hb worsening down to 7.3 this morning, report of melena, anemia panel with severe iron deficiency - Discussed with Dr. Rollin, plan for EGD +-colonoscopy tomorrow, - She is a [employer] and refuses blood transfusion, will repeat IV iron today  Large hiatal hernia - Follow-up with general surgery  Essential hypertension Continue amlodipine  and carvedilol   Left kidney exophytic lesion - Per radiologist noted to be a large hyperdense exophytic lesion concerning for benign cyst, no follow-up recommended   DVT prophylaxis: Lovenox  Code Status: Full code Family Communication: None present Disposition Plan: Home in  48 hours  Consultants: Gastroenterology   Procedures:   Antimicrobials:    Objective: Vitals:   06/19/24 1313 06/19/24 1940 06/20/24 0613 06/20/24 0941  BP: 121/66 138/86 (!) 159/87 (!) 164/77  Pulse: 74 75 72 70  Resp: 17 15 15    Temp: 98.6 F (37 C) 98.7 F (37.1 C) 98.1 F (36.7 C)   TempSrc: Oral Oral Oral   SpO2: 99% 97% 97%     Intake/Output Summary (Last 24 hours) at 06/20/2024 0943 Last data filed at 06/20/2024 0827 Gross per 24 hour  Intake 934.85 ml  Output --  Net 934.85 ml   There were no vitals filed for this visit.  Examination:  General exam: Appears calm and comfortable, AAO x 3, no distress Respiratory system: Clear cardiovascular system: S1-S2, regular rhythm Abd: nondistended, soft and nontender.Normal bowel sounds heard.,  Flank tenderness is improving Central nervous system: Alert and oriented. No focal neurological deficits. Extremities: no edema Skin: No rashes Psychiatry:  Mood & affect appropriate.     Data Reviewed:   CBC: Recent Labs  Lab 06/17/24 2311 06/19/24 0439 06/20/24 0509  WBC 6.3 5.7 5.6  NEUTROABS 3.2  --   --   HGB 9.5* 7.5* 7.3*  HCT 33.0* 26.4* 26.0*  MCV 81.3 82.0 83.1  PLT 372 285 271   Basic Metabolic Panel: Recent Labs  Lab 06/17/24 2311 06/19/24 0439 06/20/24 0509  NA 138 139 140  K 5.0 5.1 4.7  CL 106 108 111  CO2 19* 21* 22  GLUCOSE 91 91 87  BUN 28* 26* 22  CREATININE 2.11* 1.92* 1.91*  CALCIUM  9.9 9.0 9.4   GFR: CrCl cannot be calculated (Unknown ideal weight.). Liver Function Tests: Recent  Labs  Lab 06/17/24 2311  AST 24  ALT 12  ALKPHOS 99  BILITOT 0.3  PROT 7.4  ALBUMIN 4.1   Recent Labs  Lab 06/17/24 2311  LIPASE 140*   No results for input(s): AMMONIA in the last 168 hours. Coagulation Profile: No results for input(s): INR, PROTIME in the last 168 hours. Cardiac Enzymes: No results for input(s): CKTOTAL, CKMB, CKMBINDEX, TROPONINI in the last 168  hours. BNP (last 3 results) No results for input(s): PROBNP in the last 8760 hours. HbA1C: No results for input(s): HGBA1C in the last 72 hours. CBG: Recent Labs  Lab 06/17/24 2250 06/17/24 2319  GLUCAP 113* 83   Lipid Profile: No results for input(s): CHOL, HDL, LDLCALC, TRIG, CHOLHDL, LDLDIRECT in the last 72 hours. Thyroid  Function Tests: No results for input(s): TSH, T4TOTAL, FREET4, T3FREE, THYROIDAB in the last 72 hours. Anemia Panel: Recent Labs    06/19/24 0439  VITAMINB12 460  FOLATE 7.6  FERRITIN 8*  TIBC 391  IRON 15*  RETICCTPCT 2.0   Urine analysis:    Component Value Date/Time   COLORURINE YELLOW 06/18/2024 0253   APPEARANCEUR HAZY (A) 06/18/2024 0253   LABSPEC 1.011 06/18/2024 0253   PHURINE 5.0 06/18/2024 0253   GLUCOSEU NEGATIVE 06/18/2024 0253   HGBUR NEGATIVE 06/18/2024 0253   BILIRUBINUR NEGATIVE 06/18/2024 0253   KETONESUR NEGATIVE 06/18/2024 0253   PROTEINUR NEGATIVE 06/18/2024 0253   UROBILINOGEN 0.2 07/19/2013 2044   NITRITE POSITIVE (A) 06/18/2024 0253   LEUKOCYTESUR LARGE (A) 06/18/2024 0253   Sepsis Labs: @LABRCNTIP (procalcitonin:4,lacticidven:4)  ) Recent Results (from the past 240 hours)  Resp panel by RT-PCR (RSV, Flu A&B, Covid) Anterior Nasal Swab     Status: None   Collection Time: 06/17/24 11:11 PM   Specimen: Anterior Nasal Swab  Result Value Ref Range Status   SARS Coronavirus 2 by RT PCR NEGATIVE NEGATIVE Final    Comment: (NOTE) SARS-CoV-2 target nucleic acids are NOT DETECTED.  The SARS-CoV-2 RNA is generally detectable in upper respiratory specimens during the acute phase of infection. The lowest concentration of SARS-CoV-2 viral copies this assay can detect is 138 copies/mL. A negative result does not preclude SARS-Cov-2 infection and should not be used as the sole basis for treatment or other patient management decisions. A negative result may occur with  improper specimen  collection/handling, submission of specimen other than nasopharyngeal swab, presence of viral mutation(s) within the areas targeted by this assay, and inadequate number of viral copies(<138 copies/mL). A negative result must be combined with clinical observations, patient history, and epidemiological information. The expected result is Negative.  Fact Sheet for Patients:  bloggercourse.com  Fact Sheet for Healthcare Providers:  seriousbroker.it  This test is no t yet approved or cleared by the [place]  FDA and  has been authorized for detection and/or diagnosis of SARS-CoV-2 by FDA under an Emergency Use Authorization (EUA). This EUA will remain  in effect (meaning this test can be used) for the duration of the COVID-19 declaration under Section 564(b)(1) of the Act, 21 U.S.C.section 360bbb-3(b)(1), unless the authorization is terminated  or revoked sooner.       Influenza A by PCR NEGATIVE NEGATIVE Final   Influenza B by PCR NEGATIVE NEGATIVE Final    Comment: (NOTE) The Xpert Xpress SARS-CoV-2/FLU/RSV plus assay is intended as an aid in the diagnosis of influenza from Nasopharyngeal swab specimens and should not be used as a sole basis for treatment. Nasal washings and aspirates are unacceptable for Xpert Xpress SARS-CoV-2/FLU/RSV  testing.  Fact Sheet for Patients: bloggercourse.com  Fact Sheet for Healthcare Providers: seriousbroker.it  This test is not yet approved or cleared by the [place]  FDA and has been authorized for detection and/or diagnosis of SARS-CoV-2 by FDA under an Emergency Use Authorization (EUA). This EUA will remain in effect (meaning this test can be used) for the duration of the COVID-19 declaration under Section 564(b)(1) of the Act, 21 U.S.C. section 360bbb-3(b)(1), unless the authorization is terminated or revoked.     Resp Syncytial  Virus by PCR NEGATIVE NEGATIVE Final    Comment: (NOTE) Fact Sheet for Patients: bloggercourse.com  Fact Sheet for Healthcare Providers: seriousbroker.it  This test is not yet approved or cleared by the [place]  FDA and has been authorized for detection and/or diagnosis of SARS-CoV-2 by FDA under an Emergency Use Authorization (EUA). This EUA will remain in effect (meaning this test can be used) for the duration of the COVID-19 declaration under Section 564(b)(1) of the Act, 21 U.S.C. section 360bbb-3(b)(1), unless the authorization is terminated or revoked.  Performed at [facility], [address] [place], [place] [address]   Urine Culture     Status: Abnormal   Collection Time: 06/18/24  2:53 AM   Specimen: Urine, Clean Catch  Result Value Ref Range Status   Specimen Description   Final    URINE, CLEAN CATCH Performed at [facility], [address], [place], [place] [address]    Special Requests   Final    NONE Performed at [facility], [address] [place], [place] [address]    Culture MULTIPLE SPECIES PRESENT, SUGGEST RECOLLECTION (A)  Final   Report Status 06/19/2024 FINAL  Final     Radiology Studies: No results found.    Scheduled Meds:  amLODipine   10 mg Oral Daily   aspirin   81 mg Oral Daily   atorvastatin   10 mg Oral Daily   carvedilol   25 mg Oral BID WC   Influenza vac split trivalent PF  0.5 mL Intramuscular Tomorrow-1000   melatonin  10 mg Oral QHS   pantoprazole   40 mg Oral Daily   polyethylene glycol  17 g Oral Daily   Continuous Infusions:  cefTRIAXone  (ROCEPHIN )  IV 1 g (06/19/24 0958)   iron sucrose       LOS: 0 days    Time spent:    Sigurd Pac, MD [facility]   06/20/2024, 9:43 AM

## 2024-06-20 NOTE — Progress Notes (Signed)
 Mobility Specialist - Progress Note   06/20/24 0833  Mobility  Activity Ambulated with assistance  Level of Assistance Standby assist, set-up cues, supervision of patient - no hands on  Assistive Device Front wheel walker  Distance Ambulated (ft) 350 ft  Range of Motion/Exercises Active  Activity Response Tolerated well  Mobility visit 1 Mobility  Mobility Specialist Start Time (ACUTE ONLY) 0810  Mobility Specialist Stop Time (ACUTE ONLY) [id number]  Mobility Specialist Time Calculation (min) (ACUTE ONLY) 23 min   Pt was found in bed and agreeable to mobilize. Assisted to bathroom prior to hallway ambulation. At EOS returned to bed with all needs met. Call bell in reach.   Erminio Leos,  Mobility Specialist Can be reached via Secure Chat

## 2024-06-20 NOTE — Progress Notes (Signed)
 Chaplain responded to spiritual care consult request for prayer. Grandson and daughter-in-law bedside. Pt Tamara Friedman welcomed me into the room and expressed her satisfaction with the care she's received from her doctor here at [facility]. She feels well cared for and well supported.  She relies strongly on her faith as an adherent of the [employer] Witness tradition, and welcomed prayer which I happily provided, along with compassionate presence and reflective listening.  [name] expressed gratitude for chaplain presence and is aware we remain available as needed.

## 2024-06-20 NOTE — Plan of Care (Signed)

## 2024-06-20 NOTE — Progress Notes (Signed)
 Mobility Specialist Progress Note:   06/20/24 1636  Mobility  Activity Ambulated with assistance  Level of Assistance Contact guard assist, steadying assist  Assistive Device Front wheel walker  Distance Ambulated (ft) 700 ft  Activity Response Tolerated well  Mobility Referral Yes  Mobility visit 1 Mobility  Mobility Specialist Start Time (ACUTE ONLY) 1612  Mobility Specialist Stop Time (ACUTE ONLY) 1625  Mobility Specialist Time Calculation (min) (ACUTE ONLY) 13 min   Pt was received in bed and agreed to mobility. No complaints during session. Returned to bed with all needs met. Left in room with family.  [employer] - Mobility Specialist

## 2024-06-20 NOTE — Consult Note (Signed)
 Reason for Consult: IDA, melena, hiatal hernia Referring Physician: [facility]  Tamara Friedman HPI: This is a 73 year old female with a PMH of IDA, GERD, colonic polyps, hiatal hernia, and hyperlipidemia admitted for complaints of worsening nausea and vomiting.  The patient started to have issues 1-2 weeks ago with nausea and vomiting.  As the week progressed she was not able to tolerate any significant amount of PO intake.  This was associated with lower abdominal pain and back pain.  In the ER she was noted to have a large hiatal hernia with imaging.  The hiatal hernia was mostly in her chest.  The patient had an EGD/colonoscopy on 07/2022 for her history of IDA and the hiatal hernia, at that time, was measured to be 3 cm.  Compared to a prior CT scan on 05/31/2022 the large hiatal hernia was a new finding.  During this hospitalization she was noted to have a worsening of her anemia and she did complain about having some melena.  In the past with iron supplementation she was able to normalize her HGB into the 14 g/dL range.  The patient did not report having any hematochezia or melena at home  Past Medical History:  Diagnosis Date   High cholesterol    Hypertension     Past Surgical History:  Procedure Laterality Date   PARTIAL HYSTERECTOMY      History reviewed. No pertinent family history.  Social History:  reports that she has never smoked. She has never used smokeless tobacco. She reports that she does not drink alcohol and does not use drugs.  Allergies: No Known Allergies  Medications: Scheduled:  amLODipine   10 mg Oral Daily   aspirin   81 mg Oral Daily   atorvastatin   10 mg Oral Daily   carvedilol   25 mg Oral BID WC   Influenza vac split trivalent PF  0.5 mL Intramuscular Tomorrow-1000   melatonin  10 mg Oral QHS   pantoprazole   40 mg Oral Daily   polyethylene glycol  17 g Oral Daily   Continuous:  cefTRIAXone  (ROCEPHIN )  IV      Results for orders placed or  performed during the hospital encounter of 06/17/24 (from the past 24 hours)  CBC     Status: Abnormal   Collection Time: 06/20/24  5:09 AM  Result Value Ref Range   WBC 5.6 4.0 - 10.5 K/uL   RBC 3.13 (L) 3.87 - 5.11 MIL/uL   Hemoglobin 7.3 (L) 12.0 - 15.0 g/dL   HCT 73.9 (L) 63.9 - 53.9 %   MCV 83.1 80.0 - 100.0 fL   MCH 23.3 (L) 26.0 - 34.0 pg   MCHC 28.1 (L) 30.0 - 36.0 g/dL   RDW 82.2 (H) 88.4 - 84.4 %   Platelets 271 150 - 400 K/uL   nRBC 0.0 0.0 - 0.2 %  Basic metabolic panel with GFR     Status: Abnormal   Collection Time: 06/20/24  5:09 AM  Result Value Ref Range   Sodium 140 135 - 145 mmol/L   Potassium 4.7 3.5 - 5.1 mmol/L   Chloride 111 98 - 111 mmol/L   CO2 22 22 - 32 mmol/L   Glucose, Bld 87 70 - 99 mg/dL   BUN 22 8 - 23 mg/dL   Creatinine, Ser 8.08 (H) 0.44 - 1.00 mg/dL   Calcium  9.4 8.9 - 10.3 mg/dL   GFR, Estimated 27 (L) >60 mL/min   Anion gap 8 5 - 15  No results found.  ROS:  As stated above in the HPI otherwise negative.  Blood pressure (!) 164/77, pulse 70, temperature 98.1 F (36.7 C), temperature source Oral, resp. rate 15, SpO2 97%.    PE: Gen: NAD, Alert and Oriented HEENT:  [place]/AT, EOMI Neck: Supple, no LAD Lungs: CTA Bilaterally CV: RRR without M/G/R ABD: Soft, NTND, +BS Ext: No C/C/E  Assessment/Plan: 1) IDA. 2) Melena. 3) Worsened hiatal hernia.   Review of her HGB over the past couple of years showed that her HGB was in the 8-9 g/dL range.  It is suspected that she developed an acute worsening of her hiatal hernia when she developed the nausea and vomiting.  The nausea and vomiting resolved with the use of antiemetics and she believes she can drink the prep for the colonoscopy.  With the recurrent anemia the plan is to repeat the EGD and colonoscopy.  It is possible she can have Cameron's erosions with the hiatal hernia.  Plan: 1) EGD/colonoscopy tomorrow.  If there is no pathology, then an VCE will be performed. 2) Follow HGB and  transfuse as necessary.  Tamara Friedman D 06/20/2024, 1:05 PM

## 2024-06-21 ENCOUNTER — Encounter (HOSPITAL_COMMUNITY)
Admission: EM | Disposition: A | Discharge status: Home / Self Care | Payer: Self-pay | Source: Home / Self Care | Attending: Emergency Medicine

## 2024-06-21 ENCOUNTER — Observation Stay (HOSPITAL_BASED_OUTPATIENT_CLINIC_OR_DEPARTMENT_OTHER): Admitting: Anesthesiology

## 2024-06-21 ENCOUNTER — Observation Stay (HOSPITAL_COMMUNITY): Admitting: Anesthesiology

## 2024-06-21 DIAGNOSIS — I110 Hypertensive heart disease with heart failure: Secondary | ICD-10-CM | POA: Diagnosis not present

## 2024-06-21 DIAGNOSIS — N179 Acute kidney failure, unspecified: Secondary | ICD-10-CM | POA: Diagnosis not present

## 2024-06-21 DIAGNOSIS — K5730 Diverticulosis of large intestine without perforation or abscess without bleeding: Secondary | ICD-10-CM

## 2024-06-21 DIAGNOSIS — K449 Diaphragmatic hernia without obstruction or gangrene: Secondary | ICD-10-CM | POA: Diagnosis not present

## 2024-06-21 DIAGNOSIS — R112 Nausea with vomiting, unspecified: Secondary | ICD-10-CM | POA: Diagnosis not present

## 2024-06-21 DIAGNOSIS — K5520 Angiodysplasia of colon without hemorrhage: Secondary | ICD-10-CM | POA: Diagnosis not present

## 2024-06-21 DIAGNOSIS — D509 Iron deficiency anemia, unspecified: Secondary | ICD-10-CM | POA: Diagnosis not present

## 2024-06-21 DIAGNOSIS — I509 Heart failure, unspecified: Secondary | ICD-10-CM | POA: Diagnosis not present

## 2024-06-21 HISTORY — PX: SMART PILL PROCEDURE: SHX6496

## 2024-06-21 HISTORY — PX: ESOPHAGOGASTRODUODENOSCOPY: SHX5428

## 2024-06-21 HISTORY — PX: COLONOSCOPY: SHX5424

## 2024-06-21 LAB — CBC
HCT: 27.8 % — ABNORMAL LOW (ref 36.0–46.0)
Hemoglobin: 7.8 g/dL — ABNORMAL LOW (ref 12.0–15.0)
MCH: 23.4 pg — ABNORMAL LOW (ref 26.0–34.0)
MCHC: 28.1 g/dL — ABNORMAL LOW (ref 30.0–36.0)
MCV: 83.2 fL (ref 80.0–100.0)
Platelets: 291 K/uL (ref 150–400)
RBC: 3.34 MIL/uL — ABNORMAL LOW (ref 3.87–5.11)
RDW: 17.8 % — ABNORMAL HIGH (ref 11.5–15.5)
WBC: 6.9 K/uL (ref 4.0–10.5)
nRBC: 0 % (ref 0.0–0.2)

## 2024-06-21 LAB — BASIC METABOLIC PANEL WITH GFR
Anion gap: 11 (ref 5–15)
BUN: 17 mg/dL (ref 8–23)
CO2: 21 mmol/L — ABNORMAL LOW (ref 22–32)
Calcium: 9.7 mg/dL (ref 8.9–10.3)
Chloride: 108 mmol/L (ref 98–111)
Creatinine, Ser: 1.65 mg/dL — ABNORMAL HIGH (ref 0.44–1.00)
GFR, Estimated: 32 mL/min — ABNORMAL LOW (ref >60)
Glucose, Bld: 73 mg/dL (ref 70–99)
Potassium: 4.2 mmol/L (ref 3.5–5.1)
Sodium: 139 mmol/L (ref 135–145)

## 2024-06-21 SURGERY — COLONOSCOPY
Anesthesia: Monitor Anesthesia Care

## 2024-06-21 MED ORDER — LIDOCAINE 2% (20 MG/ML) 5 ML SYRINGE
INTRAMUSCULAR | Status: DC | PRN
  Administered 2024-06-21: 80 mg via INTRAVENOUS

## 2024-06-21 MED ORDER — HYDRALAZINE HCL 20 MG/ML IJ SOLN: 10.0000 mg | Freq: Four times a day (QID) | INTRAMUSCULAR | Status: DC | PRN

## 2024-06-21 MED ORDER — PROPOFOL 500 MG/50ML IV EMUL
INTRAVENOUS | Status: DC | PRN
  Administered 2024-06-21 (×2): 30 mg via INTRAVENOUS
  Administered 2024-06-21: 150 ug/kg/min via INTRAVENOUS

## 2024-06-21 NOTE — Anesthesia Postprocedure Evaluation (Signed)
 Anesthesia Post Note  Patient: Tamara Friedman  Procedure(s) Performed: COLONOSCOPY EGD (ESOPHAGOGASTRODUODENOSCOPY) CAPSULE ENDOSCOPY, USING SMARTPILL MOTILITY TESTING SYSTEM     Patient location during evaluation: PACU Anesthesia Type: MAC Level of consciousness: awake and alert Pain management: pain level controlled Vital Signs Assessment: post-procedure vital signs reviewed and stable Respiratory status: spontaneous breathing, nonlabored ventilation and respiratory function stable Cardiovascular status: blood pressure returned to baseline and stable Postop Assessment: no apparent nausea or vomiting Anesthetic complications: no   No notable events documented.  Last Vitals:  Vitals:   06/21/24 1350 06/21/24 1400  BP: (!) 183/89 (!) 187/88  Pulse: 64 65  Resp: (!) 23 (!) 21  Temp:    SpO2: 95% 96%    Last Pain:  Vitals:   06/21/24 1400  TempSrc:   PainSc: 0-No pain                 Butler Levander Pinal

## 2024-06-21 NOTE — Discharge Instructions (Signed)
      [facility] ENDOSCOPY [address] [place], [place]  [address] Phone:  [phone]   June 21, 2024  Patient: Tamara Friedman  Date of Birth: [birth year]  Date of Visit: June 21, 2024    To Whom It May Concern:  Poetry Cerro was seen and treated on June 21, 2024 she was in [facility] on November 20,2025 for her procedure on November 21 with Dr. Rollin. Arlita Buffkin was at the hospital with his grandmother on these dates.     .           If you have any questions or concerns, please don't hesitate to call [phone]   Sincerely,       Treatment Team:  Attending Provider: Drew Ellouise SQUIBB, MD

## 2024-06-21 NOTE — Progress Notes (Signed)
 [facility] capsule endoscopy ordered by MD Dr Rollin.  Patient ingested capsule at 13:20.  Per Given's capsule instructions, patient to remain NPO until 15:20 at which time they may progress to clear liquid diet. At 17:20 patient may have a small snack such as a half a sandwich or a bowl of soup. At 21:20 patient may progress to previously ordered diet.  The capsule endoscopy study will conclude at 01:20 at which time the recorder and leads or belt can be removed and placed in a patient belongings bag. Endoscopy staff will pick up the equipment in the AM.  Instructions provided to patient and inpatient RN. Patient and RN demonstrated understanding.

## 2024-06-21 NOTE — Transfer of Care (Signed)
 Immediate Anesthesia Transfer of Care Note  Patient: Tamara Friedman  Procedure(s) Performed: COLONOSCOPY EGD (ESOPHAGOGASTRODUODENOSCOPY) CAPSULE ENDOSCOPY, USING SMARTPILL MOTILITY TESTING SYSTEM  Patient Location: PACU  Anesthesia Type:MAC  Level of Consciousness: awake, alert , and oriented  Airway & Oxygen Therapy: Patient Spontanous Breathing and Patient connected to face mask oxygen  Post-op Assessment: Report given to RN and Post -op Vital signs reviewed and stable  Post vital signs: Reviewed and stable  Last Vitals:  Vitals Value Taken Time  BP    Temp    Pulse    Resp    SpO2      Last Pain:  Vitals:   06/21/24 1226  TempSrc: Temporal  PainSc: 0-No pain      Patients Stated Pain Goal: 0 (06/21/24 1226)  Complications: No notable events documented.

## 2024-06-21 NOTE — Plan of Care (Signed)

## 2024-06-21 NOTE — Op Note (Signed)
 [facility] Patient Name: Tamara Friedman Procedure Date: 06/21/2024 MRN: [id number] Attending MD: Belvie Just , MD, [id number] Date of Birth: 02-16-1951 CSN: [id number] Age: 73 Admit Type: Inpatient Procedure:                Upper GI endoscopy Indications:              Iron  deficiency anemia Providers:                Belvie Just, MD, Jacquelyn Jaci Pierce, RN,                            Haskel Chris, Technician Referring MD:              Medicines:                Propofol  per Anesthesia Complications:            No immediate complications. Estimated Blood Loss:     Estimated blood loss: none. Procedure:                Pre-Anesthesia Assessment:                           - Prior to the procedure, a History and Physical                            was performed, and patient medications and                            allergies were reviewed. The patient's tolerance of                            previous anesthesia was also reviewed. The risks                            and benefits of the procedure and the sedation                            options and risks were discussed with the patient.                            All questions were answered, and informed consent                            was obtained. Prior Anticoagulants: The patient has                            taken no anticoagulant or antiplatelet agents. ASA                            Grade Assessment: III - A patient with severe                            systemic disease. After reviewing the risks and  benefits, the patient was deemed in satisfactory                            condition to undergo the procedure.                           - Sedation was administered by an anesthesia                            professional. Deep sedation was attained.                           After obtaining informed consent, the endoscope was                            passed under direct  vision. Throughout the                            procedure, the patient's blood pressure, pulse, and                            oxygen saturations were monitored continuously. The                            [id number]-[id number] ([id number]) Olympus endoscope was introduced                            through the mouth, and advanced to the second part                            of duodenum. The upper GI endoscopy was                            accomplished without difficulty. The patient                            tolerated the procedure well. Scope In: Scope Out: Findings:      A 10 cm hiatal hernia was present.      Multiple dispersed very superficial linear erythema with no stigmata of       recent bleeding were found in the gastric body. These were consistent       with Cameron's erosions, but no overt erosions were noted. There was no       evidence of any bleeding.      The examined duodenum was normal. Impression:               - 10 cm hiatal hernia.                           - Cameron's lesions gastropathy with no stigmata of                            recent bleeding.                           -  Normal examined duodenum.                           - No specimens collected. Moderate Sedation:      Not Applicable - Patient had care per Anesthesia. Recommendation:           - Follow up VCE.                           - Proceed with the colonoscopy. Procedure Code(s):        --- Professional ---                           [phone], Esophagogastroduodenoscopy, flexible,                            transoral; diagnostic, including collection of                            specimen(s) by brushing or washing, when performed                            (separate procedure) Diagnosis Code(s):        --- Professional ---                           D50.9, Iron  deficiency anemia, unspecified                           K44.9, Diaphragmatic hernia without obstruction or                            gangrene                            K31.89, Other diseases of stomach and duodenum CPT copyright 2022 American [employer]. All rights reserved. The codes documented in this report are preliminary and upon coder review may  be revised to meet current compliance requirements. Belvie Just, MD Belvie Just, MD 06/21/2024 1:30:35 PM This report has been signed electronically. Number of Addenda: 0

## 2024-06-21 NOTE — Op Note (Signed)
 [facility] Patient Name: Tamara Friedman Procedure Date: 06/21/2024 MRN: [id number] Attending MD: Belvie Just , MD, [id number] Date of Birth: [birth year] CSN: [id number] Age: 73 Admit Type: Inpatient Procedure:                Colonoscopy Indications:              Iron  deficiency anemia Providers:                Belvie Just, MD, Jacquelyn Jaci Pierce, RN,                            Haskel Chris, Technician Referring MD:              Medicines:                Propofol  per Anesthesia Complications:            No immediate complications. Estimated Blood Loss:     Estimated blood loss: none. Procedure:                Pre-Anesthesia Assessment:                           - Prior to the procedure, a History and Physical                            was performed, and patient medications and                            allergies were reviewed. The patient's tolerance of                            previous anesthesia was also reviewed. The risks                            and benefits of the procedure and the sedation                            options and risks were discussed with the patient.                            All questions were answered, and informed consent                            was obtained. Prior Anticoagulants: The patient has                            taken no anticoagulant or antiplatelet agents. ASA                            Grade Assessment: III - A patient with severe                            systemic disease. After reviewing the risks and  benefits, the patient was deemed in satisfactory                            condition to undergo the procedure.                           - Sedation was administered by an anesthesia                            professional. Deep sedation was attained.                           After obtaining informed consent, the colonoscope                            was passed under direct vision.  Throughout the                            procedure, the patient's blood pressure, pulse, and                            oxygen saturations were monitored continuously. The                            CF-HQ190L ([id number]) Olympus colonoscope was                            introduced through the anus and advanced to the the                            cecum, identified by appendiceal orifice and                            ileocecal valve. The colonoscopy was performed                            without difficulty. The patient tolerated the                            procedure well. The quality of the bowel                            preparation was evaluated using the BBPS [facility]                            Bowel Preparation Scale) with scores of: Right                            Colon = 2 (minor amount of residual staining, small                            fragments of stool and/or opaque liquid, but mucosa  seen well), Transverse Colon = 2 (minor amount of                            residual staining, small fragments of stool and/or                            opaque liquid, but mucosa seen well) and Left Colon                            = 2 (minor amount of residual staining, small                            fragments of stool and/or opaque liquid, but mucosa                            seen well). The total BBPS score equals 6. The                            quality of the bowel preparation was good. The                            ileocecal valve, appendiceal orifice, and rectum                            were photographed. Scope In: 12:58:12 PM Scope Out: 1:09:19 PM Scope Withdrawal Time: 0 hours 6 minutes 29 seconds  Total Procedure Duration: 0 hours 11 minutes 7 seconds  Findings:      Scattered large-mouthed, medium-mouthed and small-mouthed diverticula       were found in the sigmoid colon. Impression:               - Diverticulosis in the sigmoid colon.                            - No specimens collected. Moderate Sedation:      Not Applicable - Patient had care per Anesthesia. Recommendation:           - Return patient to hospital ward for ongoing care.                           - Resume regular diet.                           - Continue present medications. Procedure Code(s):        --- Professional ---                           [phone], Colonoscopy, flexible; diagnostic, including                            collection of specimen(s) by brushing or washing,                            when performed (separate procedure) Diagnosis Code(s):        --- Professional ---  D50.9, Iron  deficiency anemia, unspecified                           K57.30, Diverticulosis of large intestine without                            perforation or abscess without bleeding CPT copyright 2022 American Medical Association. All rights reserved. The codes documented in this report are preliminary and upon coder review may  be revised to meet current compliance requirements. Belvie Just, MD Belvie Just, MD 06/21/2024 1:26:03 PM This report has been signed electronically. Number of Addenda: 0

## 2024-06-21 NOTE — Progress Notes (Signed)
 PROGRESS NOTE    Tamara Friedman  FMW:[id number] DOB: May 05, 1951 DOA: 06/17/2024 PCP: Benjamine Aland, MD  73/F with large hiatal hernia, hypertension, dyslipidemia presented to the ED with nausea vomiting and lower back pain started 2 to 3 days ago, in the ED she was afebrile, vital stable, creatinine higher than baseline, UA abnormal with positive nitrite and leukocyte esterase, CT abdomen pelvis noted large hiatal hernia, left kidney cystic lesion and diverticulosis. - Admitted, started on antibiotics, hospital course complicated by worsening anemia, severe iron  deficiency and dark stools   Subjective: Patient seen and examined bedside today.  Patient states she is awaiting to get the EGD done.  Assessment and Plan:  Urinary tract infection - Has flank pain, some bladder discomfort, UA significantly abnormal - Day 2 of ceftriaxone , urine cultures with multiple species, discontinue antibiotics after 3 days  Acute kidney injury - Likely prerenal from poor p.o. intake, nausea and vomiting -Baseline creatinine around 1.5 - Stop IV fluids, kidney function appears to be plateauing - Creatinine has improved from 1.9-1.65  Acute on chronic iron  deficiency anemia - History of melena for 1 to 2 days, long history of iron  deficiency anemia, followed by Dr. Rollin, last EGD/colonoscopy were in 12/23, noted hiatal hernia and polyps - hb worsening down to 7.3 this morning, report of melena, anemia panel with severe iron  deficiency - Discussed with Dr. Rollin, plan for EGD +-colonoscopy today - She is a [employer] and refuses blood transfusion, will repeat IV iron  today - Repeat hemoglobin today is 7.8.  Large hiatal hernia - Follow-up with general surgery  Essential hypertension Continue amlodipine  and carvedilol   Left kidney exophytic lesion - Per radiologist noted to be a large hyperdense exophytic lesion concerning for benign cyst, no follow-up recommended   DVT prophylaxis:  Lovenox  Code Status: Full code Family Communication: None present Disposition Plan: Home in 48 hours  Consultants: Gastroenterology   Procedures:   Antimicrobials:    Objective: Vitals:   06/20/24 2214 06/21/24 0545 06/21/24 1205 06/21/24 1226  BP: 139/89 (!) 148/73 (!) 189/82 (!) 205/98  Pulse: 70 73 69   Resp: 16 17 20  (!) 21  Temp: 98 F (36.7 C) 98.2 F (36.8 C) 98.3 F (36.8 C) 97.8 F (36.6 C)  TempSrc: Oral Oral  Temporal  SpO2: 100% 99% 100% 96%  Weight:    72.6 kg  Height:    5' 6 (1.676 m)    Intake/Output Summary (Last 24 hours) at 06/21/2024 1322 Last data filed at 06/21/2024 9040 Gross per 24 hour  Intake 0 ml  Output 0 ml  Net 0 ml   Filed Weights   06/21/24 1226  Weight: 72.6 kg    Examination:  General exam: Appears calm and comfortable, AAO x 3, no distress Respiratory system: Clear cardiovascular system: S1-S2, regular rhythm Abd: nondistended, soft and nontender.Normal bowel sounds heard.,  Flank tenderness is improving Central nervous system: Alert and oriented. No focal neurological deficits. Extremities: no edema Skin: No rashes Psychiatry:  Mood & affect appropriate.     Data Reviewed:   CBC: Recent Labs  Lab 06/17/24 2311 06/19/24 0439 06/20/24 0509 06/21/24 0427  WBC 6.3 5.7 5.6 6.9  NEUTROABS 3.2  --   --   --   HGB 9.5* 7.5* 7.3* 7.8*  HCT 33.0* 26.4* 26.0* 27.8*  MCV 81.3 82.0 83.1 83.2  PLT 372 285 271 291   Basic Metabolic Panel: Recent Labs  Lab 06/17/24 2311 06/19/24 0439 06/20/24 0509 06/21/24 0427  NA 138 139 140 139  K 5.0 5.1 4.7 4.2  CL 106 108 111 108  CO2 19* 21* 22 21*  GLUCOSE 91 91 87 73  BUN 28* 26* 22 17  CREATININE 2.11* 1.92* 1.91* 1.65*  CALCIUM  9.9 9.0 9.4 9.7   GFR: Estimated Creatinine Clearance: 30 mL/min (A) (by C-G formula based on SCr of 1.65 mg/dL (H)). Liver Function Tests: Recent Labs  Lab 06/17/24 2311  AST 24  ALT 12  ALKPHOS 99  BILITOT 0.3  PROT 7.4  ALBUMIN  4.1   Recent Labs  Lab 06/17/24 2311  LIPASE 140*   No results for input(s): AMMONIA in the last 168 hours. Coagulation Profile: No results for input(s): INR, PROTIME in the last 168 hours. Cardiac Enzymes: No results for input(s): CKTOTAL, CKMB, CKMBINDEX, TROPONINI in the last 168 hours. BNP (last 3 results) No results for input(s): PROBNP in the last 8760 hours. HbA1C: No results for input(s): HGBA1C in the last 72 hours. CBG: Recent Labs  Lab 06/17/24 2250 06/17/24 2319  GLUCAP 113* 83   Lipid Profile: No results for input(s): CHOL, HDL, LDLCALC, TRIG, CHOLHDL, LDLDIRECT in the last 72 hours. Thyroid  Function Tests: No results for input(s): TSH, T4TOTAL, FREET4, T3FREE, THYROIDAB in the last 72 hours. Anemia Panel: Recent Labs    06/19/24 0439  VITAMINB12 460  FOLATE 7.6  FERRITIN 8*  TIBC 391  IRON  15*  RETICCTPCT 2.0   Urine analysis:    Component Value Date/Time   COLORURINE YELLOW 06/18/2024 0253   APPEARANCEUR HAZY (A) 06/18/2024 0253   LABSPEC 1.011 06/18/2024 0253   PHURINE 5.0 06/18/2024 0253   GLUCOSEU NEGATIVE 06/18/2024 0253   HGBUR NEGATIVE 06/18/2024 0253   BILIRUBINUR NEGATIVE 06/18/2024 0253   KETONESUR NEGATIVE 06/18/2024 0253   PROTEINUR NEGATIVE 06/18/2024 0253   UROBILINOGEN 0.2 07/19/2013 2044   NITRITE POSITIVE (A) 06/18/2024 0253   LEUKOCYTESUR LARGE (A) 06/18/2024 0253   Sepsis Labs: @LABRCNTIP (procalcitonin:4,lacticidven:4)  ) Recent Results (from the past 240 hours)  Resp panel by RT-PCR (RSV, Flu A&B, Covid) Anterior Nasal Swab     Status: None   Collection Time: 06/17/24 11:11 PM   Specimen: Anterior Nasal Swab  Result Value Ref Range Status   SARS Coronavirus 2 by RT PCR NEGATIVE NEGATIVE Final    Comment: (NOTE) SARS-CoV-2 target nucleic acids are NOT DETECTED.  The SARS-CoV-2 RNA is generally detectable in upper respiratory specimens during the acute phase of infection. The  lowest concentration of SARS-CoV-2 viral copies this assay can detect is 138 copies/mL. A negative result does not preclude SARS-Cov-2 infection and should not be used as the sole basis for treatment or other patient management decisions. A negative result may occur with  improper specimen collection/handling, submission of specimen other than nasopharyngeal swab, presence of viral mutation(s) within the areas targeted by this assay, and inadequate number of viral copies(<138 copies/mL). A negative result must be combined with clinical observations, patient history, and epidemiological information. The expected result is Negative.  Fact Sheet for Patients:  bloggercourse.com  Fact Sheet for Healthcare Providers:  seriousbroker.it  This test is no t yet approved or cleared by the [place]  FDA and  has been authorized for detection and/or diagnosis of SARS-CoV-2 by FDA under an Emergency Use Authorization (EUA). This EUA will remain  in effect (meaning this test can be used) for the duration of the COVID-19 declaration under Section 564(b)(1) of the Act, 21 U.S.C.section 360bbb-3(b)(1), unless the authorization is terminated  or  revoked sooner.       Influenza A by PCR NEGATIVE NEGATIVE Final   Influenza B by PCR NEGATIVE NEGATIVE Final    Comment: (NOTE) The Xpert Xpress SARS-CoV-2/FLU/RSV plus assay is intended as an aid in the diagnosis of influenza from Nasopharyngeal swab specimens and should not be used as a sole basis for treatment. Nasal washings and aspirates are unacceptable for Xpert Xpress SARS-CoV-2/FLU/RSV testing.  Fact Sheet for Patients: bloggercourse.com  Fact Sheet for Healthcare Providers: seriousbroker.it  This test is not yet approved or cleared by the [place]  FDA and has been authorized for detection and/or diagnosis of SARS-CoV-2 by FDA under  an Emergency Use Authorization (EUA). This EUA will remain in effect (meaning this test can be used) for the duration of the COVID-19 declaration under Section 564(b)(1) of the Act, 21 U.S.C. section 360bbb-3(b)(1), unless the authorization is terminated or revoked.     Resp Syncytial Virus by PCR NEGATIVE NEGATIVE Final    Comment: (NOTE) Fact Sheet for Patients: bloggercourse.com  Fact Sheet for Healthcare Providers: seriousbroker.it  This test is not yet approved or cleared by the [place]  FDA and has been authorized for detection and/or diagnosis of SARS-CoV-2 by FDA under an Emergency Use Authorization (EUA). This EUA will remain in effect (meaning this test can be used) for the duration of the COVID-19 declaration under Section 564(b)(1) of the Act, 21 U.S.C. section 360bbb-3(b)(1), unless the authorization is terminated or revoked.  Performed at [facility], [address] [place], [place] [address]   Urine Culture     Status: Abnormal   Collection Time: 06/18/24  2:53 AM   Specimen: Urine, Clean Catch  Result Value Ref Range Status   Specimen Description   Final    URINE, CLEAN CATCH Performed at [facility], [address] [place], [place] [address]    Special Requests   Final    NONE Performed at [facility], [address] [place], [place] [address]    Culture MULTIPLE SPECIES PRESENT, SUGGEST RECOLLECTION (A)  Final   Report Status 06/19/2024 FINAL  Final     Radiology Studies: No results found.    Scheduled Meds:  [MAR Hold] amLODipine   10 mg Oral Daily   [MAR Hold] aspirin   81 mg Oral Daily   [MAR Hold] atorvastatin   10 mg Oral Daily   [MAR Hold] carvedilol   25 mg Oral BID WC   [MAR Hold] feeding supplement  1 Container Oral TID BM   Influenza vac split trivalent PF  0.5 mL Intramuscular Tomorrow-1000   [MAR Hold] melatonin  10 mg  Oral QHS   [MAR Hold] pantoprazole   40 mg Oral Daily   [MAR Hold] polyethylene glycol  17 g Oral Daily   Continuous Infusions:  sodium chloride  Stopped (06/20/24 1800)   cefTRIAXone  (ROCEPHIN )  IV 1 g (06/20/24 1328)     LOS: 0 days    Time spent:    Sigurd Pac, MD [facility]   06/21/2024, 1:22 PM

## 2024-06-21 NOTE — Anesthesia Preprocedure Evaluation (Addendum)
 Anesthesia Evaluation  Patient identified by MRN, date of birth, ID band Patient awake    Reviewed: Allergy & Precautions, H&P , NPO status , Patient's Chart, lab work & pertinent test results  Airway Mallampati: II  TM Distance: >3 FB Neck ROM: Full    Dental  (+) Dental Advisory Given   Pulmonary neg pulmonary ROS   Pulmonary exam normal breath sounds clear to auscultation       Cardiovascular hypertension, Pt. on medications negative cardio ROS Normal cardiovascular exam Rhythm:Regular Rate:Normal     Neuro/Psych negative neurological ROS  negative psych ROS   GI/Hepatic negative GI ROS, Neg liver ROS,,,  Endo/Other  negative endocrine ROS    Renal/GU negative Renal ROS  negative genitourinary   Musculoskeletal negative musculoskeletal ROS (+)    Abdominal   Peds negative pediatric ROS (+)  Hematology  (+) Blood dyscrasia, anemia   Anesthesia Other Findings   Reproductive/Obstetrics negative OB ROS                              Anesthesia Physical Anesthesia Plan  ASA: 3  Anesthesia Plan: MAC   Post-op Pain Management:    Induction: Intravenous  PONV Risk Score and Plan: 2 and Propofol  infusion and Treatment may vary due to age or medical condition  Airway Management Planned: Nasal Cannula  Additional Equipment:   Intra-op Plan:   Post-operative Plan:   Informed Consent: I have reviewed the patients History and Physical, chart, labs and discussed the procedure including the risks, benefits and alternatives for the proposed anesthesia with the patient or authorized representative who has indicated his/her understanding and acceptance.     Dental advisory given  Plan Discussed with: CRNA  Anesthesia Plan Comments:         Anesthesia Quick Evaluation

## 2024-06-21 NOTE — Progress Notes (Addendum)
 Pt NPO, off unit for colonoscopy with staff.

## 2024-06-21 NOTE — Interval H&P Note (Signed)
 History and Physical Interval Note:  06/21/2024 12:32 PM  Tamara Friedman  has presented today for surgery, with the diagnosis of IDA.  The various methods of treatment have been discussed with the patient and family. After consideration of risks, benefits and other options for treatment, the patient has consented to  Procedure(s): COLONOSCOPY (N/A) EGD (ESOPHAGOGASTRODUODENOSCOPY) (N/A) as a surgical intervention.  The patient's history has been reviewed, patient examined, no change in status, stable for surgery.  I have reviewed the patient's chart and labs.  Questions were answered to the patient's satisfaction.     [name]

## 2024-06-22 DIAGNOSIS — D649 Anemia, unspecified: Secondary | ICD-10-CM

## 2024-06-22 DIAGNOSIS — R112 Nausea with vomiting, unspecified: Secondary | ICD-10-CM

## 2024-06-22 DIAGNOSIS — D509 Iron deficiency anemia, unspecified: Secondary | ICD-10-CM | POA: Diagnosis not present

## 2024-06-22 DIAGNOSIS — K449 Diaphragmatic hernia without obstruction or gangrene: Secondary | ICD-10-CM

## 2024-06-22 DIAGNOSIS — N179 Acute kidney failure, unspecified: Secondary | ICD-10-CM | POA: Diagnosis not present

## 2024-06-22 LAB — CBC WITH DIFFERENTIAL/PLATELET
Abs Immature Granulocytes: 0.03 K/uL (ref 0.00–0.07)
Basophils Absolute: 0 K/uL (ref 0.0–0.1)
Basophils Relative: 1 %
Eosinophils Absolute: 0.3 K/uL (ref 0.0–0.5)
Eosinophils Relative: 6 %
HCT: 26.3 % — ABNORMAL LOW (ref 36.0–46.0)
Hemoglobin: 7.3 g/dL — ABNORMAL LOW (ref 12.0–15.0)
Immature Granulocytes: 1 %
Lymphocytes Relative: 22 %
Lymphs Abs: 1.3 K/uL (ref 0.7–4.0)
MCH: 23.4 pg — ABNORMAL LOW (ref 26.0–34.0)
MCHC: 27.8 g/dL — ABNORMAL LOW (ref 30.0–36.0)
MCV: 84.3 fL (ref 80.0–100.0)
Monocytes Absolute: 0.8 K/uL (ref 0.1–1.0)
Monocytes Relative: 13 %
Neutro Abs: 3.5 K/uL (ref 1.7–7.7)
Neutrophils Relative %: 57 %
Platelets: 285 K/uL (ref 150–400)
RBC: 3.12 MIL/uL — ABNORMAL LOW (ref 3.87–5.11)
RDW: 18.4 % — ABNORMAL HIGH (ref 11.5–15.5)
WBC: 6 K/uL (ref 4.0–10.5)
nRBC: 0.3 % — ABNORMAL HIGH (ref 0.0–0.2)

## 2024-06-22 LAB — COMPREHENSIVE METABOLIC PANEL WITH GFR
ALT: 13 U/L (ref 0–44)
AST: 21 U/L (ref 15–41)
Albumin: 3.2 g/dL — ABNORMAL LOW (ref 3.5–5.0)
Alkaline Phosphatase: 85 U/L (ref 38–126)
Anion gap: 9 (ref 5–15)
BUN: 19 mg/dL (ref 8–23)
CO2: 21 mmol/L — ABNORMAL LOW (ref 22–32)
Calcium: 9.2 mg/dL (ref 8.9–10.3)
Chloride: 106 mmol/L (ref 98–111)
Creatinine, Ser: 1.89 mg/dL — ABNORMAL HIGH (ref 0.44–1.00)
GFR, Estimated: 27 mL/min — ABNORMAL LOW (ref >60)
Glucose, Bld: 135 mg/dL — ABNORMAL HIGH (ref 70–99)
Potassium: 4.5 mmol/L (ref 3.5–5.1)
Sodium: 136 mmol/L (ref 135–145)
Total Bilirubin: <0.2 mg/dL (ref 0.0–1.2)
Total Protein: 5.9 g/dL — ABNORMAL LOW (ref 6.5–8.1)

## 2024-06-22 NOTE — Plan of Care (Signed)
   Problem: Education: Goal: Knowledge of General Education information will improve Description: Including pain rating scale, medication(s)/side effects and non-pharmacologic comfort measures Outcome: Progressing   Problem: Pain Managment: Goal: General experience of comfort will improve and/or be controlled Outcome: Progressing   Problem: Safety: Goal: Ability to remain free from injury will improve Outcome: Progressing

## 2024-06-22 NOTE — Progress Notes (Signed)
 Mobility Specialist Progress Note:   06/22/24 1451  Mobility  Activity Ambulated with assistance  Level of Assistance Contact guard assist, steadying assist  Assistive Device Front wheel walker  Distance Ambulated (ft) 350 ft  Activity Response Tolerated well  Mobility Referral Yes  Mobility visit 1 Mobility  Mobility Specialist Start Time (ACUTE ONLY) 1410  Mobility Specialist Stop Time (ACUTE ONLY) 1422  Mobility Specialist Time Calculation (min) (ACUTE ONLY) 12 min   Pt was received in bed and agreed to mobility. Slightly short of breath towards the end of session, but quickly recovered. Returned to bed with all needs met. Call bell in reach.  [employer] - Mobility Specialist

## 2024-06-22 NOTE — Progress Notes (Signed)
 [facility] ROUNDING NOTE   Subjective: Patient feeling better today.  Tolerating regular diet, but eating small amounts.  Hgb stable.  No BM today.  No vomiting today.  Having some left-sided flank pain.   Objective: Vital signs in last 24 hours: Temp:  [98.2 F (36.8 C)-98.7 F (37.1 C)] 98.4 F (36.9 C) (11/22 1444) Pulse Rate:  [67-85] 74 (11/22 1444) Resp:  [18-20] 18 (11/22 1444) BP: (135-161)/(64-82) 137/82 (11/22 1444) SpO2:  [98 %-100 %] 100 % (11/22 1444) Last BM Date : 06/22/24 General: NAD Lungs:  CTA b/l, no w/r/r Heart:  RRR, no m/r/g Abdomen:  Soft, ND, +BS; tenderness to palpation along left flank/thorax Ext:  No c/c/e    Intake/Output from previous day: 11/21 0701 - 11/22 0700 In: 300 [I.V.:100; IV Piggyback:200] Out: 0  Intake/Output this shift: Total I/O In: 720 [P.O.:720] Out: -    Lab Results: Recent Labs    06/20/24 0509 06/21/24 0427 06/22/24 0450  WBC 5.6 6.9 6.0  HGB 7.3* 7.8* 7.3*  PLT 271 291 285  MCV 83.1 83.2 84.3   BMET Recent Labs    06/20/24 0509 06/21/24 0427 06/22/24 0450  NA 140 139 136  K 4.7 4.2 4.5  CL 111 108 106  CO2 22 21* 21*  GLUCOSE 87 73 135*  BUN 22 17 19   CREATININE 1.91* 1.65* 1.89*  CALCIUM  9.4 9.7 9.2   LFT Recent Labs    06/22/24 0450  PROT 5.9*  ALBUMIN 3.2*  AST 21  ALT 13  ALKPHOS 85  BILITOT <0.2   PT/INR No results for input(s): INR in the last 72 hours.    Imaging/Other results: No results found.    Assessment and Plan:  73 year old female with CKD admitted with persistent nausea/vomiting, iron  deficiency anemia and AKI.  EGD and colonoscopy notable for large hiatal hernia (10 cm compared to 3 cm a few years early), but otherwise normal.  Video capsule deployed endoscopically yesterday, but due to technical issues, was not able to be downloaded/viewed today.  Iron  deficiency anemia - Unclear etiology, but Cameron's lesions most likely source - Will read VCE  tomorrow morning  Nausea/vomiting - Most likely cause is interval change in hiatal hernia - Consider surgical referral for repair of hiatal hernia - Continue zofran  PRN - Continue Protonix  40 mg PO daily  Acute on chronic kidney disease - Improving with hydration, improvement in n/v  If no active bleeding or high risk lesions found on VCE tomorrow, patient may be able to be discharged tomorrow.   Glendia FORBES Holt, MD  06/22/2024, 4:17 PM [facility]

## 2024-06-22 NOTE — Progress Notes (Addendum)
 [facility] Progress Note  Patient: Tamara Friedman     FMW:[id number]  DOA: 06/17/2024   PCP: Benjamine Aland, MD       Brief hospital course: 73 year old female with large hiatal hernia and dyslipidemia presented to the hospital for nausea and vomiting for about 2 to 3 days. In the ED: CT of the abdomen pelvis revealed a large hiatal hernia left cystic renal lesion and diverticulosis UA revealed a large amount of bacteria and WBCs, positive nitrite and leukocyte esterase During her hospital stay she complained of dark stools and hemoglobin was noted to decrease.  A GI consult was requested for further workup.  Subjective:  Having nausea with solid food.  Assessment and Plan: Principal Problem:   AKI (acute kidney injury) CKD stage IIIb - Creatinine 2.11 on admission with baseline about 1.5 - Felt to be secondary to nausea and vomiting - Steadily improving  Nausea and vomiting - Likely secondary to large hiatal hernia-still nauseated with solid food - Continue to monitor  Dark stools with severe iron  deficiency anemia - Has a history of IDA and colon polyps - Hemoglobin 9.5 on admission dropped to 7.5 the following day - GI was consulted and the patient underwent an EGD and colonoscopy on 11/21 - These did not reveal any acute bleeding but she was found to have a 10 cm hiatal hernia Cameron lesions and diverticulosis - Patient is a [employer] and therefore unable to transfuse - Ferritin noted to be 8-she was given IV iron /Venofer  on 11/19 and 11/20 - Hemoglobin 7.3 - Undergoing video capsule study today- GI recommends f/u in AM  UTI - Grossly positive UA - Unfortunately urine cultures unhelpful and reveals multiple species - The patient has been receiving ceftriaxone  since 11/18  Left renal exophytic lesion - Per radiology report, appears to be concerning for benign cyst    Code Status: Full Code Total time on patient care: 35 minutes DVT prophylaxis:   SCDs    Objective:   Vitals:   06/21/24 1400 06/21/24 1543 06/21/24 2028 06/22/24 0603  BP: (!) 187/88 (!) 152/99 (!) 144/64 135/69  Pulse: 65 78 67 80  Resp: (!) 21  20 18   Temp:   98.2 F (36.8 C) 98.7 F (37.1 C)  TempSrc:   Oral Oral  SpO2: 96% 99% 98% 98%  Weight:      Height:       Filed Weights   06/21/24 1226  Weight: 72.6 kg   Exam: General exam: Appears comfortable  HEENT: oral mucosa moist Respiratory system: Clear to auscultation.  Cardiovascular system: S1 & S2 heard  Gastrointestinal system: Abdomen soft, non-tender, nondistended. Normal bowel sounds   Extremities: No cyanosis, clubbing or edema Psychiatry:  Mood & affect appropriate.      CBC: Recent Labs  Lab 06/17/24 2311 06/19/24 0439 06/20/24 0509 06/21/24 0427 06/22/24 0450  WBC 6.3 5.7 5.6 6.9 6.0  NEUTROABS 3.2  --   --   --  3.5  HGB 9.5* 7.5* 7.3* 7.8* 7.3*  HCT 33.0* 26.4* 26.0* 27.8* 26.3*  MCV 81.3 82.0 83.1 83.2 84.3  PLT 372 285 271 291 285   Basic Metabolic Panel: Recent Labs  Lab 06/17/24 2311 06/19/24 0439 06/20/24 0509 06/21/24 0427 06/22/24 0450  NA 138 139 140 139 136  K 5.0 5.1 4.7 4.2 4.5  CL 106 108 111 108 106  CO2 19* 21* 22 21* 21*  GLUCOSE 91 91 87 73 135*  BUN 28* 26* 22 17  19  CREATININE 2.11* 1.92* 1.91* 1.65* 1.89*  CALCIUM  9.9 9.0 9.4 9.7 9.2     Scheduled Meds:  amLODipine   10 mg Oral Daily   aspirin   81 mg Oral Daily   atorvastatin   10 mg Oral Daily   carvedilol   25 mg Oral BID WC   feeding supplement  1 Container Oral TID BM   Influenza vac split trivalent PF  0.5 mL Intramuscular Tomorrow-1000   melatonin  10 mg Oral QHS   pantoprazole   40 mg Oral Daily   polyethylene glycol  17 g Oral Daily    Imaging and lab data personally reviewed   Author: Sahara Fujimoto  06/22/2024 8:15 AM  To contact [facility]>   Check the care team in [facility] and look for the attending/consulting [facility] provider listed  Log into [email or web address] and use [facility]'s universal password   Go to> [facility]  and find provider  If you still have difficulty reaching the provider, please page the [facility] (Director on Call) for the Hospitalists listed on amion

## 2024-06-22 NOTE — Plan of Care (Signed)

## 2024-06-23 ENCOUNTER — Other Ambulatory Visit: Payer: Self-pay

## 2024-06-23 ENCOUNTER — Encounter (HOSPITAL_COMMUNITY): Payer: Self-pay | Admitting: Gastroenterology

## 2024-06-23 ENCOUNTER — Other Ambulatory Visit (HOSPITAL_COMMUNITY): Payer: Self-pay

## 2024-06-23 DIAGNOSIS — D509 Iron deficiency anemia, unspecified: Secondary | ICD-10-CM | POA: Diagnosis not present

## 2024-06-23 DIAGNOSIS — K449 Diaphragmatic hernia without obstruction or gangrene: Secondary | ICD-10-CM | POA: Diagnosis not present

## 2024-06-23 DIAGNOSIS — E611 Iron deficiency: Secondary | ICD-10-CM | POA: Insufficient documentation

## 2024-06-23 DIAGNOSIS — N179 Acute kidney failure, unspecified: Secondary | ICD-10-CM | POA: Diagnosis not present

## 2024-06-23 LAB — BASIC METABOLIC PANEL WITH GFR
Anion gap: 8 (ref 5–15)
BUN: 17 mg/dL (ref 8–23)
CO2: 22 mmol/L (ref 22–32)
Calcium: 9.1 mg/dL (ref 8.9–10.3)
Chloride: 107 mmol/L (ref 98–111)
Creatinine, Ser: 1.59 mg/dL — ABNORMAL HIGH (ref 0.44–1.00)
GFR, Estimated: 34 mL/min — ABNORMAL LOW (ref >60)
Glucose, Bld: 77 mg/dL (ref 70–99)
Potassium: 4.4 mmol/L (ref 3.5–5.1)
Sodium: 137 mmol/L (ref 135–145)

## 2024-06-23 LAB — CBC
HCT: 27.5 % — ABNORMAL LOW (ref 36.0–46.0)
Hemoglobin: 8 g/dL — ABNORMAL LOW (ref 12.0–15.0)
MCH: 24.2 pg — ABNORMAL LOW (ref 26.0–34.0)
MCHC: 29.1 g/dL — ABNORMAL LOW (ref 30.0–36.0)
MCV: 83.3 fL (ref 80.0–100.0)
Platelets: 251 K/uL (ref 150–400)
RBC: 3.3 MIL/uL — ABNORMAL LOW (ref 3.87–5.11)
RDW: 18.6 % — ABNORMAL HIGH (ref 11.5–15.5)
WBC: 5.6 K/uL (ref 4.0–10.5)
nRBC: 0.4 % — ABNORMAL HIGH (ref 0.0–0.2)

## 2024-06-23 MED ORDER — PANTOPRAZOLE SODIUM 40 MG PO TBEC
40.0000 mg | DELAYED_RELEASE_TABLET | Freq: Every day | ORAL | 0 refills | Status: AC
  Filled 2024-06-23: qty 30, 30d supply, fill #0

## 2024-06-23 MED ORDER — FERROUS GLUCONATE 324 (38 FE) MG PO TABS
324.0000 mg | ORAL_TABLET | Freq: Every day | ORAL | 0 refills | Status: AC
  Filled 2024-06-23: qty 30, 30d supply, fill #0

## 2024-06-23 NOTE — Progress Notes (Signed)
 [facility] ROUNDING NOTE    Cross Cover for Dr. Rollin   Subjective: Patient feeling better today.  She tolerated regular breakfast.  No nausea or vomiting.  Had a large urgent bowel movement after getting MiraLAX  this morning.  Was nonbloody per patient. Hemoglobin stable.  Renal function improving. Capsule endoscopy reviewed this morning and was normal.   Objective: Vital signs in last 24 hours: Temp:  [98.1 F (36.7 C)-98.4 F (36.9 C)] 98.1 F (36.7 C) (11/23 0539) Pulse Rate:  [72-80] 80 (11/23 0539) Resp:  [18] 18 (11/23 0539) BP: (137-170)/(74-82) 170/74 (11/23 0539) SpO2:  [99 %-100 %] 100 % (11/23 0539) Last BM Date : 06/22/24 General: NAD, pleasant African-American female Lungs:  CTA b/l, no w/r/r Heart:  RRR, no m/r/g Abdomen:  Soft, NT, ND, +BS Ext:  No c/c/e    Intake/Output from previous day: 11/22 0701 - 11/23 0700 In: 1080 [P.O.:1080] Out: -  Intake/Output this shift: No intake/output data recorded.   Lab Results: Recent Labs    06/21/24 0427 06/22/24 0450 06/23/24 0440  WBC 6.9 6.0 5.6  HGB 7.8* 7.3* 8.0*  PLT 291 285 251  MCV 83.2 84.3 83.3   BMET Recent Labs    06/21/24 0427 06/22/24 0450 06/23/24 0440  NA 139 136 137  K 4.2 4.5 4.4  CL 108 106 107  CO2 21* 21* 22  GLUCOSE 73 135* 77  BUN 17 19 17   CREATININE 1.65* 1.89* 1.59*  CALCIUM  9.7 9.2 9.1   LFT Recent Labs    06/22/24 0450  PROT 5.9*  ALBUMIN 3.2*  AST 21  ALT 13  ALKPHOS 85  BILITOT <0.2   PT/INR No results for input(s): INR in the last 72 hours.    Imaging/Other results: No results found.    Assessment and Plan:  73 year old female admitted with persistent nausea, vomiting and iron  deficiency anemia.  EGD showed significant interval enlargement of a hiatal hernia, which is a likely source of her nausea and vomiting.  No obvious bleeding sources.  Colonoscopy with diverticulosis, otherwise normal.  Capsule endoscopy normal.  Iron   deficiency anemia -Most likely source would be Cameron's lesions given large hiatal hernia and otherwise negative panendoscopy - Status post IV iron  on 11/19 and 11/20 - Would discharge home on oral iron  if able to tolerate - Continued monitoring of hemoglobin as outpatient.  Patient will be at risk for recurrent chronic blood loss with Cameron's lesions  Large hiatal hernia Patient states that Dr. Rollin placed a referral for her to discuss surgical repair as outpatient -Continue once daily Protonix  as outpatient  Okay to discharge home from GI standpoint.  Patient can follow-up with Dr. Darcus Endoscopy as needed   Glendia FORBES Holt, MD  06/23/2024, 11:21 AM [facility]

## 2024-06-23 NOTE — Plan of Care (Signed)

## 2024-06-23 NOTE — Discharge Summary (Signed)
 Physician Discharge Summary  Tamara Friedman FMW:[id number] DOB: [birth year] DOA: 06/17/2024  PCP: Benjamine Aland, MD  Admit date: 06/17/2024 Discharge date: 06/23/2024 Discharging to: Home Recommendations for Outpatient Follow-up:  She will need iron  and hemoglobin levels followed closely Cardiology did determine if it is safe to hold her aspirin  in setting of severe iron  deficiency and probable chronic GI bleed General surgery referral for hiatal hernia  Consults:  GI Procedures:  EGD and colonoscopy   Discharge Diagnoses:   Principal Problem:   AKI (acute kidney injury) Active Problems:   Intractable nausea and vomiting   Normochromic normocytic anemia   Hiatal hernia   Iron  deficiency     Principal Problem:   AKI (acute kidney injury) CKD stage IIIb - Creatinine 2.11 on admission with baseline about 1.5 - Felt to be secondary to nausea and vomiting - Steadily improved with rehydration   Nausea and vomiting -  Secondary to large hiatal hernia? - resolved   Dark stools with severe iron  deficiency anemia - Has a history of IDA and colon polyps - Hemoglobin 9.5 on admission dropped to 7.5 the following day - GI was consulted and the patient underwent an EGD and colonoscopy on 11/21 - These did not reveal any acute bleeding but she was found to have a 10 cm hiatal hernia Ole lesions and diverticulosis - Video capsule study resulted today and it negative  - Patient is a [employer] and therefore unable to transfuse - Ferritin noted to be 8-she was given IV iron /Venofer  on 11/19 and 11/20- oral Iron  prescribed - GI recommends to continue Protonix  to decreased chronic bleeding from [place] lesions    UTI - Grossly positive UA - Unfortunately urine cultures unhelpful and reveals multiple species - treated with ceftriaxone     Left renal exophytic lesion - Per radiology report, appears to be concerning for benign cyst    Non obstructive CAD - on aspirin  per  cardiology     Discharge Instructions   Allergies as of 06/23/2024   No Known Allergies      Medication List     TAKE these medications    albuterol 108 (90 Base) MCG/ACT inhaler Commonly known as: VENTOLIN HFA Inhale 1-2 puffs into the lungs every 6 (six) hours as needed for wheezing or shortness of breath.   amLODipine  10 MG tablet Commonly known as: NORVASC  Take 1 tablet (10 mg total) by mouth daily.   aspirin  81 MG tablet Take 81 mg by mouth daily.   atorvastatin  10 MG tablet Commonly known as: LIPITOR Take 10 mg by mouth daily.   carvedilol  25 MG tablet Commonly known as: COREG  Take 25 mg by mouth 2 (two) times daily with a meal.   ferrous gluconate  324 MG tablet Commonly known as: FERGON Take 1 tablet (324 mg total) by mouth daily with breakfast.   losartan 50 MG tablet Commonly known as: COZAAR Take 50 mg by mouth daily.   magnesium  hydroxide 400 MG/5ML suspension Commonly known as: MILK OF MAGNESIA Take 30 mLs by mouth daily as needed for mild constipation.   Melatonin 10 MG Tabs Take 10 mg by mouth at bedtime.   Multivitamin Adults Tabs Take 1 tablet by mouth daily after supper.   ondansetron  8 MG disintegrating tablet Commonly known as: ZOFRAN -ODT Take 1 tablet (8 mg total) by mouth every 8 (eight) hours as needed for up to 30 doses for nausea or vomiting.   pantoprazole  40 MG tablet Commonly known as: Protonix  Take 1 tablet (  40 mg total) by mouth daily.   polyethylene glycol 17 g packet Commonly known as: MIRALAX  / GLYCOLAX  Take 17 g by mouth daily.   tiZANidine  4 MG tablet Commonly known as: ZANAFLEX  Take 1 tablet (4 mg total) by mouth every 8 (eight) hours as needed for muscle spasms.            The results of significant diagnostics from this hospitalization (including imaging, microbiology, ancillary and laboratory) are listed below for reference.    CT ABDOMEN PELVIS WO CONTRAST Result Date: 06/18/2024 EXAM: CT ABDOMEN AND  PELVIS WITHOUT CONTRAST 06/18/2024 02:43:50 AM TECHNIQUE: CT of the abdomen and pelvis was performed without the administration of intravenous contrast. Multiplanar reformatted images are provided for review. Automated exposure control, iterative reconstruction, and/or weight-based adjustment of the mA/kV was utilized to reduce the radiation dose to as low as reasonably achievable. COMPARISON: 10/29/2021. CLINICAL HISTORY: RLQ abdominal pain. FINDINGS: LOWER CHEST: Large hiatal hernia with the majority of the stomach within the thoracic compartment. This is unchanged compared to the prior study of 10/29/2021. LIVER: The liver is unremarkable. GALLBLADDER AND BILE DUCTS: Gallbladder is unremarkable. No biliary ductal dilatation. SPLEEN: No acute abnormality. PANCREAS: No acute abnormality. ADRENAL GLANDS: No acute abnormality. KIDNEYS, URETERS AND BLADDER: Hyperdense exophytic 2.9 cm lesion arising from the interpolar region of the left kidney is compatible with hyperdense renal cyst, unchanged compared to 10/29/2021. Additional simple cortical cysts noted within the lower pole. Asymmetric left renal cortical scarring. The kidneys are otherwise unremarkable. Per consensus, no follow-up is needed for simple Bosniak type 1 and 2 renal cysts, unless the patient has a malignancy history or risk factors. No stones in the kidneys or ureters. No hydronephrosis. No perinephric or periureteral stranding. Urinary bladder is unremarkable. GI AND BOWEL: The stomach is largely within the thoracic compartment due to a large hiatal hernia (as described in LOWER CHEST). Severe descending and sigmoid colon diverticulosis, unchanged compared to 10/29/2021. No acute inflammatory change. The small bowel and the remaining portions of the large bowel are unremarkable. Appendix normal. There is no bowel obstruction. PERITONEUM AND RETROPERITONEUM: No ascites. No free air. VASCULATURE: Aorta is normal in caliber. Moderate aortoiliac  atherosclerotic calcification. LYMPH NODES: No lymphadenopathy. REPRODUCTIVE ORGANS: Uterus absent. No adnexal masses. BONES AND SOFT TISSUES: Small fat-containing umbilical hernia. Tiny fat-containing right inguinal hernia. No acute osseous abnormality. IMPRESSION: 1. No acute findings. 2. Large hiatal hernia with the majority of the stomach within the thoracic compartment, unchanged compared to 05/31/2022 3. Severe descending and sigmoid colon diverticulosis without evidence of acute inflammatory change 4. Hyperdense exophytic 2.9 cm lesion arising from the interpolar region of the left kidney compatible with a benign hyperdense/proteinaceous cyst; additional simple cortical cysts in the lower pole. No follow-up imaging recommended per renal cyst guidelines Electronically signed by: Dorethia Molt MD 06/18/2024 03:08 AM EST RP Workstation: [id number]   Labs:   Basic Metabolic Panel: Recent Labs  Lab 06/19/24 0439 06/20/24 0509 06/21/24 0427 06/22/24 0450 06/23/24 0440  NA 139 140 139 136 137  K 5.1 4.7 4.2 4.5 4.4  CL 108 111 108 106 107  CO2 21* 22 21* 21* 22  GLUCOSE 91 87 73 135* 77  BUN 26* 22 17 19 17   CREATININE 1.92* 1.91* 1.65* 1.89* 1.59*  CALCIUM  9.0 9.4 9.7 9.2 9.1     CBC: Recent Labs  Lab 06/17/24 2311 06/19/24 0439 06/20/24 0509 06/21/24 0427 06/22/24 0450 06/23/24 0440  WBC 6.3 5.7 5.6 6.9 6.0 5.6  NEUTROABS  3.2  --   --   --  3.5  --   HGB 9.5* 7.5* 7.3* 7.8* 7.3* 8.0*  HCT 33.0* 26.4* 26.0* 27.8* 26.3* 27.5*  MCV 81.3 82.0 83.1 83.2 84.3 83.3  PLT 372 285 271 291 285 251         SIGNED:   True Atlas, MD  [facility] 06/23/2024, 11:51 AM Time taking on discharge: 50 minutes

## 2024-06-23 NOTE — Progress Notes (Signed)
 Meds to bed delivered to pt's nurse [facility])

## 2024-06-23 NOTE — Progress Notes (Signed)
 Pt called via call bell stating that she needs help getting back into standing position. She stated that her phone fell on the floor and she got out the bed, kneeled down to get the phone, grabbed the phone and placed it on the bed but was unable to get back to a standing position. Pt stated she did not hit her head or did not complaint of any pain. Dr Earley made aware. No new orders.

## 2024-07-11 ENCOUNTER — Other Ambulatory Visit: Payer: Self-pay

## 2024-07-11 ENCOUNTER — Emergency Department (HOSPITAL_COMMUNITY)
Admission: EM | Admit: 2024-07-11 | Discharge: 2024-07-11 | Disposition: A | Discharge status: Home / Self Care | Source: Ambulatory Visit | Attending: Emergency Medicine | Admitting: Emergency Medicine

## 2024-07-11 ENCOUNTER — Emergency Department (HOSPITAL_COMMUNITY)

## 2024-07-11 ENCOUNTER — Encounter (HOSPITAL_COMMUNITY): Payer: Self-pay

## 2024-07-11 DIAGNOSIS — Z79899 Other long term (current) drug therapy: Secondary | ICD-10-CM | POA: Diagnosis not present

## 2024-07-11 DIAGNOSIS — K5900 Constipation, unspecified: Secondary | ICD-10-CM | POA: Insufficient documentation

## 2024-07-11 DIAGNOSIS — N3000 Acute cystitis without hematuria: Secondary | ICD-10-CM | POA: Diagnosis not present

## 2024-07-11 DIAGNOSIS — N189 Chronic kidney disease, unspecified: Secondary | ICD-10-CM | POA: Diagnosis not present

## 2024-07-11 DIAGNOSIS — I129 Hypertensive chronic kidney disease with stage 1 through stage 4 chronic kidney disease, or unspecified chronic kidney disease: Secondary | ICD-10-CM | POA: Diagnosis not present

## 2024-07-11 LAB — CBC
HCT: 38.1 % (ref 36.0–46.0)
Hemoglobin: 11.1 g/dL — ABNORMAL LOW (ref 12.0–15.0)
MCH: 25.6 pg — ABNORMAL LOW (ref 26.0–34.0)
MCHC: 29.1 g/dL — ABNORMAL LOW (ref 30.0–36.0)
MCV: 88 fL (ref 80.0–100.0)
Platelets: 277 K/uL (ref 150–400)
RBC: 4.33 MIL/uL (ref 3.87–5.11)
RDW: 21.2 % — ABNORMAL HIGH (ref 11.5–15.5)
WBC: 5.2 K/uL (ref 4.0–10.5)
nRBC: 0 % (ref 0.0–0.2)

## 2024-07-11 LAB — COMPREHENSIVE METABOLIC PANEL WITH GFR
ALT: 10 U/L (ref 0–44)
AST: 21 U/L (ref 15–41)
Albumin: 4 g/dL (ref 3.5–5.0)
Alkaline Phosphatase: 105 U/L (ref 38–126)
Anion gap: 12 (ref 5–15)
BUN: 21 mg/dL (ref 8–23)
CO2: 21 mmol/L — ABNORMAL LOW (ref 22–32)
Calcium: 9.7 mg/dL (ref 8.9–10.3)
Chloride: 104 mmol/L (ref 98–111)
Creatinine, Ser: 1.58 mg/dL — ABNORMAL HIGH (ref 0.44–1.00)
GFR, Estimated: 34 mL/min — ABNORMAL LOW (ref >60)
Glucose, Bld: 95 mg/dL (ref 70–99)
Potassium: 4.4 mmol/L (ref 3.5–5.1)
Sodium: 137 mmol/L (ref 135–145)
Total Bilirubin: 0.5 mg/dL (ref 0.0–1.2)
Total Protein: 7.4 g/dL (ref 6.5–8.1)

## 2024-07-11 LAB — URINALYSIS, ROUTINE W REFLEX MICROSCOPIC
Bilirubin Urine: NEGATIVE
Glucose, UA: NEGATIVE mg/dL
Ketones, ur: NEGATIVE mg/dL
Nitrite: NEGATIVE
Protein, ur: 30 mg/dL — AB
Specific Gravity, Urine: 1.014 (ref 1.005–1.030)
pH: 5 (ref 5.0–8.0)

## 2024-07-11 LAB — POC OCCULT BLOOD, ED: Fecal Occult Bld: POSITIVE — AB

## 2024-07-11 MED ORDER — CEFPODOXIME PROXETIL 200 MG PO TABS: 200.0000 mg | ORAL_TABLET | Freq: Two times a day (BID) | ORAL | 0 refills | Status: AC

## 2024-07-11 NOTE — ED Notes (Signed)
 The patient reports she feels better. She was able to have a large bowel movement after the enema. The patient is A&OX4, ambulatory at d/c with independent steady gait, NAD. Pt verbalized understanding of d/c instructions, prescription and follow up care.

## 2024-07-11 NOTE — ED Provider Notes (Signed)
 [place] EMERGENCY DEPARTMENT AT [facility] Provider Note   CSN: [id number] Arrival date & time: 07/11/24  1504     Patient presents with: Constipation and Abdominal Pain   Tamara Friedman is a 73 y.o. female with h/o constipation, hypertension, CKD presents to the emergency department today for evaluation of constipation.  The patient reports that her last bowel movement was on Sunday and was very small, hard, and black.  Reports that she had a fecal smear today that was black in coloration as well.  Denies any hematochezia or any bright red blood per rectum.  Reports that she has had some nausea but no vomiting.  Was passing gas until today where she has not had any.  She denies any dysuria, hematuria, urgency, frequency, or difficulty urinating.  She denies any fever.  She had a colonoscopy on November 21 and a follow-up appointment with Dr. Phebe on December 1.  No known drug allergies.   Constipation Associated symptoms: abdominal pain   Associated symptoms: no dysuria and no fever   Abdominal Pain Associated symptoms: constipation   Associated symptoms: no chest pain, no chills, no dysuria, no fever, no hematuria and no shortness of breath        Prior to Admission medications  Medication Sig Start Date End Date Taking? Authorizing Provider  albuterol (VENTOLIN HFA) 108 (90 Base) MCG/ACT inhaler Inhale 1-2 puffs into the lungs every 6 (six) hours as needed for wheezing or shortness of breath.    [provider]  amLODipine  (NORVASC ) 10 MG tablet Take 1 tablet (10 mg total) by mouth daily. 12/30/21   Vann, Jessica U, DO  aspirin  81 MG tablet Take 81 mg by mouth daily.     [provider]  atorvastatin  (LIPITOR) 10 MG tablet Take 10 mg by mouth daily.    [provider]  carvedilol  (COREG ) 25 MG tablet Take 25 mg by mouth 2 (two) times daily with a meal.    [provider]  ferrous gluconate  (FERGON) 324 MG tablet Take 1 tablet (324 mg  total) by mouth daily with breakfast. 06/23/24   Earley Saucer, MD  losartan (COZAAR) 50 MG tablet Take 50 mg by mouth daily. 04/02/24   [provider]  magnesium  hydroxide (MILK OF MAGNESIA) 400 MG/5ML suspension Take 30 mLs by mouth daily as needed for mild constipation. Patient not taking: Reported on 06/18/2024 12/29/21   Vann, Jessica U, DO  Melatonin 10 MG TABS Take 10 mg by mouth at bedtime.    [provider]  Multiple Vitamins-Minerals (MULTIVITAMIN ADULTS) TABS Take 1 tablet by mouth daily after supper. Patient not taking: Reported on 06/18/2024    [provider]  ondansetron  (ZOFRAN -ODT) 8 MG disintegrating tablet Take 1 tablet (8 mg total) by mouth every 8 (eight) hours as needed for up to 30 doses for nausea or vomiting. Patient not taking: Reported on 06/18/2024 06/04/22   Christobal Guadalajara, MD  pantoprazole  (PROTONIX ) 40 MG tablet Take 1 tablet (40 mg total) by mouth daily. 06/23/24 07/23/24  Rizwan, Saima, MD  polyethylene glycol (MIRALAX  / GLYCOLAX ) 17 g packet Take 17 g by mouth daily. Patient not taking: Reported on 06/18/2024 03/16/22   Krishnan, Gokul, MD  tiZANidine  (ZANAFLEX ) 4 MG tablet Take 1 tablet (4 mg total) by mouth every 8 (eight) hours as needed for muscle spasms. Patient not taking: Reported on 06/18/2024 05/12/22   Antoinette Doe, MD    Allergies: Patient has no known allergies.  Review of Systems  Constitutional:  Negative for chills and fever.  Respiratory:  Negative for shortness of breath.   Cardiovascular:  Negative for chest pain.  Gastrointestinal:  Positive for abdominal pain and constipation.  Genitourinary:  Negative for difficulty urinating, dysuria, frequency, hematuria and urgency.    Updated Vital Signs BP (!) 167/84   Pulse 72   Temp 98 F (36.7 C)   Resp 18   SpO2 100%   Physical Exam Vitals and nursing note reviewed. Exam conducted with a chaperone present.  Constitutional:      General: She is not in acute  distress.    Appearance: She is not toxic-appearing.  Eyes:     General: No scleral icterus. Pulmonary:     Effort: Pulmonary effort is normal. No respiratory distress.  Abdominal:     General: Bowel sounds are normal. There is distension.     Palpations: Abdomen is soft.     Tenderness: There is generalized abdominal tenderness. There is no guarding or rebound.     Comments: Abdomen is soft but distended.  Diffuse abdominal tenderness to palpation.  Normal active bowel sounds.  Genitourinary:    Comments: Darker green stool but no frank melena or blood noted on digital rectal examination.  Large stool ball felt in rectal vault. Skin:    General: Skin is warm and dry.  Neurological:     Mental Status: She is alert.     (all labs ordered are listed, but only abnormal results are displayed) Labs Reviewed  COMPREHENSIVE METABOLIC PANEL WITH GFR - Abnormal; Notable for the following components:      Result Value   CO2 21 (*)    Creatinine, Ser 1.58 (*)    GFR, Estimated 34 (*)    All other components within normal limits  CBC - Abnormal; Notable for the following components:   Hemoglobin 11.1 (*)    MCH 25.6 (*)    MCHC 29.1 (*)    RDW 21.2 (*)    All other components within normal limits  URINALYSIS, ROUTINE W REFLEX MICROSCOPIC - Abnormal; Notable for the following components:   APPearance HAZY (*)    Hgb urine dipstick SMALL (*)    Protein, ur 30 (*)    Leukocytes,Ua LARGE (*)    Bacteria, UA RARE (*)    All other components within normal limits  POC OCCULT BLOOD, ED - Abnormal; Notable for the following components:   Fecal Occult Bld POSITIVE (*)    All other components within normal limits  URINE CULTURE    EKG: None  Radiology: CT ABDOMEN PELVIS WO CONTRAST Result Date: 07/11/2024 CLINICAL DATA:  Concern for bowel obstruction. EXAM: CT ABDOMEN AND PELVIS WITHOUT CONTRAST TECHNIQUE: Multidetector CT imaging of the abdomen and pelvis was performed following the  standard protocol without IV contrast. RADIATION DOSE REDUCTION: This exam was performed according to the departmental dose-optimization program which includes automated exposure control, adjustment of the mA and/or kV according to patient size and/or use of iterative reconstruction technique. COMPARISON:  CT abdomen pelvis dated 06/18/2024. FINDINGS: Evaluation of this exam is limited in the absence of intravenous contrast. Lower chest: The visualized lung bases are clear. No intra-abdominal free air or free fluid. Hepatobiliary: The liver is unremarkable. No biliary ductal dilatation. No calcified gallstone. Pancreas: Unremarkable. No pancreatic ductal dilatation or surrounding inflammatory changes. Spleen: Normal in size without focal abnormality. Adrenals/Urinary Tract: The adrenal glands unremarkable. There is no hydronephrosis or nephrolithiasis on either side. Similar appearance  of an exophytic lesion from the lateral mid to upper pole of the left kidney, not characterized on this CT, previously described as cysts. The visualized ureters and urinary bladder appear unremarkable. Stomach/Bowel: There is a large hiatal hernia containing a portion of the stomach. There is moderate stool throughout the colon and within the rectal vault. There is sigmoid diverticulosis. There is no bowel obstruction or active inflammation. The appendix is normal. Vascular/Lymphatic: Moderate aortoiliac atherosclerotic disease. The IVC is unremarkable. No portal venous gas. There is no adenopathy. Reproductive: Hysterectomy.  No suspicious adnexal masses. Other: Small fat containing umbilical hernia. Musculoskeletal: Mild degenerative changes. No acute osseous pathology. IMPRESSION: 1. No acute intra-abdominal or pelvic pathology. No bowel obstruction. Normal appendix. 2. Moderate colonic stool burden. 3. Sigmoid diverticulosis. 4. Large hiatal hernia. 5.  Aortic Atherosclerosis (ICD10-I70.0). Electronically Signed   By: Vanetta Chou M.D.   On: 07/11/2024 21:14    Procedures   Medications Ordered in the ED - No data to display  Medical Decision Making Amount and/or Complexity of Data Reviewed Labs: ordered. Radiology: ordered.  Risk Prescription drug management.   73 y.o. female presents to the ER for evaluation of constipation and abdominal pain. Differential diagnosis includes but is not limited to fecal impaction, stercoral colitis, constipation, small bowel obstruction. Vital signs mildly elevated blood pressure otherwise unremarkable. Physical exam as noted above.   Patient ports that she was passing gas until today.  Has nausea but no vomiting.  No fevers.  No difficulty with urination.  Denies a chest pain or shortness of breath.  She does have a diffusely tender abdomen that is soft but is distended.  Will obtain CT imaging and labs.  I independently reviewed and interpreted the patient's labs.  CMP shows bicarb 21, creatinine 1.58, no other electrolyte or LFT abnormality.  CBC shows a hemoglobin 11.1, does appear increase in patient's baseline.  No leukocytosis.  Fecal occult positive.  Urinalysis hazy urine with small and hemoglobin present.  30 protein with large amount leukocytes with 11-20 white blood cells and rare bacteria.  Will order urine culture.  CT scan shows 1. No acute intra-abdominal or pelvic pathology. No bowel obstruction. Normal appendix. 2. Moderate colonic stool burden. 3. Sigmoid diverticulosis. 4. Large hiatal hernia. 5.  Aortic Atherosclerosis Per radiologist's interpretation.    On my personal independent interpretation of the imaging, it does appear patient does have a fecal impaction. Soap suds enema ordered. The patient was able to have a large bowel movement here and is feeling much better.   Will treat for UTI given patient could have some urine reflux from her stool burden.  She did have a Hemoccult positive which could be from hemorrhoids, she did not have any dark  melena stool on my examination from digital rectal exam.  She is not on any blood thinners and her hemoglobin is seemingly improved from her baseline.  Will have her follow-up with Dr. Rollin.  Recommended MiraLAX  daily with increasing her water intake.  We discussed the results of the labs/imaging. The plan is stool softeners, hydration, antibiotics, follow-up. We discussed strict return precautions and red flag symptoms. The patient verbalized their understanding and agrees to the plan. The patient is stable and being discharged home in good condition.  Portions of this report may have been transcribed using voice recognition software. Every effort was made to ensure accuracy; however, inadvertent computerized transcription errors may be present.    Final diagnoses:  Constipation, unspecified constipation type  Acute cystitis without hematuria    ED Discharge Orders          Ordered    cefpodoxime (VANTIN) 200 MG tablet  2 times daily        07/11/24 2228               Bernis Ernst, PA-C 07/12/24 0008    Tamara Roxie HERO, DO 07/12/24 1655

## 2024-07-11 NOTE — Discharge Instructions (Addendum)
 You were seen in the ER today for evaluation of your constipation. I am glad that you are feeling better. Please make sure that you follow up with Dr. Rollin for your constipation and some possible blood in the stool. For your constipation, please take 1-2 scoops of Miralax  a day. Make sure that you are staying well hydrated as well. Additionally, it looks like you may have a urinary tract infection. I am prescribing you a medication to take for this. I have included additional information for you to review in the paperwork. If you have any concerns, new or worsening symptoms, please return to the nearest ER for re-evaluation.   Contact a health care provider if: You have pain that gets worse. You have a fever. You do not have a bowel movement after 4 days. You vomit. You are not hungry or you lose weight. You are bleeding from the opening between the buttocks (anus). You have thin, pencil-like stools. Get help right away if: You have a fever and your symptoms suddenly get worse. You leak stool or have blood in your stool. Your abdomen is bloated. You have severe pain in your abdomen. You feel dizzy or you faint.

## 2024-07-11 NOTE — ED Triage Notes (Signed)
 Pt hasn't had a BM since Sunday, has taken Malox with no improvement. Pt reports lower abd pain, last BM on Sunday was black/tarry.

## 2024-07-11 NOTE — ED Notes (Signed)
 Patient transported to CT

## 2024-07-12 ENCOUNTER — Telehealth (HOSPITAL_COMMUNITY): Payer: Self-pay | Admitting: Emergency Medicine

## 2024-07-12 NOTE — Telephone Encounter (Signed)
 Pharmacy called as the patient could not afford the Vantin prescription.  I reviewed the chart.  There was slight elevation in creatinine.  We discussed switching this to Keflex  500 mg every 12 for 7 days.  This is much cheaper for the patient and should be suitable given kidney function.

## 2024-07-13 LAB — URINE CULTURE

## 2024-09-04 ENCOUNTER — Ambulatory Visit: Admitting: Podiatry

## 2024-10-29 ENCOUNTER — Ambulatory Visit: Admitting: Podiatry

## 2024-11-01 ENCOUNTER — Ambulatory Visit: Admitting: Podiatry
